# Patient Record
Sex: Male | Born: 1935 | Race: White | Hispanic: No | Marital: Married | Smoking: Never smoker
Health system: Southern US, Community
[De-identification: ages and names within clinical notes are randomized; demographics above are authoritative.]

## PROBLEM LIST (undated history)

## (undated) DIAGNOSIS — I639 Cerebral infarction, unspecified: Secondary | ICD-10-CM

## (undated) DIAGNOSIS — M1990 Unspecified osteoarthritis, unspecified site: Secondary | ICD-10-CM

## (undated) DIAGNOSIS — I10 Essential (primary) hypertension: Secondary | ICD-10-CM

## (undated) DIAGNOSIS — B389 Coccidioidomycosis, unspecified: Secondary | ICD-10-CM

## (undated) DIAGNOSIS — E785 Hyperlipidemia, unspecified: Secondary | ICD-10-CM

## (undated) DIAGNOSIS — H8109 Meniere's disease, unspecified ear: Secondary | ICD-10-CM

## (undated) DIAGNOSIS — N400 Enlarged prostate without lower urinary tract symptoms: Secondary | ICD-10-CM

## (undated) DIAGNOSIS — C4490 Unspecified malignant neoplasm of skin, unspecified: Secondary | ICD-10-CM

## (undated) DIAGNOSIS — K5900 Constipation, unspecified: Secondary | ICD-10-CM

## (undated) DIAGNOSIS — D649 Anemia, unspecified: Secondary | ICD-10-CM

## (undated) DIAGNOSIS — I6529 Occlusion and stenosis of unspecified carotid artery: Secondary | ICD-10-CM

## (undated) HISTORY — DX: Unspecified malignant neoplasm of skin, unspecified: C44.90

## (undated) HISTORY — PX: PROSTATE SURGERY: SHX751

## (undated) HISTORY — DX: Essential (primary) hypertension: I10

## (undated) HISTORY — DX: Unspecified osteoarthritis, unspecified site: M19.90

## (undated) HISTORY — DX: Occlusion and stenosis of unspecified carotid artery: I65.29

## (undated) HISTORY — DX: Meniere's disease, unspecified ear: H81.09

## (undated) HISTORY — DX: Benign prostatic hyperplasia without lower urinary tract symptoms: N40.0

## (undated) HISTORY — DX: Cerebral infarction, unspecified: I63.9

## (undated) HISTORY — PX: CHOLECYSTECTOMY: SHX55

## (undated) HISTORY — DX: Hyperlipidemia, unspecified: E78.5

---

## 2001-02-23 ENCOUNTER — Emergency Department (HOSPITAL_COMMUNITY): Admission: AC | Admit: 2001-02-23 | Discharge: 2001-02-23 | Payer: Self-pay

## 2001-11-09 ENCOUNTER — Ambulatory Visit (HOSPITAL_COMMUNITY): Admission: RE | Admit: 2001-11-09 | Discharge: 2001-11-09 | Payer: Self-pay

## 2002-10-10 HISTORY — PX: BACK SURGERY: SHX140

## 2003-04-17 ENCOUNTER — Ambulatory Visit (HOSPITAL_COMMUNITY): Admission: RE | Admit: 2003-04-17 | Discharge: 2003-04-17 | Payer: Self-pay | Admitting: Gastroenterology

## 2003-04-17 ENCOUNTER — Encounter: Payer: Self-pay | Admitting: Gastroenterology

## 2003-06-30 ENCOUNTER — Encounter: Payer: Self-pay | Admitting: Urology

## 2003-07-02 ENCOUNTER — Encounter (INDEPENDENT_AMBULATORY_CARE_PROVIDER_SITE_OTHER): Payer: Self-pay | Admitting: *Deleted

## 2003-07-02 ENCOUNTER — Encounter: Payer: Self-pay | Admitting: Gastroenterology

## 2003-07-07 ENCOUNTER — Inpatient Hospital Stay (HOSPITAL_COMMUNITY): Admission: RE | Admit: 2003-07-07 | Discharge: 2003-07-11 | Payer: Self-pay | Admitting: Urology

## 2003-07-07 ENCOUNTER — Encounter (INDEPENDENT_AMBULATORY_CARE_PROVIDER_SITE_OTHER): Payer: Self-pay | Admitting: Specialist

## 2003-07-09 ENCOUNTER — Encounter: Payer: Self-pay | Admitting: Internal Medicine

## 2003-08-10 ENCOUNTER — Inpatient Hospital Stay (HOSPITAL_COMMUNITY): Admission: EM | Admit: 2003-08-10 | Discharge: 2003-08-11 | Payer: Self-pay | Admitting: Emergency Medicine

## 2003-11-16 ENCOUNTER — Emergency Department (HOSPITAL_COMMUNITY): Admission: EM | Admit: 2003-11-16 | Discharge: 2003-11-16 | Payer: Self-pay | Admitting: Family Medicine

## 2004-10-03 ENCOUNTER — Inpatient Hospital Stay (HOSPITAL_COMMUNITY): Admission: EM | Admit: 2004-10-03 | Discharge: 2004-10-07 | Payer: Self-pay | Admitting: Emergency Medicine

## 2004-10-05 ENCOUNTER — Encounter (INDEPENDENT_AMBULATORY_CARE_PROVIDER_SITE_OTHER): Payer: Self-pay | Admitting: Cardiology

## 2005-07-02 ENCOUNTER — Emergency Department (HOSPITAL_COMMUNITY): Admission: AC | Admit: 2005-07-02 | Discharge: 2005-07-03 | Payer: Self-pay

## 2005-07-07 ENCOUNTER — Ambulatory Visit: Payer: Self-pay

## 2005-10-21 ENCOUNTER — Ambulatory Visit: Payer: Self-pay

## 2005-10-28 ENCOUNTER — Ambulatory Visit: Payer: Self-pay

## 2008-08-07 ENCOUNTER — Emergency Department (HOSPITAL_COMMUNITY): Admission: EM | Admit: 2008-08-07 | Discharge: 2008-08-07 | Payer: Self-pay | Admitting: Emergency Medicine

## 2009-09-21 ENCOUNTER — Inpatient Hospital Stay (HOSPITAL_COMMUNITY): Admission: EM | Admit: 2009-09-21 | Discharge: 2009-09-28 | Payer: Self-pay | Admitting: Emergency Medicine

## 2009-09-30 ENCOUNTER — Encounter (INDEPENDENT_AMBULATORY_CARE_PROVIDER_SITE_OTHER): Payer: Self-pay | Admitting: *Deleted

## 2009-10-23 ENCOUNTER — Telehealth: Payer: Self-pay | Admitting: Internal Medicine

## 2009-10-28 ENCOUNTER — Ambulatory Visit: Payer: Self-pay | Admitting: Gastroenterology

## 2009-10-28 DIAGNOSIS — Z860100 Personal history of colon polyps, unspecified: Secondary | ICD-10-CM | POA: Insufficient documentation

## 2009-10-29 ENCOUNTER — Telehealth (INDEPENDENT_AMBULATORY_CARE_PROVIDER_SITE_OTHER): Payer: Self-pay | Admitting: *Deleted

## 2009-11-02 ENCOUNTER — Ambulatory Visit: Payer: Self-pay | Admitting: Gastroenterology

## 2010-01-09 ENCOUNTER — Inpatient Hospital Stay (HOSPITAL_COMMUNITY): Admission: EM | Admit: 2010-01-09 | Discharge: 2010-01-13 | Payer: Self-pay | Admitting: Emergency Medicine

## 2010-01-11 ENCOUNTER — Encounter (INDEPENDENT_AMBULATORY_CARE_PROVIDER_SITE_OTHER): Payer: Self-pay | Admitting: Internal Medicine

## 2010-01-11 ENCOUNTER — Ambulatory Visit: Payer: Self-pay | Admitting: Vascular Surgery

## 2010-01-12 ENCOUNTER — Ambulatory Visit: Payer: Self-pay | Admitting: Physical Medicine & Rehabilitation

## 2010-01-13 ENCOUNTER — Ambulatory Visit: Payer: Self-pay | Admitting: Physical Medicine & Rehabilitation

## 2010-01-13 ENCOUNTER — Inpatient Hospital Stay (HOSPITAL_COMMUNITY)
Admission: RE | Admit: 2010-01-13 | Discharge: 2010-01-26 | Payer: Self-pay | Admitting: Physical Medicine & Rehabilitation

## 2010-02-19 ENCOUNTER — Encounter
Admission: RE | Admit: 2010-02-19 | Discharge: 2010-05-13 | Payer: Self-pay | Admitting: Physical Medicine & Rehabilitation

## 2010-02-23 ENCOUNTER — Ambulatory Visit: Payer: Self-pay | Admitting: Physical Medicine & Rehabilitation

## 2010-02-23 ENCOUNTER — Ambulatory Visit (HOSPITAL_COMMUNITY)
Admission: RE | Admit: 2010-02-23 | Discharge: 2010-02-23 | Payer: Self-pay | Admitting: Physical Medicine & Rehabilitation

## 2010-03-12 ENCOUNTER — Ambulatory Visit: Payer: Self-pay | Admitting: Physical Medicine & Rehabilitation

## 2010-05-13 ENCOUNTER — Ambulatory Visit: Payer: Self-pay | Admitting: Physical Medicine & Rehabilitation

## 2010-10-25 ENCOUNTER — Encounter
Admission: RE | Admit: 2010-10-25 | Discharge: 2010-11-09 | Payer: Self-pay | Source: Home / Self Care | Attending: Physical Medicine & Rehabilitation | Admitting: Physical Medicine & Rehabilitation

## 2010-10-26 ENCOUNTER — Ambulatory Visit
Admission: RE | Admit: 2010-10-26 | Discharge: 2010-10-26 | Payer: Self-pay | Source: Home / Self Care | Attending: Physical Medicine & Rehabilitation | Admitting: Physical Medicine & Rehabilitation

## 2010-11-03 ENCOUNTER — Encounter
Admission: RE | Admit: 2010-11-03 | Discharge: 2010-11-09 | Payer: Self-pay | Source: Home / Self Care | Attending: Physical Medicine & Rehabilitation | Admitting: Physical Medicine & Rehabilitation

## 2010-11-11 NOTE — Procedures (Signed)
Summary: Colon   Colonoscopy  Procedure date:  07/02/2003  Findings:      Location:  [facility].   Patient Name: Gregory Padilla, Gregory Padilla MRN:  Procedure Procedures: Colonoscopy CPT: [phone].    with Hot Biopsy(s)CPT: [id number].    with polypectomy. CPT: A3573898.  Personnel: Endoscopist: Ulyess Mort, MD.  Referred By: Loma Sender, MD.  Exam Location: Exam performed in Outpatient Clinic. Outpatient  Patient Consent: Procedure, Alternatives, Risks and Benefits discussed, consent obtained, from patient. Consent was obtained by the RN.  Indications  Surveillance of: Adenomatous Polyp(s).  History  Pre-Exam Physical: Entire physical exam was normal.  Exam Exam: Extent of exam reached: Cecum, extent intended: Cecum.  The cecum was identified by appendiceal orifice and IC valve. Colon retroflexion performed. Images were not taken. ASA Classification: II. Tolerance: good.  Monitoring: Pulse and BP monitoring, Oximetry used. Supplemental O2 given.  Colon Prep Prep results: fair, adequate exam.  Sedation Meds: Patient assessed and found to be appropriate for moderate (conscious) sedation. Fentanyl 50 mcg. given IV. Versed 5 mg. given IV.  Findings - DIVERTICULOSIS: Ascending Colon to Sigmoid Colon. ICD9: Diverticulosis: 562.10.  POLYP: Ascending Colon, Maximum size: 6 mm. sessile polyp. Procedure:  hot biopsy, removed, retrieved, Polyp sent to pathology. ICD9: Colon Polyps: 211.3.  - HEMORRHOIDS: Internal and External. Size: Grade II. ICD9: Hemorrhoids, Internal and  External: 455.6.   Assessment Abnormal examination, see findings above.  Diagnoses: 211.3: Colon Polyps.  562.10: Diverticulosis.  455.6: Hemorrhoids, Internal and  External.   Events  Unplanned Interventions: No intervention was required.  Unplanned Events: There were no complications. Plans Patient Education: Patient given standard instructions for: Polyps. Diverticulosis. Yearly  hemoccult testing recommended. Patient instructed to get routine colonoscopy every 4 years.  Disposition: After procedure patient sent to recovery. After recovery patient sent home.

## 2010-11-11 NOTE — Progress Notes (Signed)
Summary: Triage  Phone Note Call from Patient Call back at Home Phone [phone]   Caller: Patient Call For: Dr. Marina Goodell Reason for Call: Talk to Nurse Summary of Call: Pt is needing to schedule a Hospital f/u with Dr. Marina Goodell ASAP Initial call taken by: Karna Christmas,  October 23, 2009 11:34 AM  Follow-up for Phone Call        Discussed with Dr.Jasneet Schobert and he has never seen pt. so he should keep scheduled appt. with Dr.Jacobs. Christy the front office scheduler will contact the pt. Follow-up by: Teryl Lucy RN,  October 23, 2009 3:04 PM

## 2010-11-11 NOTE — Procedures (Signed)
Summary: Colonoscopy  Patient: [name] Note: All result statuses are Final unless otherwise noted.  Tests: (1) Colonoscopy (COL)   COL Colonoscopy           DONE     [facility]     [address]     [place], [place]  [address]           COLONOSCOPY PROCEDURE REPORT           PATIENT:  Gregory Padilla, Gregory Padilla  MR#:  [id number]     BIRTHDATE:  Jul 07, 1925, 84 yrs. old  GENDER:  male           ENDOSCOPIST:  Rachael Fee, MD     Referred by:  Loma Sender, MD           PROCEDURE DATE:  11/02/2009     PROCEDURE:  Colonoscopy, Diagnostic     ASA CLASS:  Class II     INDICATIONS:  chronic constipation, personal history of     precancerous colon polyps, recently admitted with partial SBO     (Obstipation?)           MEDICATIONS:   Fentanyl 50 mcg IV, Versed 5 mg IV           DESCRIPTION OF PROCEDURE:   After the risks benefits and     alternatives of the procedure were thoroughly explained, informed     consent was obtained.  Digital rectal exam was performed and     revealed no rectal masses.   The LB PCF-H180AL [id number] endoscope     was introduced through the anus and advanced to the cecum, which     was identified by both the appendix and ileocecal valve, without     limitations.  The quality of the prep was adequate, using     MoviPrep.  The instrument was then slowly withdrawn as the colon     was fully examined.     <<PROCEDUREIMAGES>>           FINDINGS:  Moderate diverticulosis was found throughout the colon.     This was most significant in left colon, associated with some     mucusal edema and mild erythema (see image2).  Internal     hemorrhoids were found. These were small, non-thrombosed. There     was a small internal anal skin tag as well (see image5).  This was     otherwise a normal examination of the colon (see image3 and     image4).   Retroflexed views in the rectum revealed no     abnormalities.    The scope was then withdrawn from the  patient     and the procedure completed.           COMPLICATIONS:  None           ENDOSCOPIC IMPRESSION:     1) Moderate diverticulosis throughout the colon     2) Internal hemorrhoids     3) Otherwise normal examination; no polyps or cancers           RECOMMENDATIONS:     1) Return to the care of your primary provider. GI follow up as     needed     2) Continue miralax, metamucil, prune juice to keep you bowels     moving easier.           REPEAT EXAM:  does not need routine colonoscopy for polyp  surveillance or cancer screening.           ______________________________     Rachael Fee, MD           n.     eSIGNED:   Rachael Fee at 11/02/2009 09:39 AM           Deborah Chalk, [id number]  Note: An exclamation mark (!) indicates a result that was not dispersed into the flowsheet. Document Creation Date: 11/02/2009 9:39 AM _______________________________________________________________________  (1) Order result status: Final Collection or observation date-time: 11/02/2009 09:32 Requested date-time:  Receipt date-time:  Reported date-time:  Referring Physician:   Ordering Physician: Rob Bunting [phone]) Specimen Source:  Source: Launa Grill Order Number: [id number] Lab site:

## 2010-11-11 NOTE — Procedures (Signed)
Summary: EGD   EGD  Procedure date:  07/02/2003  Findings:      Location: [facility]    EGD  Procedure date:  07/02/2003  Findings:      Location: [facility]   Patient Name: Masiyah, Engen MRN:  Procedure Procedures: Panendoscopy (EGD) CPT: 43235.    with biopsy(s)/brushing(s). CPT: [id number].  Personnel: Endoscopist: Ulyess Mort, MD.  Referred By: Loma Sender, MD.  Exam Location: Exam performed in Outpatient Clinic. Outpatient  Patient Consent: Procedure, Alternatives, Risks and Benefits discussed, consent obtained, from patient. Consent was obtained by the RN.  Indications Symptoms: Abdominal pain, Reflux symptoms  History  Pre-Exam Physical: Entire physical exam was normal.  Exam Exam Info: Maximum depth of insertion Duodenum, intended Duodenum. Images were not taken. ASA Classification: II.  Sedation Meds: Patient assessed and found to be appropriate for moderate (conscious) sedation.  Monitoring: BP and pulse monitoring done. Oximetry used. Supplemental O2 given  Findings - ANATOMICAL DEFORMITY: Distal Esophagus. mild presbyesophagous.  - MUCOSAL ABNORMALITY: Antrum to Jejunum. Erythematous mucosa. Granular mucosa.  - MUCOSAL ABNORMALITY: Fundus to Antrum. Erythematous mucosa. Granular mucosa. Biopsy/Mucosal Abn taken. RUT done, results pending. ICD9: Gastritis, Chronic: 535.10. Comment: multiple gastric polyps bx,s =5.   Assessment Abnormal examination, see findings above.  Diagnoses: 535.10: Gastritis, Chronic.   Events  Unplanned Intervention: No unplanned interventions were required.  Unplanned Events: There were no complications. Plans Medication(s): Continue current medications.  Patient Education: Patient given standard instructions for: Reflux. Mucosal Abnormality.  Disposition: After procedure patient sent to recovery. After recovery patient sent home.

## 2010-11-11 NOTE — Assessment & Plan Note (Signed)
History of Present Illness Visit Type: Initial Visit Primary GI MD: Rob Bunting MD Primary Provider: Adelfa Koh Requesting Provider: n/a Chief Complaint: Hospital F/U, Pt is doing better now History of Present Illness:     very pleasant 77 year old man who was recently discharged from [facility] after 7 day stay for partial small bowel obstruction.  plain films and CAT scan showed no clear transition point but it was noted that he was fairly constipated. His partial blockage resolved with conservative measures, NG tube suction gradually over one week's time. He did not require surgical intervention.  since discharge he has felt back to his usual which is chronic constipation with chronic intermittent right lower quadrant pains. has right lower quadrant discomfort  for years, still has it.  usually relieved with BM.  overall his weigth is stable for past year.  no post prandial symptoms unless he "overeats".    takes metamucil twice a day.  20 oz prune juice daily.  on this regmine he controls his bowels pretty well.  Will need miralax periodically as well.  he has seen no blood in his stool he is quite hard of hearing and his wife helps with his history.  she had a colonoscopy in 2004 by Dr. Victorino Dike, the indications were for "history of adenomatous polyps" be removed at least one small polyp at that time and this was adenomatous by pathology.           Current Medications (verified): 1)  Alprazolam 0.25 Mg Tabs (Alprazolam) .... As Needed 2)  Flomax 0.4 Mg Caps (Tamsulosin Hcl) .Marland Kitchen.. 1 Two Times A Day 3)  Nexium 40 Mg Cpdr (Esomeprazole Magnesium) .Marland Kitchen.. 1 By Mouth Once Daily 4)  Dicyclomine Hcl 10 Mg Caps (Dicyclomine Hcl) .Marland Kitchen.. 1 -3 Daily As Needed 5)  Guaifenex Pse/1200sa .... For Allergies 1 Two Times A Day As Needed 6)  Crestor 5 Mg Tabs (Rosuvastatin Calcium) .Marland Kitchen.. 1 By Mouth Once Daily 7)  Aggrenox 25-200 Mg Xr12h-Cap (Aspirin-Dipyridamole) .Marland Kitchen.. 1 By Mouth Two  Times A Day 8)  Udamin Sp  Tabs (Specialty Vitamins Products) .Marland Kitchen.. 1 By Mouth Two Times A Day 9)  Gelnique 10 % Gel (Oxybutynin Chloride) .... Every Am  Allergies (verified): No Known Drug Allergies  Past History:  Past Medical History: Adenomatous Colon Polyps Arthritis Diabetes Hyperlipidemia GERD Peptic Ulcer Disease Hypertension Benign Prostatic hypertrophy Irritable Bowel Syndrome Diverticulitis Lactose Intolerance   Past Surgical History: Cholecystectomy in 10s  Family History: mother had pancreatic cancer  Social History: he is married, he has 2 children, he is retired, he does not smoke cigarettes drink alcohol or caffeine  Review of Systems       Pertinent positive and negative review of systems were noted in the above HPI and GI specific review of systems.  All other review of systems was otherwise negative.   Vital Signs:  Patient profile:   77 year old male Height:      71 inches Weight:      177 pounds BMI:     24.78 BSA:     2.00 Pulse rate:   80 / minute Pulse rhythm:   regular BP sitting:   110 / 62  (left arm)  Vitals Entered By: Merri Ray CMA Duncan Dull) (October 28, 2009 9:01 AM)  Physical Exam  Additional Exam:  Constitutional: generally well appearing Psychiatric: alert and oriented times 3 Eyes: extraocular movements intact Mouth: oropharynx moist, no lesions Neck: supple, no lymphadenopathy Cardiovascular: heart regular rate and  rythm Lungs: CTA bilaterally Abdomen: soft, non-tender, non-distended, no obvious ascites, no peritoneal signs, normal bowel sounds Extremities: no lower extremity edema bilaterally Skin: no lesions on visible extremities    Impression & Recommendations:  Problem # 1:  chronic constipation, intermittent right lower quadrant pains, recent resolved partial small bowel obstruction he has had cholecystectomy in the past and so perhaps adhesions from that surgery caused his partial small bowel  obstruction. He is also chronically constipated and that can certainly cause an ileus, obstipation type situation which can lead to small bowel dilation. He has a history of precancerous colon polyps I think repeating colonoscopy at this point is reasonable. We will arrange for this to be done at his soonest convenience. Otherwise he will continue on his daily Metamucil, prune juice, intermittent MiraLax regimen and he is been doing for many years with fairly good results.  Patient Instructions: 1)  You will be scheduled to have a colonoscopy. OK to stay on aggrenox prior to the procedure. 2)  Continue metamucil, prune juice, miralax as needed. 3)  A copy of this information will be sent to Dr. Loma Sender. 4)  The medication list was reviewed and reconciled.  All changed / newly prescribed medications were explained.  A complete medication list was provided to the patient / caregiver.  Appended Document: Orders Update/movi    Clinical Lists Changes  Problems: Added new problem of PERSONAL HISTORY OF COLONIC POLYPS (ICD-V12.72) Medications: Added new medication of MOVIPREP 100 GM  SOLR (PEG-KCL-NACL-NASULF-NA ASC-C) As per prep instructions. - Signed Rx of MOVIPREP 100 GM  SOLR (PEG-KCL-NACL-NASULF-NA ASC-C) As per prep instructions.;  #1 x 0;  Signed;  Entered by: Chales Abrahams CMA (AAMA);  Authorized by: Rachael Fee MD;  Method used: Electronically to [facility]. [facility] [phone]*, [address], [place], [place]  [id number], Ph: [phone], Fax: [phone] Orders: Added new Test order of Colonoscopy (Colon) - Signed    Prescriptions: MOVIPREP 100 GM  SOLR (PEG-KCL-NACL-NASULF-NA ASC-C) As per prep instructions.  #1 x 0   Entered by:   Chales Abrahams CMA (AAMA)   Authorized by:   Rachael Fee MD   Signed by:   Chales Abrahams CMA (AAMA) on 10/28/2009   Method used:   Electronically to        [facility]. [address] [phone]* (retail)       [address] [place],  [place]  [id number]       Ph: [phone]       Fax: [phone]   RxID:   [id number]

## 2010-11-11 NOTE — Progress Notes (Signed)
Summary: Colon time change  Phone Note Outgoing Call Call back at [facility] Phone [phone]   Call placed by: Chales Abrahams CMA Duncan Dull),  October 29, 2009 2:27 PM Summary of Call: called and had pt appt moved up to 930 on Mon and corrected instructions with the pt wife she verbally understood the changes. Initial call taken by: Chales Abrahams CMA Duncan Dull),  October 29, 2009 2:28 PM

## 2010-11-11 NOTE — Letter (Signed)
Summary: [facility] Instructions  [facility]  [address] [place], [place] [address]   Phone: [phone]  Fax: [phone]       Gregory Padilla    1924-10-15    MRN: [id number]        Procedure Day /Date:11/02/09     Arrival Time:1230 pm     Procedure Time:1:30 pm     Location of Procedure:                    X  [facility] (4th Floor)                        PREPARATION FOR COLONOSCOPY WITH MOVIPREP   Starting 5 days prior to your procedure TODAY do not eat nuts, seeds, popcorn, corn, beans, peas,  salads, or any raw vegetables.  Do not take any fiber supplements (e.g. Metamucil, Citrucel, and Benefiber).  THE DAY BEFORE YOUR PROCEDURE         DATE: 11/01/09  DAY: SUN  1.  Drink clear liquids the entire day-NO SOLID FOOD  2.  Do not drink anything colored red or purple.  Avoid juices with pulp.  No orange juice.  3.  Drink at least 64 oz. (8 glasses) of fluid/clear liquids during the day to prevent dehydration and help the prep work efficiently.  CLEAR LIQUIDS INCLUDE: Water Jello Ice Popsicles Tea (sugar ok, no milk/cream) Powdered fruit flavored drinks Coffee (sugar ok, no milk/cream) Gatorade Juice: apple, white grape, white cranberry  Lemonade Clear bullion, consomm, broth Carbonated beverages (any kind) Strained chicken noodle soup Hard Candy                             4.  In the morning, mix first dose of MoviPrep solution:    Empty 1 Pouch A and 1 Pouch B into the disposable container    Add lukewarm drinking water to the top line of the container. Mix to dissolve    Refrigerate (mixed solution should be used within 24 hrs)  5.  Begin drinking the prep at 5:00 p.m. The MoviPrep container is divided by 4 marks.   Every 15 minutes drink the solution down to the next mark (approximately 8 oz) until the full liter is complete.   6.  Follow completed prep with 16 oz of clear liquid of your choice (Nothing red or purple).   Continue to drink clear liquids until bedtime.  7.  Before going to bed, mix second dose of MoviPrep solution:    Empty 1 Pouch A and 1 Pouch B into the disposable container    Add lukewarm drinking water to the top line of the container. Mix to dissolve    Refrigerate  THE DAY OF YOUR PROCEDURE      DATE: 11/02/09 DAY: MON  Beginning at 830 a.m. (5 hours before procedure):         1. Every 15 minutes, drink the solution down to the next mark (approx 8 oz) until the full liter is complete.  2. Follow completed prep with 16 oz. of clear liquid of your choice.    3. You may drink clear liquids until 1130 am(2 HOURS BEFORE PROCEDURE).   MEDICATION INSTRUCTIONS  Unless otherwise instructed, you should take regular prescription medications with a small sip of water   as early as possible the morning of your procedure.    STAY  Aggrenox on per Dr Christella Hartigan           OTHER INSTRUCTIONS  You will need a responsible adult at least 75 years of age to accompany you and drive you home.   This person must remain in the waiting room during your procedure.  Wear loose fitting clothing that is easily removed.  Leave jewelry and other valuables at home.  However, you may wish to bring a book to read or  an iPod/MP3 player to listen to music as you wait for your procedure to start.  Remove all body piercing jewelry and leave at home.  Total time from sign-in until discharge is approximately 2-3 hours.  You should go home directly after your procedure and rest.  You can resume normal activities the  day after your procedure.  The day of your procedure you should not:   Drive   Make legal decisions   Operate machinery   Drink alcohol   Return to work  You will receive specific instructions about eating, activities and medications before you leave.    The above instructions have been reviewed and explained to me by   _______________________    I fully understand and can  verbalize these instructions _____________________________ Date _________

## 2010-11-12 ENCOUNTER — Ambulatory Visit
Payer: MEDICARE | Attending: Physical Medicine & Rehabilitation | Admitting: Rehabilitative and Restorative Service Providers"

## 2010-11-12 DIAGNOSIS — I69998 Other sequelae following unspecified cerebrovascular disease: Secondary | ICD-10-CM | POA: Insufficient documentation

## 2010-11-12 DIAGNOSIS — IMO0001 Reserved for inherently not codable concepts without codable children: Secondary | ICD-10-CM | POA: Insufficient documentation

## 2010-11-12 DIAGNOSIS — R269 Unspecified abnormalities of gait and mobility: Secondary | ICD-10-CM | POA: Insufficient documentation

## 2010-11-15 ENCOUNTER — Ambulatory Visit: Payer: MEDICARE | Admitting: Rehabilitative and Restorative Service Providers"

## 2010-11-17 ENCOUNTER — Ambulatory Visit: Payer: MEDICARE | Admitting: Rehabilitative and Restorative Service Providers"

## 2010-11-23 ENCOUNTER — Encounter: Payer: Self-pay | Admitting: Rehabilitative and Restorative Service Providers"

## 2010-11-26 ENCOUNTER — Ambulatory Visit: Payer: MEDICARE | Admitting: Rehabilitative and Restorative Service Providers"

## 2010-11-30 ENCOUNTER — Ambulatory Visit: Payer: MEDICARE | Admitting: Rehabilitative and Restorative Service Providers"

## 2010-12-03 ENCOUNTER — Ambulatory Visit: Payer: MEDICARE | Admitting: Rehabilitative and Restorative Service Providers"

## 2010-12-07 ENCOUNTER — Ambulatory Visit: Payer: MEDICARE | Admitting: Rehabilitative and Restorative Service Providers"

## 2010-12-13 ENCOUNTER — Ambulatory Visit
Payer: MEDICARE | Attending: Physical Medicine & Rehabilitation | Admitting: Rehabilitative and Restorative Service Providers"

## 2010-12-13 DIAGNOSIS — IMO0001 Reserved for inherently not codable concepts without codable children: Secondary | ICD-10-CM | POA: Insufficient documentation

## 2010-12-13 DIAGNOSIS — R269 Unspecified abnormalities of gait and mobility: Secondary | ICD-10-CM | POA: Insufficient documentation

## 2010-12-13 DIAGNOSIS — I69998 Other sequelae following unspecified cerebrovascular disease: Secondary | ICD-10-CM | POA: Insufficient documentation

## 2010-12-16 ENCOUNTER — Encounter: Payer: MEDICARE | Admitting: Rehabilitative and Restorative Service Providers"

## 2010-12-28 ENCOUNTER — Ambulatory Visit: Payer: Self-pay | Admitting: Physical Medicine & Rehabilitation

## 2010-12-28 LAB — GLUCOSE, CAPILLARY
Glucose-Capillary: 125 mg/dL — ABNORMAL HIGH (ref 70–99)
Glucose-Capillary: 126 mg/dL — ABNORMAL HIGH (ref 70–99)
Glucose-Capillary: 134 mg/dL — ABNORMAL HIGH (ref 70–99)
Glucose-Capillary: 134 mg/dL — ABNORMAL HIGH (ref 70–99)
Glucose-Capillary: 139 mg/dL — ABNORMAL HIGH (ref 70–99)
Glucose-Capillary: 140 mg/dL — ABNORMAL HIGH (ref 70–99)
Glucose-Capillary: 141 mg/dL — ABNORMAL HIGH (ref 70–99)
Glucose-Capillary: 145 mg/dL — ABNORMAL HIGH (ref 70–99)
Glucose-Capillary: 159 mg/dL — ABNORMAL HIGH (ref 70–99)
Glucose-Capillary: 170 mg/dL — ABNORMAL HIGH (ref 70–99)
Glucose-Capillary: 176 mg/dL — ABNORMAL HIGH (ref 70–99)

## 2010-12-29 LAB — DIFFERENTIAL
Basophils Absolute: 0 10*3/uL (ref 0.0–0.1)
Basophils Absolute: 0 10*3/uL (ref 0.0–0.1)
Basophils Relative: 0 % (ref 0–1)
Basophils Relative: 0 % (ref 0–1)
Eosinophils Absolute: 0.2 10*3/uL (ref 0.0–0.7)
Eosinophils Absolute: 0.3 10*3/uL (ref 0.0–0.7)
Eosinophils Relative: 3 % (ref 0–5)
Lymphocytes Relative: 20 % (ref 12–46)
Lymphs Abs: 1.9 10*3/uL (ref 0.7–4.0)
Monocytes Absolute: 1.3 10*3/uL — ABNORMAL HIGH (ref 0.1–1.0)
Neutrophils Relative %: 65 % (ref 43–77)

## 2010-12-29 LAB — GLUCOSE, CAPILLARY
Glucose-Capillary: 114 mg/dL — ABNORMAL HIGH (ref 70–99)
Glucose-Capillary: 117 mg/dL — ABNORMAL HIGH (ref 70–99)
Glucose-Capillary: 128 mg/dL — ABNORMAL HIGH (ref 70–99)
Glucose-Capillary: 128 mg/dL — ABNORMAL HIGH (ref 70–99)
Glucose-Capillary: 129 mg/dL — ABNORMAL HIGH (ref 70–99)
Glucose-Capillary: 135 mg/dL — ABNORMAL HIGH (ref 70–99)
Glucose-Capillary: 139 mg/dL — ABNORMAL HIGH (ref 70–99)
Glucose-Capillary: 142 mg/dL — ABNORMAL HIGH (ref 70–99)
Glucose-Capillary: 143 mg/dL — ABNORMAL HIGH (ref 70–99)
Glucose-Capillary: 148 mg/dL — ABNORMAL HIGH (ref 70–99)
Glucose-Capillary: 148 mg/dL — ABNORMAL HIGH (ref 70–99)
Glucose-Capillary: 149 mg/dL — ABNORMAL HIGH (ref 70–99)
Glucose-Capillary: 149 mg/dL — ABNORMAL HIGH (ref 70–99)
Glucose-Capillary: 151 mg/dL — ABNORMAL HIGH (ref 70–99)
Glucose-Capillary: 152 mg/dL — ABNORMAL HIGH (ref 70–99)
Glucose-Capillary: 163 mg/dL — ABNORMAL HIGH (ref 70–99)
Glucose-Capillary: 168 mg/dL — ABNORMAL HIGH (ref 70–99)
Glucose-Capillary: 168 mg/dL — ABNORMAL HIGH (ref 70–99)
Glucose-Capillary: 169 mg/dL — ABNORMAL HIGH (ref 70–99)
Glucose-Capillary: 180 mg/dL — ABNORMAL HIGH (ref 70–99)
Glucose-Capillary: 192 mg/dL — ABNORMAL HIGH (ref 70–99)
Glucose-Capillary: 193 mg/dL — ABNORMAL HIGH (ref 70–99)
Glucose-Capillary: 214 mg/dL — ABNORMAL HIGH (ref 70–99)

## 2010-12-29 LAB — PROTEIN C ACTIVITY: Protein C Activity: 148 % — ABNORMAL HIGH (ref 75–133)

## 2010-12-29 LAB — BASIC METABOLIC PANEL
BUN: 10 mg/dL (ref 6–23)
BUN: 8 mg/dL (ref 6–23)
Calcium: 9.2 mg/dL (ref 8.4–10.5)
Chloride: 104 mEq/L (ref 96–112)
Chloride: 105 mEq/L (ref 96–112)
Creatinine, Ser: 1.14 mg/dL (ref 0.4–1.5)
Creatinine, Ser: 1.18 mg/dL (ref 0.4–1.5)
Glucose, Bld: 91 mg/dL (ref 70–99)

## 2010-12-29 LAB — COMPREHENSIVE METABOLIC PANEL
ALT: 18 U/L (ref 0–53)
AST: 23 U/L (ref 0–37)
Albumin: 3.7 g/dL (ref 3.5–5.2)
Alkaline Phosphatase: 53 U/L (ref 39–117)
BUN: 10 mg/dL (ref 6–23)
GFR calc non Af Amer: >60 mL/min (ref >60)
Glucose, Bld: 148 mg/dL — ABNORMAL HIGH (ref 70–99)
Total Bilirubin: 1 mg/dL (ref 0.3–1.2)

## 2010-12-29 LAB — URINALYSIS, ROUTINE W REFLEX MICROSCOPIC
Bilirubin Urine: NEGATIVE
Bilirubin Urine: NEGATIVE
Hgb urine dipstick: NEGATIVE
Hgb urine dipstick: NEGATIVE
Ketones, ur: NEGATIVE mg/dL
Ketones, ur: NEGATIVE mg/dL
Nitrite: NEGATIVE
Protein, ur: NEGATIVE mg/dL
Specific Gravity, Urine: 1.008 (ref 1.005–1.030)
Specific Gravity, Urine: 1.017 (ref 1.005–1.030)
Urobilinogen, UA: 0.2 mg/dL (ref 0.0–1.0)
Urobilinogen, UA: 0.2 mg/dL (ref 0.0–1.0)
pH: 6 (ref 5.0–8.0)

## 2010-12-29 LAB — CBC
HCT: 37.8 % — ABNORMAL LOW (ref 39.0–52.0)
Hemoglobin: 12.8 g/dL — ABNORMAL LOW (ref 13.0–17.0)
MCHC: 33.9 g/dL (ref 30.0–36.0)
MCV: 98.3 fL (ref 78.0–100.0)
MCV: 98.5 fL (ref 78.0–100.0)
Platelets: 185 10*3/uL (ref 150–400)
Platelets: 222 10*3/uL (ref 150–400)
RDW: 12.2 % (ref 11.5–15.5)
RDW: 12.2 % (ref 11.5–15.5)
WBC: 9.1 10*3/uL (ref 4.0–10.5)
WBC: 9.8 10*3/uL (ref 4.0–10.5)

## 2010-12-29 LAB — URINE CULTURE: Colony Count: 50000

## 2010-12-29 LAB — HEPATIC FUNCTION PANEL
ALT: 23 U/L (ref 0–53)
AST: 27 U/L (ref 0–37)
Albumin: 3.9 g/dL (ref 3.5–5.2)
Total Protein: 6.5 g/dL (ref 6.0–8.3)

## 2010-12-29 LAB — APTT
aPTT: 32 seconds (ref 24–37)
aPTT: 35 seconds (ref 24–37)

## 2010-12-29 LAB — LUPUS ANTICOAGULANT PANEL
DRVVT: 41.2 secs (ref 36.2–44.3)
PTT Lupus Anticoagulant: 40.2 secs (ref 32.0–43.4)

## 2010-12-29 LAB — CARDIOLIPIN ANTIBODIES, IGG, IGM, IGA
Anticardiolipin IgA: 0 APL U/mL — ABNORMAL LOW (ref <22)
Anticardiolipin IgM: 65 MPL U/mL — ABNORMAL HIGH (ref <11)

## 2010-12-29 LAB — PROTIME-INR
INR: 1.03 (ref 0.00–1.49)
Prothrombin Time: 13.4 seconds (ref 11.6–15.2)

## 2010-12-29 LAB — PROTEIN C, TOTAL: Protein C, Total: 105 % (ref 70–140)

## 2010-12-29 LAB — LIPID PANEL
HDL: 60 mg/dL (ref >39)
LDL Cholesterol: 67 mg/dL (ref 0–99)
Triglycerides: 75 mg/dL (ref <150)
VLDL: 15 mg/dL (ref 0–40)

## 2010-12-29 LAB — PROTHROMBIN GENE MUTATION

## 2010-12-29 LAB — ANA: Anti Nuclear Antibody(ANA): NEGATIVE

## 2010-12-29 LAB — HOMOCYSTEINE: Homocysteine: 8.1 umol/L (ref 4.0–15.4)

## 2010-12-29 LAB — URINE MICROSCOPIC-ADD ON

## 2010-12-29 LAB — SEDIMENTATION RATE: Sed Rate: 3 mm/hr (ref 0–16)

## 2010-12-29 LAB — VITAMIN B12: Vitamin B-12: 1253 pg/mL — ABNORMAL HIGH (ref 211–911)

## 2010-12-29 LAB — ANTITHROMBIN III: AntiThromb III Func: 88 % (ref 76–126)

## 2011-01-07 ENCOUNTER — Ambulatory Visit: Payer: MEDICARE | Admitting: Physical Medicine & Rehabilitation

## 2011-01-07 ENCOUNTER — Encounter: Payer: MEDICARE | Attending: Physical Medicine & Rehabilitation

## 2011-01-07 DIAGNOSIS — H8110 Benign paroxysmal vertigo, unspecified ear: Secondary | ICD-10-CM | POA: Insufficient documentation

## 2011-01-07 DIAGNOSIS — R269 Unspecified abnormalities of gait and mobility: Secondary | ICD-10-CM | POA: Insufficient documentation

## 2011-01-07 DIAGNOSIS — M2240 Chondromalacia patellae, unspecified knee: Secondary | ICD-10-CM | POA: Insufficient documentation

## 2011-01-07 DIAGNOSIS — I6330 Cerebral infarction due to thrombosis of unspecified cerebral artery: Secondary | ICD-10-CM

## 2011-01-07 DIAGNOSIS — M25569 Pain in unspecified knee: Secondary | ICD-10-CM | POA: Insufficient documentation

## 2011-01-07 DIAGNOSIS — Z8673 Personal history of transient ischemic attack (TIA), and cerebral infarction without residual deficits: Secondary | ICD-10-CM | POA: Insufficient documentation

## 2011-01-10 LAB — CBC
MCV: 98.5 fL (ref 78.0–100.0)
RBC: 3.51 MIL/uL — ABNORMAL LOW (ref 4.22–5.81)
WBC: 8.2 10*3/uL (ref 4.0–10.5)

## 2011-01-10 LAB — BASIC METABOLIC PANEL
Calcium: 8.8 mg/dL (ref 8.4–10.5)
Chloride: 103 mEq/L (ref 96–112)
Creatinine, Ser: 1.13 mg/dL (ref 0.4–1.5)
GFR calc Af Amer: >60 mL/min (ref >60)

## 2011-01-10 LAB — MAGNESIUM: Magnesium: 2.7 mg/dL — ABNORMAL HIGH (ref 1.5–2.5)

## 2011-01-11 LAB — CBC
HCT: 39.3 % (ref 39.0–52.0)
Hemoglobin: 12 g/dL — ABNORMAL LOW (ref 13.0–17.0)
Hemoglobin: 13.4 g/dL (ref 13.0–17.0)
MCHC: 33.8 g/dL (ref 30.0–36.0)
MCV: 98.5 fL (ref 78.0–100.0)
MCV: 98.7 fL (ref 78.0–100.0)
Platelets: 176 10*3/uL (ref 150–400)
Platelets: 195 10*3/uL (ref 150–400)
RBC: 4 MIL/uL — ABNORMAL LOW (ref 4.22–5.81)
RDW: 11.9 % (ref 11.5–15.5)
WBC: 10.6 10*3/uL — ABNORMAL HIGH (ref 4.0–10.5)
WBC: 12 10*3/uL — ABNORMAL HIGH (ref 4.0–10.5)

## 2011-01-11 LAB — BASIC METABOLIC PANEL
Calcium: 9 mg/dL (ref 8.4–10.5)
Chloride: 95 mEq/L — ABNORMAL LOW (ref 96–112)
GFR calc Af Amer: >60 mL/min (ref >60)
GFR calc non Af Amer: 52 mL/min — ABNORMAL LOW (ref >60)
Glucose, Bld: 108 mg/dL — ABNORMAL HIGH (ref 70–99)
Potassium: 3.6 mEq/L (ref 3.5–5.1)
Potassium: 4.4 mEq/L (ref 3.5–5.1)
Sodium: 135 mEq/L (ref 135–145)

## 2011-01-11 LAB — COMPREHENSIVE METABOLIC PANEL
AST: 26 U/L (ref 0–37)
Albumin: 3.7 g/dL (ref 3.5–5.2)
CO2: 26 mEq/L (ref 19–32)
Calcium: 9.4 mg/dL (ref 8.4–10.5)
Creatinine, Ser: 1.21 mg/dL (ref 0.4–1.5)
GFR calc Af Amer: >60 mL/min (ref >60)
GFR calc non Af Amer: 57 mL/min — ABNORMAL LOW (ref >60)

## 2011-01-11 LAB — DIFFERENTIAL
Eosinophils Absolute: 0.1 10*3/uL (ref 0.0–0.7)
Eosinophils Relative: 1 % (ref 0–5)
Lymphs Abs: 1.2 10*3/uL (ref 0.7–4.0)
Monocytes Relative: 11 % (ref 3–12)

## 2011-01-11 LAB — PHOSPHORUS: Phosphorus: 2.7 mg/dL (ref 2.3–4.6)

## 2011-01-17 ENCOUNTER — Ambulatory Visit
Payer: MEDICARE | Attending: Physical Medicine & Rehabilitation | Admitting: Rehabilitative and Restorative Service Providers"

## 2011-01-17 DIAGNOSIS — IMO0001 Reserved for inherently not codable concepts without codable children: Secondary | ICD-10-CM | POA: Insufficient documentation

## 2011-01-17 DIAGNOSIS — R269 Unspecified abnormalities of gait and mobility: Secondary | ICD-10-CM | POA: Insufficient documentation

## 2011-01-17 DIAGNOSIS — I69998 Other sequelae following unspecified cerebrovascular disease: Secondary | ICD-10-CM | POA: Insufficient documentation

## 2011-02-25 NOTE — Op Note (Signed)
NAME:  Gregory Padilla, Gregory Padilla                      ACCOUNT NO.:  [id number]   MEDICAL RECORD NO.:  [id number]                   PATIENT TYPE:  AMB   LOCATION:  DAY                                  FACILITY:  [facility]   PHYSICIAN:  Sigmund I. Patsi Sears, M.D.         DATE OF BIRTH:  [birth year]   DATE OF PROCEDURE:  07/07/2003  DATE OF DISCHARGE:                                 OPERATIVE REPORT   PREOPERATIVE DIAGNOSIS:  Benign prostatic hyperplasia/bladder outlet  obstruction.   POSTOPERATIVE DIAGNOSIS:  Benign prostatic hyperplasia/bladder outlet  obstruction.   PROCEDURE:  Cystoscopy, suprapubic tube placement, transurethral resection  of the prostate.   SURGEON:  Sigmund I. Patsi Sears, M.D.   ASSISTANT:  Susanne Borders, MD   ANESTHESIA:  General endotracheal anesthesia.   DRAINS:  26 French three way hematuria catheter with continuous bladder  irrigation.   SPECIMENS:  Prostate chips for pathology.   COMPLICATIONS:  None.   ESTIMATED BLOOD LOSS:  100 mL.   DISPOSITION:  To post-anesthesia care unit in stable condition.   INDICATIONS FOR PROCEDURE:  The patient is a 77 year old gentleman with a  history of significant lower urinary tract symptoms.  The patient has noted  nocturia, decreased force of stream and dribbling.  The patient has tried  Flomax, but this has not been successful.  The patient underwent a  transrectal ultrasound which demonstrated an approximately 80 gram prostate.  Because of the size of his prostate and significant symptoms and his failure  to improve on medical therapy, the patient wished to have a surgical  procedure for treatment of his benign prostatic hyperplasia. After careful  counseling and understanding all of the risks and benefits, the patient  decided on transurethral resection of the prostate.  He also consented for  placement of a suprapubic tube in the event that he is not urinate  postoperatively.   DESCRIPTION OF PROCEDURE:  The  patient was brought to the operating room  where he was correctly identified by his identification bracelet.  He was  given general endotracheal anesthesia and preoperative antibiotics.  He was  placed in the dorsal lithotomy position and his genitalia were prepped and  draped in the typical sterile fashion.  Cystoscopy was performed first with  a 22 French cystoscope using a 12 degree lens.  The anterior urethra was  within normal limits.  The posterior urethra demonstrated a markedly  elongated and large prostate gland.  The patient had significant trilobar  hypertrophy with a median lobe that protruded into the bladder.  The bladder  was entered and demonstrated 2+ trabeculation.  Systematic survey of the  bladder mucosa demonstrated no mucosal abnormalities, tumors or foreign  bodies.  The bilateral ureteral orifices were seen in their normal anatomic  location well away from the site of planned resection.  The patient's  bladder was filled until it could easily be palpated just above the pubic  bone.  The cystocope was removed  and a Lowsley tractor was carefully placed  through the urethra and into the bladder.  The tractor was positioned such  that it was palpated just above the symphysis pubis.  A 15 blade knife was  then used to cut down on the tip of the Lowsley tractor.  The tractor was  then opened and a 16 [place] Foley catheter was grasped and pulled through  the urethra to the urethral meatus.  The tractor was disconnected and the  Foley catheter was backed into the bladder until a flow was obtained through  the catheter.  The balloon was then inflated to 10 mL.  Next, a 28 French  resectoscope sheath was placed using the obturator.  The resectoscope was  placed through the sheath and the resection was begun.  Initial resection  was carried out from approximately 5:00 to 7:00.  The bulk of the median  lobe was resected with care taken not to resect near the ureteral orifices.   The distal extent of the resection was to a level just a couple of mm away  from the verumontanum.  The cutting current was used primarily with  coagulation current used for any small bleeding vessels.  Next, the left  side of the prostate was resected from approximately 12:00 to 6:00.  The  resection was carried out down to the capsule.  There was no venous capsular  bleeding noted.  Resection was then performed in a similar fashion on the  right side of the prostate using cutting current and coagulation current as  necessary.  A very large amount of prostate was resected, as the patient has  a known very large prostate gland.  After both sides of the prostate were  satisfactorily resected, the chips were irrigated out of the bladder.  The  resectoscope was replaced and any bleeding was carefully coagulated.  Having  completed the resection and having stopped all bleeding the bladder was  inspected one final time for any more chips and none were found.  The  resectoscope was then removed and a 24 [place] three way hematuria catheter  was placed and irrigated to clear.  The Foley catheter was put on mild  traction and connected to continuous bladder irrigation with normal saline.  It should be noted that glycine was used throughout the procedure as the  irrigant.  The irrigation flowing from the three way hematuria catheter was  clear and the patient was allowed to awaken from his procedure and did so  without complications.  Please note that Dr. Patsi Sears was present and  participated in all aspects of this case as he is the primary surgeon.     Susanne Borders, MD                           Sigmund I. Patsi Sears, M.D.    DR/MEDQ  D:  07/07/2003  T:  07/07/2003  Job:  [id number]

## 2011-02-25 NOTE — Discharge Summary (Signed)
Gregory Padilla, Gregory Padilla            ACCOUNT NO.:  [id number]   MEDICAL RECORD NO.:  [id number]          PATIENT TYPE:  INP   LOCATION:  3018                         FACILITY:  [facility]   PHYSICIAN:  Pramod P. Pearlean Brownie, MD    DATE OF BIRTH:  1925/01/29   DATE OF ADMISSION:  10/03/2004  DATE OF DISCHARGE:  10/07/2004                                 DISCHARGE SUMMARY   ADMISSION DIAGNOSIS:  Stroke.   DISCHARGE DIAGNOSES:  1.  Small left parietal and occipital middle cerebral artery branch infarcts      due to intracranial arteriosclerosis.  2.  Diabetes.  3.  Hyperlipidemia.   HISTORY OF PRESENT ILLNESS:  Gregory Padilla is a 77 year old Caucasian male  who developed sudden onset of right-sided body weakness and numbness at 10  a.m. with breakfast.  His symptoms had started improving after  __________  bringing him to the hospital.  A code stroke had been called en route.  The  patient was not considered a candidate for ________ intervention due to  quick resolution of symptoms. When evaluated in the emergency room, he  complained of slight heaviness on his right side but no major weakness.  A  CT scan of the head showed no acute  __________.  A small left parietal  infarct was noted.  Admission labs unremarkable.   HOSPITAL COURSE:  He was admitted to the stroke service for risk  stratification work-up and subsequently underwent MRI scan of the brain on  October 04, 2004 which showed tiny acute infarcts in the left parietal and  occipital lobe adjacent to the old moderate-sized infarct with  encephalomalacia in that area.  There was also a possible extension of a  tiny acute infarction on the posterior left frontal lobe.  There were mild  changes of cervical spondylosis seen.  MRA of the intracranial circulation  showed moderate arteriosclerotic changes mainly in the small peripheral  branches.  MRA of the neck revealed a smooth long segment 50 to 60% stenosis  of the left internal  carotid artery. A 2-D echocardiogram showed normal  ejection fraction without obvious cardiac source of embolism.  Carotid  ultrasound showed only a 40 to 60% left internal carotid artery stenosis.  Transcranial Doppler studies unremarkable.  Lipid profile showed a mildly  elevated LDL of 109 and hemoglobin of A1C of 7.2.  Homocysteine was normal.  The patient was started on Aggrenox for secondary stroke prevention and  advised to follow up with his family physician, Gregory Padilla for treatment  for his borderline hyperlipidemia and diabetes.  He was also advised to take  a diabetic low fat diet.   DISCHARGE MEDICATIONS:  1.  Aggrenox 1 capsule daily for two weeks to increase to twice a day if      tolerated.  2.  Dicyclomine 10 mg three times daily.  3.  Xanax 0.25 mg three times daily as needed.  4.  Omeprazole 20 mg once a day.  5.  UdaminSP one capsule three times daily.  6.  Guaifenesin 600/__________ 120 mg twice daily.   FOLLOWUP:  The patient was instructed  to see Gregory Padilla in two weeks and  Dr. Pearlean Brownie in his office in two months.      PPS/MEDQ  D:  12/26/2004  T:  12/27/2004  Job:  [id number]   cc:   Gregory Padilla in [place], MD

## 2011-02-25 NOTE — H&P (Signed)
NAMEJEYSON, DESHOTEL            ACCOUNT NO.:  [id number]   MEDICAL RECORD NO.:  [id number]          PATIENT TYPE:  INP   LOCATION:  3018                         FACILITY:  [facility]   PHYSICIAN:  Pramod P. Pearlean Brownie, MD    DATE OF BIRTH:  1925/03/26   DATE OF ADMISSION:  10/03/2004  DATE OF DISCHARGE:                                HISTORY & PHYSICAL   HISTORY OF PRESENT ILLNESS:  Mr. Flessner is a 77 year old, Caucasian male  who developed sudden onset of right upper and lower extremity weakness while  having breakfast about 10 a.m. today.  He also noticed numbness on the right  side.  He had trouble gripping his cup of coffee and lifting his shoulders.  He also noticed trouble walking.  His wife called 911 and by the time EMS  got there, his symptoms had starting improving and had resolved completely  by the time he reached the emergency room.  He stated his weakness lasted  only 5 minutes.  A code stroke was called by the EMS en route, however,  since the patient improved substantially, he was not considered a candidate  for aggressive intervention or thrombolysis.  The patient denied any  symptoms in the form of headache, blurred vision or vertigo.  He does have a  prior history of similar episodes 2 years ago and he has right-sided  weakness and fell down.  This happened twice within a week, but he did not  seek any medical intervention.  Two to three months ago, he also stated that  he had problems with weakness.  He did fall down, but was able to get up  immediately.   PAST MEDICAL HISTORY:  1.  Borderline diabetes which is diet-controlled.  2.  Benign prostatic hypertrophy.  3.  Gastroesophageal reflux disease.  4.  Chronic Meniere's disease.   PAST SURGICAL HISTORY:  1.  Gallbladder surgery.  2.  Prostate surgery.   MEDICATIONS:  Xanax and Prevacid.   ALLERGIES:  No known drug allergies.   SOCIAL HISTORY:  The patient is retired.  He use to work for [employer].  He lives  with his wife.  His family physician is Dr. Vear Clock in [place].   REVIEW OF SYSTEMS:  Not significant for chest pain, palpitations, cough,  shortness of breath or diarrhea.   PHYSICAL EXAMINATION:  GENERAL:  Pleasant, Caucasian who is not in distress.  VITAL SIGNS:  Afebrile, pulse 78 per minute, regular sinus rhythm,  respirations 18, blood pressure in sitting position 137/84.  HEENT:  Normocephalic, atraumatic.  Decreased hearing bilaterally.  NECK:  Supple.  CARDIAC:  No murmur or gallop.  LUNGS:  Clear to auscultation.  ABDOMEN:  Soft, nontender.  Surgical scar from prior gallbladder surgery  seen on the abdomen.  CHEST:  Upper chest reveals multiple skin lesions consistent with melanomas.  NEUROLOGIC:  Oriented, alert and cooperative.  There is no dysarthria.  Follows three-step commands well.  He has intact recall.  Pupils are  irregular, but reactive.  Face is symmetric.  Tongue is midline.  He has  decreased hearing bilaterally with subjective tinnitus.  Motor exam reveals  no upper extremity drift, symmetric tone, coordination and sensation.  Finger-to-nose coordination accurate.  Sensation is normal.  Plantars are  both downgoing.  Reflexes symmetric.  Gait was not tested.   LABORATORY DATA AND X-RAY FINDINGS:  Noncontrast CAT scan of the head done  today reveals no acute abnormality, old left parietal hypodensities noted  consistent with old infarct.   CBC significantly only for a low hematocrit of 36.  Basic metabolic panel  and cardiac enzymes normal.  EKG shows normal sinus rhythm without acute  abnormalities.   IMPRESSION:  A 77 year old gentleman with left hemispheric transient  ischemic attacks with known previous history of transient ischemic attacks  with left parietal infarct who has not had a neurovascular evaluation to  date.   PLAN:  1.  Admit the patient for stroke risk stratification workup to stroke unit.  2.  Telemetry monitoring.  3.  Check MRI  scan of the brain with MRA of the brain and neck.  4.  Carotid Doppler studies.  5.  Fasting lipid profile with hemoglobin A1C and homocystine.  6.  Start aspirin for stroke prevention for now until workup is completed.  7.  I had a long discussion with the patient, his wife and son answering all      questions.       PPS/MEDQ  D:  10/03/2004  T:  10/04/2004  Job:  [id number]   cc:   Vear Clock, M.D.  [place], [place]

## 2011-03-30 ENCOUNTER — Other Ambulatory Visit: Payer: Self-pay | Admitting: Urology

## 2011-03-30 ENCOUNTER — Other Ambulatory Visit (HOSPITAL_COMMUNITY): Payer: Self-pay | Admitting: Urology

## 2011-03-30 ENCOUNTER — Ambulatory Visit (HOSPITAL_COMMUNITY)
Admission: RE | Admit: 2011-03-30 | Discharge: 2011-03-30 | Disposition: A | Discharge status: Home / Self Care | Payer: Medicare Other | Source: Ambulatory Visit | Attending: Urology | Admitting: Urology

## 2011-03-30 ENCOUNTER — Encounter (HOSPITAL_COMMUNITY): Payer: Medicare Other

## 2011-03-30 DIAGNOSIS — Z01812 Encounter for preprocedural laboratory examination: Secondary | ICD-10-CM | POA: Insufficient documentation

## 2011-03-30 DIAGNOSIS — Z01818 Encounter for other preprocedural examination: Secondary | ICD-10-CM

## 2011-03-30 DIAGNOSIS — N320 Bladder-neck obstruction: Secondary | ICD-10-CM | POA: Insufficient documentation

## 2011-03-30 DIAGNOSIS — E119 Type 2 diabetes mellitus without complications: Secondary | ICD-10-CM | POA: Insufficient documentation

## 2011-03-30 DIAGNOSIS — M47814 Spondylosis without myelopathy or radiculopathy, thoracic region: Secondary | ICD-10-CM | POA: Insufficient documentation

## 2011-03-30 LAB — CBC
HCT: 37.7 % — ABNORMAL LOW (ref 39.0–52.0)
Hemoglobin: 12.8 g/dL — ABNORMAL LOW (ref 13.0–17.0)
MCH: 31.5 pg (ref 26.0–34.0)
RBC: 4.06 MIL/uL — ABNORMAL LOW (ref 4.22–5.81)

## 2011-03-30 LAB — BASIC METABOLIC PANEL
BUN: 9 mg/dL (ref 6–23)
CO2: 26 mEq/L (ref 19–32)
Glucose, Bld: 96 mg/dL (ref 70–99)
Potassium: 4.4 mEq/L (ref 3.5–5.1)
Sodium: 137 mEq/L (ref 135–145)

## 2011-03-30 LAB — SURGICAL PCR SCREEN: Staphylococcus aureus: NEGATIVE

## 2011-04-04 ENCOUNTER — Other Ambulatory Visit: Payer: Self-pay | Admitting: Urology

## 2011-04-04 ENCOUNTER — Ambulatory Visit (HOSPITAL_COMMUNITY)
Admission: RE | Admit: 2011-04-04 | Discharge: 2011-04-05 | Disposition: A | Discharge status: Home / Self Care | Payer: Medicare Other | Source: Ambulatory Visit | Attending: Urology | Admitting: Urology

## 2011-04-04 ENCOUNTER — Ambulatory Visit: Payer: Medicare Other | Admitting: Physical Medicine & Rehabilitation

## 2011-04-04 DIAGNOSIS — K219 Gastro-esophageal reflux disease without esophagitis: Secondary | ICD-10-CM | POA: Insufficient documentation

## 2011-04-04 DIAGNOSIS — R351 Nocturia: Secondary | ICD-10-CM | POA: Insufficient documentation

## 2011-04-04 DIAGNOSIS — N401 Enlarged prostate with lower urinary tract symptoms: Secondary | ICD-10-CM | POA: Insufficient documentation

## 2011-04-04 DIAGNOSIS — N138 Other obstructive and reflux uropathy: Secondary | ICD-10-CM | POA: Insufficient documentation

## 2011-04-04 DIAGNOSIS — Z8673 Personal history of transient ischemic attack (TIA), and cerebral infarction without residual deficits: Secondary | ICD-10-CM | POA: Insufficient documentation

## 2011-04-04 DIAGNOSIS — E119 Type 2 diabetes mellitus without complications: Secondary | ICD-10-CM | POA: Insufficient documentation

## 2011-04-04 DIAGNOSIS — R3915 Urgency of urination: Secondary | ICD-10-CM | POA: Insufficient documentation

## 2011-04-04 DIAGNOSIS — Z79899 Other long term (current) drug therapy: Secondary | ICD-10-CM | POA: Insufficient documentation

## 2011-04-04 LAB — GLUCOSE, CAPILLARY
Glucose-Capillary: 126 mg/dL — ABNORMAL HIGH (ref 70–99)
Glucose-Capillary: 126 mg/dL — ABNORMAL HIGH (ref 70–99)

## 2011-04-05 ENCOUNTER — Inpatient Hospital Stay (HOSPITAL_COMMUNITY)
Admission: EM | Admit: 2011-04-05 | Discharge: 2011-04-08 | DRG: 985 | Disposition: A | Discharge status: Home / Self Care | Payer: Medicare Other | Attending: Family Medicine | Admitting: Family Medicine

## 2011-04-05 DIAGNOSIS — N401 Enlarged prostate with lower urinary tract symptoms: Secondary | ICD-10-CM | POA: Diagnosis present

## 2011-04-05 DIAGNOSIS — N390 Urinary tract infection, site not specified: Secondary | ICD-10-CM | POA: Diagnosis present

## 2011-04-05 DIAGNOSIS — I6529 Occlusion and stenosis of unspecified carotid artery: Secondary | ICD-10-CM | POA: Diagnosis present

## 2011-04-05 DIAGNOSIS — Z8673 Personal history of transient ischemic attack (TIA), and cerebral infarction without residual deficits: Secondary | ICD-10-CM

## 2011-04-05 DIAGNOSIS — W19XXXA Unspecified fall, initial encounter: Secondary | ICD-10-CM | POA: Diagnosis present

## 2011-04-05 DIAGNOSIS — Z7902 Long term (current) use of antithrombotics/antiplatelets: Secondary | ICD-10-CM

## 2011-04-05 DIAGNOSIS — R5383 Other fatigue: Secondary | ICD-10-CM | POA: Diagnosis present

## 2011-04-05 DIAGNOSIS — Y92009 Unspecified place in unspecified non-institutional (private) residence as the place of occurrence of the external cause: Secondary | ICD-10-CM

## 2011-04-05 DIAGNOSIS — R4789 Other speech disturbances: Secondary | ICD-10-CM | POA: Diagnosis present

## 2011-04-05 DIAGNOSIS — D649 Anemia, unspecified: Secondary | ICD-10-CM | POA: Diagnosis present

## 2011-04-05 DIAGNOSIS — N138 Other obstructive and reflux uropathy: Secondary | ICD-10-CM | POA: Diagnosis present

## 2011-04-05 DIAGNOSIS — Y998 Other external cause status: Secondary | ICD-10-CM

## 2011-04-05 DIAGNOSIS — E860 Dehydration: Secondary | ICD-10-CM | POA: Diagnosis present

## 2011-04-05 DIAGNOSIS — Z7982 Long term (current) use of aspirin: Secondary | ICD-10-CM

## 2011-04-05 DIAGNOSIS — E119 Type 2 diabetes mellitus without complications: Secondary | ICD-10-CM | POA: Diagnosis present

## 2011-04-05 DIAGNOSIS — I6350 Cerebral infarction due to unspecified occlusion or stenosis of unspecified cerebral artery: Principal | ICD-10-CM | POA: Diagnosis present

## 2011-04-05 DIAGNOSIS — R3915 Urgency of urination: Secondary | ICD-10-CM | POA: Diagnosis present

## 2011-04-05 DIAGNOSIS — R5381 Other malaise: Secondary | ICD-10-CM | POA: Diagnosis present

## 2011-04-05 LAB — GLUCOSE, CAPILLARY
Glucose-Capillary: 126 mg/dL — ABNORMAL HIGH (ref 70–99)
Glucose-Capillary: 125 mg/dL — ABNORMAL HIGH (ref 70–99)

## 2011-04-06 ENCOUNTER — Inpatient Hospital Stay (HOSPITAL_COMMUNITY): Payer: Medicare Other

## 2011-04-06 ENCOUNTER — Emergency Department (HOSPITAL_COMMUNITY): Payer: Medicare Other

## 2011-04-06 DIAGNOSIS — I059 Rheumatic mitral valve disease, unspecified: Secondary | ICD-10-CM

## 2011-04-06 LAB — CK TOTAL AND CKMB (NOT AT ARMC)
CK, MB: 3.6 ng/mL (ref 0.3–4.0)
Total CK: 157 U/L (ref 7–232)

## 2011-04-06 LAB — HEMOGLOBIN A1C: Mean Plasma Glucose: 148 mg/dL — ABNORMAL HIGH (ref <117)

## 2011-04-06 LAB — APTT: aPTT: 36 seconds (ref 24–37)

## 2011-04-06 LAB — DIFFERENTIAL
Eosinophils Absolute: 0.4 10*3/uL (ref 0.0–0.7)
Eosinophils Relative: 4 % (ref 0–5)
Lymphs Abs: 0.5 10*3/uL — ABNORMAL LOW (ref 0.7–4.0)
Monocytes Absolute: 1.3 10*3/uL — ABNORMAL HIGH (ref 0.1–1.0)
Monocytes Relative: 13 % — ABNORMAL HIGH (ref 3–12)

## 2011-04-06 LAB — CBC
HCT: 33.9 % — ABNORMAL LOW (ref 39.0–52.0)
HCT: 34.4 % — ABNORMAL LOW (ref 39.0–52.0)
Hemoglobin: 11.7 g/dL — ABNORMAL LOW (ref 13.0–17.0)
Hemoglobin: 11.9 g/dL — ABNORMAL LOW (ref 13.0–17.0)
RBC: 3.69 MIL/uL — ABNORMAL LOW (ref 4.22–5.81)
WBC: 8.3 10*3/uL (ref 4.0–10.5)
WBC: 9.7 10*3/uL (ref 4.0–10.5)

## 2011-04-06 LAB — PROTIME-INR
INR: 1.21 (ref 0.00–1.49)
Prothrombin Time: 15.6 seconds — ABNORMAL HIGH (ref 11.6–15.2)

## 2011-04-06 LAB — BASIC METABOLIC PANEL
BUN: 10 mg/dL (ref 6–23)
CO2: 23 mEq/L (ref 19–32)
GFR calc non Af Amer: 56 mL/min — ABNORMAL LOW (ref >60)
Glucose, Bld: 133 mg/dL — ABNORMAL HIGH (ref 70–99)
Potassium: 3.8 mEq/L (ref 3.5–5.1)
Sodium: 131 mEq/L — ABNORMAL LOW (ref 135–145)

## 2011-04-06 LAB — COMPREHENSIVE METABOLIC PANEL
Albumin: 3.5 g/dL (ref 3.5–5.2)
Alkaline Phosphatase: 55 U/L (ref 39–117)
BUN: 10 mg/dL (ref 6–23)
Chloride: 101 mEq/L (ref 96–112)
Creatinine, Ser: 1.24 mg/dL (ref 0.50–1.35)
GFR calc Af Amer: >60 mL/min (ref >60)
Glucose, Bld: 119 mg/dL — ABNORMAL HIGH (ref 70–99)
Potassium: 3.9 mEq/L (ref 3.5–5.1)
Total Bilirubin: 0.3 mg/dL (ref 0.3–1.2)
Total Protein: 5.9 g/dL — ABNORMAL LOW (ref 6.0–8.3)

## 2011-04-06 LAB — GLUCOSE, CAPILLARY
Glucose-Capillary: 93 mg/dL (ref 70–99)
Glucose-Capillary: 131 mg/dL — ABNORMAL HIGH (ref 70–99)
Glucose-Capillary: 142 mg/dL — ABNORMAL HIGH (ref 70–99)

## 2011-04-06 LAB — URINE MICROSCOPIC-ADD ON

## 2011-04-06 LAB — LIPID PANEL
Cholesterol: 128 mg/dL (ref 0–200)
Triglycerides: 124 mg/dL (ref <150)
VLDL: 25 mg/dL (ref 0–40)

## 2011-04-06 LAB — URINALYSIS, ROUTINE W REFLEX MICROSCOPIC
Bilirubin Urine: NEGATIVE
Glucose, UA: NEGATIVE mg/dL
Glucose, UA: NEGATIVE mg/dL
Ketones, ur: 15 mg/dL — AB
Protein, ur: 30 mg/dL — AB
Specific Gravity, Urine: 1.002 — ABNORMAL LOW (ref 1.005–1.030)
Urobilinogen, UA: 0.2 mg/dL (ref 0.0–1.0)
pH: 7.5 (ref 5.0–8.0)

## 2011-04-06 LAB — CARDIAC PANEL(CRET KIN+CKTOT+MB+TROPI)
CK, MB: 3.7 ng/mL (ref 0.3–4.0)
Troponin I: <0.3 ng/mL (ref <0.30)

## 2011-04-07 LAB — URINE CULTURE
Colony Count: NO GROWTH
Culture  Setup Time: 201206271245
Culture: NO GROWTH

## 2011-04-08 NOTE — H&P (Signed)
Gregory Padilla, RECUPERO NO.:  [id number]  MEDICAL RECORD NO.:  [id number]  LOCATION:  2109                         FACILITY:  [facility]  PHYSICIAN:  Eduard Clos, MDDATE OF BIRTH:  02-Jul-1925  DATE OF ADMISSION:  04/05/2011 DATE OF DISCHARGE:                             HISTORY & PHYSICAL   PRIMARY CARE PHYSICIAN:  At [facility].  PRIMARY UROLOGIST:  Sigmund I. Patsi Sears, MD  CHIEF COMPLAINT:  Weakness.  HISTORY OF PRESENT ILLNESS:  An 77 year old male with known history of Meniere disease, diabetes mellitus on diet control, hypertension, hyperlipidemia, previous history of CVA which left him with mild left- sided weakness, and recent TURP for BPH that was done just 4 days ago. The patient was discharged home yesterday.  At the time of discharge, the patient was feeling weak and had nausea and vomiting and eventually the patient felt better and was discharged home and when he went home he felt profoundly weak and he was not able to do his regular activity. His son was trying to wake him up from the bed and he was found so weak that the son was not able to help him also and eventually the patient came to the ER.  The patient said that over the last few days also he noted some slurred speech.  The patient at this time does not have any nausea, vomiting, or abdominal pain.  Denies any dysuria, discharge, or diarrhea.  The patient is able to urinate without any difficulty.  The patient has no headache or visual symptoms.  Is able to swallow without any difficulty.  The patient has passed swallow.  The patient at this time is admitted for gait instability and slurred speech to rule out CVA.  PAST MEDICAL HISTORY: 1. History of CVA, which left the patient with mild left-sided     weakness. 2. History of diabetes mellitus type 2, on diet. 3. History of hypertension. 4. History of hyperlipidemia. 5. History of Meniere disease. 6. History of TURP in 2004  and 2 days ago. 7. History of carotid stenosis. 8. History of skin cancer. 9. Degenerative joint disease.  MEDICATIONS PRIOR TO ADMISSION: 1. The patient is on Xanax 0.25 mg p.o. daily at bedtime as needed. 2. Voltaren topical gel for arthritis. 3. Vitamin C. 4. Vitamin B. 5. Tylenol Extra Strength. 6. Systane ointment to both eyes. 7. Nexium 40 mg p.o. daily. 8. Multivitamin therapeutic 1 tablet p.o. daily. 9. MiraLax. 10.Oxybutynin 10% topical. 11.Flomax 0.4 mg daily. 12.Fiber p.o. p.r.n. 13.Dicyclomine 10 mg daily as needed. 14.Crestor 5 mg p.o. daily. 15.Plavix 75 mg p.o. daily. 16.Calcium carbonate. 17.Benadryl 25 mg p.o. daily. 18.Aspirin 81 mg p.o. daily.  ALLERGIES:  AMITRIPTYLINE.  SOCIAL HISTORY:  The patient does not smoke cigarette, drink alcohol, or use illegal drugs.  Married.  Lives with his wife.  FAMILY HISTORY:  Mother died at age 45 from pancreatic cancer.  Father died at age 105 from cancer in the back.  There is also history of coronary artery disease in the family.  REVIEW OF SYSTEMS:  As per history of present illness, nothing else significant.  PHYSICAL EXAMINATION:  VITAL SIGNS:  The patient examined at bedside not in  acute distress.  The patient is hard of hearing. VITAL SIGNS:  Blood pressure 134/75, pulse 70 per minute, temperature 98.2, respirations 18, O2 sat 100%. HEENT:  Anicteric.  No pallor.  No discharge from ears, eyes, nose, or mouth.  I do not see obvious facial asymmetry, though the patient's family said that they did notice some left facial droop.  PERRLA positive.  The patient is able to roll both eyes.  Tongue is midline. NECK:  No neck rigidity. CHEST:  Bilaterally air entry present.  No rhonchi, no crepitation. HEART:  S1 and S2 heard. ABDOMEN:  Soft, nontender.  Bowel sounds heard. CNS:  Alert, awake, oriented to time, place, and person.  He is able to move upper and lower extremities.  At this time, I see almost 5/4  in both upper extremity and 5/5 in the lower extremities though he states he has mild weakness in the left upper extremity from previous CVA.  The patient does not have any pronator drift.  I did not make the patient walk at this time as he is still week. EXTREMITIES:  Peripheral pulses felt.  No acute ischemic changes.  No cyanosis or clubbing.  LABORATORY DATA:  EKG shows normal sinus rhythm with heart rate around 74 beats per minute with nonspecific ST-T changes.  Chest x-ray shows no evidence of active pulmonary disease.  CT of the head without contrast shows diffuse atrophic and small-vessel ischemic changes, old left posterior parietal infarct, no evidence of acute intracranial hemorrhage, mass lesion, or acute infarct.  CBC; WBC is 9.7, hemoglobin is 11.9, hematocrit is 34.4, platelets 170, neutrophils 77%.  Basic metabolic panel; sodium 131, potassium 3.8, chloride 97, carbon dioxide 22, glucose 133, BUN 10, creatinine 1.2, calcium 9, CK 157, CK-MB 3.6, relative index 2.3, troponin-I less than 0.3.  UA clear, 15 ketones, glucose negative, large blood, nitrites negative, leukocytes small, WBC 3-6, RBC 21-50, bacteria rare.  ASSESSMENT: 1. Gait instability with slurred speech with cerebrovascular accident. 2. Dehydration. 3. Possible urinary tract infection. 4. Recent transurethral resection of the prostate 2 days ago. 5. Anemia. 6. History of hypertension. 7. History of previous cerebrovascular accident with mild left-sided     weakness. 8. Diabetes mellitus type 2 on diet. 9. History of Meniere  disease.  PLAN: 1. At this time, admit the patient to telemetry. 2. For his gait instability and slurred speech at this time we will     admit the patient to telemetry, place the patient on neuro check.     Obtain MRI, MRA of the brain, 2-D echo, and carotid Doppler.  The     patient is on Plavix and aspirin which we will continue.  The     patient already passed swallow  evaluation.  Based on the MRI     findings, we will have further recommendation. 3. Possible UTI with recent TURP.  At this time, obtain urine cultures     and sensitivity.  At this time, I am going to keep the patient on     ceftriaxone and obtain urine culture.  We will further plan. 4. Dehydration.  At this time, we will gently hydrate the patient and     check his labs again in the a.m.  We will also check his LFT with     the next lab check along with cardiac enzymes. 5. Further recommendation based on test order and clinical course.     Eduard Clos, MD     ANK/MEDQ  D:  04/06/2011  T:  04/06/2011  Job:  [id number]  cc:   Vonzell Schlatter. Patsi Sears, M.D.  Electronically Signed by Midge Minium MD on 04/08/2011 12:27:08 AM

## 2011-04-11 ENCOUNTER — Encounter (INDEPENDENT_AMBULATORY_CARE_PROVIDER_SITE_OTHER): Payer: Medicare Other | Admitting: Vascular Surgery

## 2011-04-11 DIAGNOSIS — I6529 Occlusion and stenosis of unspecified carotid artery: Secondary | ICD-10-CM

## 2011-04-12 NOTE — Consult Note (Signed)
NEW PATIENT CONSULTATION  Gregory Padilla, Gregory Padilla DOB:  09-Sep-1925                                       04/11/2011 NGEXB#:[id number]  The patient is an 77 year old male patient referred for carotid occlusive disease and a recent stroke.  This patient 1 week ago had a TURP performed by Dr. Patsi Sears at [facility] and was discharged that day.  When he got home he fell, did not arise.  It is unclear where the weakness was actually located although it seems it was more in the left leg than on the right side.  He does have a history of a right brain stroke 6 years ago which caused some left-sided weakness. He denies any episodes of amaurosis fugax, aphasia and or episodes of right hemiparesis.  He did have a hospitalization and the MRI scan revealed an acute small left brain infarct.  CT scan did not reveal any evidence of carotid stenosis, although this was done without contrast. He did have an ultrasound study performed at [facility] which revealed a 60%-7 9% left internal carotid stenosis.  The velocities by report today were only 160/40 in the left ICA with some mild to moderate heterogeneous plaque but no ulceration noted.  CHRONIC MEDICAL PROBLEMS: 1. Diabetes type 2. 2. Hypertension. 3. Hyperlipidemia. 4. History of right brain stroke. 5. Negative for coronary artery disease, COPD.  SOCIAL HISTORY:  He is married, has 2 children and is retired.  He does not use tobacco or alcohol.  FAMILY HISTORY:  Positive for diabetes in his mother, coronary artery disease in his father.  Negative for stroke.  REVIEW OF SYSTEMS:  Positive for balance problems, changes in hearing, arthritis, history of right brain stroke.  Denies chest pain, dyspnea on exertion, PND, orthopnea.  Does have urinary frequency.  All other systems are negative in a complete review of systems.  PHYSICAL EXAMINATION:  Vital signs:  Blood pressure 142/80, heart rate 67,  respirations 18.  General:  He is an elderly male in no apparent distress.  Alert and oriented x3.  Does not hear well.  HEENT:  Exam normal for age.  EOMs intact.  Lungs:  Clear to auscultation.  No rhonchi or wheezing.  Cardiovascular:  Regular rhythm.  No murmurs. Carotid pulses 3+.  No audible bruits.  Abdomen:  Soft, nontender with no masses.  Neurologic:  Normal.  Skin:  Free of rashes.  Lower extremities:  Exam reveals 3+ femoral, 2+ posterior tibial pulses bilaterally.  Today I reviewed all of the hospital records including the discharge summary, history and physical, MRI and CT scans and also got a verbal report of the carotid duplex performed 3 days ago.  IMPRESSION:  Recent small left brain cerebrovascular accident with no residual deficit.  The patient does have mild to moderate left internal carotid artery stenosis which may or may not have been responsible for his recent mild stroke.  He is on aspirin and Plavix which I would recommend continuing.  I will see him back in 3 months for followup carotid duplex exam.  I think the severity of the stenosis is in the mild range, in the 50%-60% range more than likely and I am not convinced that that caused his symptoms.  We will reassess this in 3 months and the possibility of left carotid endarterectomy still exists depending on severity of disease.  Quita Skye Hart Rochester, M.D. Electronically Signed  JDL/MEDQ  D:  04/11/2011  T:  04/12/2011  Job:  [id number]  cc:   Loma Sender, MD Dr Esaw Dace

## 2011-04-20 NOTE — Discharge Summary (Signed)
NAMEMarland Kitchen  CHRISTIANO, BLANDON NO.:  [id number]  MEDICAL RECORD NO.:  [id number]  LOCATION:  3013                         FACILITY:  [facility]  PHYSICIAN:  Brendia Sacks, MD    DATE OF BIRTH:  [birth year]  DATE OF ADMISSION:  04/05/2011 DATE OF DISCHARGE:  04/08/2011                              DISCHARGE SUMMARY   PRIMARY CARE PHYSICIAN:  Loma Sender, MD, in [place].  PRIMARY UROLOGIST:  Sigmund I. Patsi Sears, MD  CONDITION ON DISCHARGE:  Improved.  DISPOSITION:  Home with outpatient physical therapy.  DISCHARGE DIAGNOSES: 1. Left brain infarct. 2. Symptomatic carotid stenosis. 3. Generalized weakness and gait instability. 4. Diabetes mellitus type 2, controlled with diet. 5. Recent prostatectomy.  HISTORY OF PRESENT ILLNESS:  This is an 77 year old man who presented to the emergency room with generalized weakness.  He recently had a prostate surgery performed on June 25.  Afterwards he developed gaitinstability and possibly slurred speech and came into the emergency room for further evaluation.  HOSPITAL COURSE: 1. Left brain infarct.  The patient was admitted to telemetry and a     stroke evaluation was performed which was notable for a small left     brain infarct.  He was evaluated by Speech Therapy.  He had no     linguistic or cognitive deficits to be addressed.  He did complain     of intermittent difficulty swallowing which is very rare, however     the patient did not want to pursue any further evaluation and there     were no overt signs or symptoms of aspiration and therefore Speech     Therapy did not recommend that it was necessary to proceed with any     further evaluation.  At this point the patient continues to have no     difficulty eating and so no further evaluation will be performed at     this time.  He has been assessed by physical therapy and outpatient     physical therapy has been recommended.  He has no outpatient  occupational therapy needs. 2. Symptomatic carotid stenosis.  Preliminary Doppler report is     notable for a left-sided carotid stenosis 60-79%, lower end of the     range.  At this point the patient will continue on aspirin and     Plavix with no change of continue on Crestor.  His blood pressures     extremely well controlled.  We discussed the case briefly with Dr.     Hoy Morn, Neurology, recommended outpatient nonemergent evaluation     with Vascular Surgery.  The patient will follow up with Dr. Hart Rochester     in the outpatient setting for recommendations in regard to his     carotid stenosis. 3. Diabetes mellitus type 2.  This has remained diet controlled. 4. Recent prostatectomy.  Dr. Patsi Sears was notified of the patient's     admission and one of his assistance has been invited to see the     patient.  IMAGING: 1. Chest x-ray June 27:  No evidence of acute pulmonary disease. 2. CT of the head June 27:  Diffuse atrophic and small vessel ischemic  changes.  Old left posterior parietal infarct.  No evidence of     acute intracranial hemorrhage, mass, lesion or acute infarct. 3. MRI of the brain:  Solitary punctate small vessel infarct in the     left cingulate gyrus.  No mass effect or hemorrhage.  No other     acute intracranial abnormality.  Multilevel degenerative cervical     spinal stenosis at least moderate at C3-4. 4. MRA of the brain on June 27:  Stable intracranial MRA.  No proximal     stenosis or major branch occlusion.  ANCILLARY STUDIES:  A 2-D echocardiogram on June 27:  Left ventricular ejection fraction 55-60%.  Grade 1 diastolic dysfunction.  Preliminary Doppler report of bilateral carotids:  Right side shows no evidence of ICA stenosis, vertebral artery flow was antegrade.  Left side showed 60-79% ICA stenosis of the lower end of the range. Vertebral artery flow antegrade.  PERTINENT LABORATORY STUDIES: 1. Hemoglobin A1c was 6.8. 2. Total cholesterol was  128 and LDL was 50. 3. CBC was essentially unremarkable. 4. Basic metabolic panel was essentially unremarkable. 5. Capillary blood sugars were well controlled. 6. Cardiac markers x2 were negative. 7. Urine culture was negative.  Exam on discharge, the patient feels well.  No complaints.  He has had no difficulty eating.  Temperature is 98.1, pulse 63, respirations 19, blood pressure 135/63, sat 99% on room air.  Cardiovascular, regular rate and rhythm.  No murmur, rub, or gallop.  Respiratory, clear to auscultation bilaterally.  No wheezes, rales or rhonchi.  Normal respiratory effort.  The patient is hard of hearing but neurologic exam does appear to be unchanged.  DISCHARGE INSTRUCTIONS:  The patient will be discharged home today. Diet is a heart-healthy diabetic diet.  Activity, use a rolling walker. He will follow up with outpatient physical therapy.  Follow up with Dr. Loma Sender, primary care physician, in approximately 1 month and will follow up with Dr. Hart Rochester, Vascular Surgery, on July 2 at 9:00 a.m. for symptomatic carotid stenosis.  DISCHARGE MEDICATIONS: 1. Aspirin 81 mg p.o. daily. 2. Benadryl 25 mg every 6 hours as needed for allergies. 3. Calcium carbonate and vitamin D p.o. b.i.d. 4. Crestor 5 mg p.o. nightly. 5. Dicyclomine 10 mg p.o. daily as needed for irritable bowel     syndrome. 6. Fiber 1 teaspoon p.o. b.i.d. 7. Flomax 0.4 mg 2 capsules p.o. daily. 8. Oxybutynin 10% topical application daily. 9. MiraLax 17 g p.o. daily as needed for constipation. 10.Multivitamin p.o. daily. 11.Nexium 40 mg p.o. daily. 12.Plavix 75 mg nightly. 13.Systane eyedrops 1 drop both eyes q.a.m. 14.Tylenol Extra Strength 500 mg 2 tablets every 6 hours as needed for     pain. 15.Vitamin B complex 1 tablet p.o. daily. 16.Vitamin C 1000 mg 2 tablets p.o. daily. 17.Voltaren 1 application daily as needed for pain. 18.Xanax 0.25 mg p.o. nightly as needed for anxiety. 19.Systane  ointment 1 application both eyes at bedtime.  Time coordinating the patient's discharge 25 minutes.     Brendia Sacks, MD     DG/MEDQ  D:  04/08/2011  T:  04/09/2011  Job:  [id number]  cc:   Loma Sender, MD Quita Skye. Hart Rochester, M.D. Sigmund I. Patsi Sears, M.D.  Electronically Signed by Brendia Sacks  on 04/20/2011 06:39:03 PM

## 2011-04-21 NOTE — Op Note (Signed)
  Gregory Padilla, Gregory Padilla            ACCOUNT NO.:  [id number]  MEDICAL RECORD NO.:  [id number]  LOCATION:  1420                         FACILITY:  [facility]  PHYSICIAN:  Johniya Durfee I. Patsi Sears, M.D.DATE OF BIRTH:  [birth year]  DATE OF PROCEDURE:  04/04/2011 DATE OF DISCHARGE:                              OPERATIVE REPORT   PREOPERATIVE DIAGNOSIS:  Benign prostatic hyperplasia.  POSTOPERATIVE DIAGNOSIS:  Benign prostatic hyperplasia.  OPERATION:  Holmium laser prostatectomy.  SURGEON:  Kalim Kissel I. Patsi Sears, M.D.  ANESTHESIA:  Spinal.  PREPARATION:  After appropriate preanesthesia, the patient was brought to the operating room, placed on the operating room in the dorsal supine position.  He was then replaced in the left lateral decubitus position where spinal anesthetic was introduced.  He was then replaced in dorsal lithotomy position where the pubis was prepped with Betadine solution, draped in usual fashion.  REVIEW OF THE HISTORY:  He is an 77 year old male with severe urinary urgency, nocturia q.1h. and occasional urge incontinence.  He is status post CVA in April 2011 with additional CVA in 2005.  He had 2004 TURP. IPSS is 15.  PSA 1.21.  Note that, he is status post TURP in 2004.  The urodynamics shows the capacity of 512 mL with normal bladder sensation. Pressure flow study shows a maximum flow of 12 mL per second with detrusor pressure at maximum flow of 60 cm of water with the PVR of 118 mL.  Cystoscopy shows grossly enlarged right lateral lobe.  He is now for holmium laser prostatectomy.  DESCRIPTION OF PROCEDURE:  Cystourethroscopy is accomplished and shows that the patient has a resected left lobe of the prostate, but a very large right lobe of the prostate, which was completely obstructing his bladder outlet.  Further cystoscopy shows clear reflux in the normal trigone and the ureteral orifices were normal.  There is no evidence of bladder stone, tumor, bladder  diverticular formation.  Holmium laser was used then to create channels at the 7 o'clock position and at the 11 o'clock position and very large chunks of right lateral lobe were taken. Because of the extended length of the procedure and because of the patient's spinal anesthetic began to wear out, I converted the procedure to a TURP, completed the resection of the right lateral lobe with TURP. The patient the tolerated well.  Chips were evacuated free from the bladder and a 22 Foley catheter with 30 mL balloon was placed.  There was bleeding noted at the end of procedure and I elected to place traction.  I will admit the patient for 23-hour observation.     Acquanetta Cabanilla I. Patsi Sears, M.D.     SIT/MEDQ  D:  04/04/2011  T:  04/04/2011  Job:  [id number]  Electronically Signed by Jethro Bolus M.D. on 04/21/2011 08:04:16 AM

## 2011-06-02 ENCOUNTER — Encounter: Payer: Self-pay | Admitting: Vascular Surgery

## 2011-07-11 ENCOUNTER — Encounter: Payer: Self-pay | Admitting: Vascular Surgery

## 2011-07-11 LAB — DIFFERENTIAL
Basophils Absolute: 0
Eosinophils Absolute: 0.3
Lymphocytes Relative: 17
Lymphs Abs: 1.7
Neutrophils Relative %: 61

## 2011-07-11 LAB — BASIC METABOLIC PANEL
BUN: 10
Creatinine, Ser: 1.19
GFR calc non Af Amer: 59 — ABNORMAL LOW

## 2011-07-11 LAB — CBC
MCV: 97.3
Platelets: 203
WBC: 10.2

## 2011-07-12 ENCOUNTER — Other Ambulatory Visit (INDEPENDENT_AMBULATORY_CARE_PROVIDER_SITE_OTHER): Payer: Medicare Other | Admitting: *Deleted

## 2011-07-12 ENCOUNTER — Ambulatory Visit (INDEPENDENT_AMBULATORY_CARE_PROVIDER_SITE_OTHER): Payer: Medicare Other | Admitting: Vascular Surgery

## 2011-07-12 ENCOUNTER — Encounter: Payer: Self-pay | Admitting: Vascular Surgery

## 2011-07-12 VITALS — BP 116/61 | HR 73 | Resp 20 | Ht 71.0 in | Wt 171.0 lb

## 2011-07-12 DIAGNOSIS — I63239 Cerebral infarction due to unspecified occlusion or stenosis of unspecified carotid arteries: Secondary | ICD-10-CM

## 2011-07-12 DIAGNOSIS — I6529 Occlusion and stenosis of unspecified carotid artery: Secondary | ICD-10-CM

## 2011-07-12 NOTE — Progress Notes (Signed)
Subjective:     Patient ID: Gregory Padilla, male   DOB: 11-24-1924, 77 y.o.   MRN: [id number]  HPI this 77 year old male patient returns today for followup regarding his carotid occlusive disease. He suffered a small left brain stroke about 3 months ago following a TURP by Dr. Patsi Sears he also had a remote history of a right brain stroke 6 years ago with some mild left-sided weakness. He had evidence of mild to moderate left internal carotid stenosis. He returns today with no new neurologic symptoms. He continues to have some problems with balance probably related to previous stroke. He did have some nausea and vomiting a few days ago and saw Dr. Vear Clock who feels that it may be a partial small bowel obstruction. Patient seems better today   Past Medical History  Diagnosis Date  . Diabetes mellitus age 75    no insulin  . Stroke   . Arthritis   . Carotid artery occlusion   . CVA (cerebral vascular accident)     left sided weakness  . Hyperlipidemia   . Hypertension   . Cancer     history of skin cancer  . Meniere disease   . Degenerative joint disease   . BPH (benign prostatic hyperplasia)     History  Substance Use Topics  . Smoking status: Never Smoker   . Smokeless tobacco: Never Used  . Alcohol Use: No    Family History  Problem Relation Age of Onset  . Diabetes Mother   . Arthritis Mother   . Coronary artery disease Father   . Heart disease Father   . Stroke Neg Hx     Allergies  Allergen Reactions  . Amitriptyline     Current outpatient prescriptions:Acetaminophen (TYLENOL PO), Take by mouth.  , Disp: , Rfl: ;  ALPRAZolam (XANAX) 0.25 MG tablet, Take 0.25 mg by mouth at bedtime as needed.  , Disp: , Rfl: ;  Ascorbic Acid (VITAMIN C PO), Take by mouth.  , Disp: , Rfl: ;  aspirin 81 MG tablet, Take 81 mg by mouth daily.  , Disp: , Rfl: ;  B Complex Vitamins (VITAMIN B COMPLEX PO), Take by mouth.  , Disp: , Rfl: ;  CALCIUM PO, Take by mouth.  , Disp: , Rfl:    clopidogrel (PLAVIX) 75 MG tablet, Take 75 mg by mouth daily.  , Disp: , Rfl: ;  diclofenac sodium (VOLTAREN) 1 % GEL, Apply topically.  , Disp: , Rfl: ;  dicyclomine (BENTYL) 10 MG capsule, Take 10 mg by mouth daily.  , Disp: , Rfl: ;  esomeprazole (NEXIUM) 40 MG capsule, Take 40 mg by mouth daily before breakfast.  , Disp: , Rfl: ;  oxybutynin (DITROPAN-XL) 10 MG 24 hr tablet, Take 10 mg by mouth daily.  , Disp: , Rfl:  Polyethyl Glycol-Propyl Glycol (SYSTANE OP), Apply to eye.  , Disp: , Rfl: ;  Polyethylene Glycol 3350 (MIRALAX PO), Take by mouth as needed.  , Disp: , Rfl: ;  rosuvastatin (CRESTOR) 5 MG tablet, Take 5 mg by mouth daily.  , Disp: , Rfl: ;  diphenhydrAMINE (SOMINEX) 25 MG tablet, Take 25 mg by mouth at bedtime as needed.  , Disp: , Rfl: ;  Tamsulosin HCl (FLOMAX PO), Take 0.4 mg by mouth daily.  , Disp: , Rfl:   BP 116/61  Pulse 73  Resp 20  Ht 5\' 11"  (1.803 m)  Wt 171 lb (77.565 kg)  BMI 23.85 kg/m2  Body mass  index is 23.85 kg/(m^2).        Review of Systems complains of arthritis, dyspnea at rest on occasion, urinary frequency, recent nausea and vomiting, chronic constipation, all other systems are negative and complete review of systems    Objective:   Physical Exam blood pressure 116/61 heart rate 73 respirations 20 General he is an elderly male who is no apparent stress alert and oriented x3 HEENT normal for age Lungs no rhonchi or wheezing Cardiovascular regular no murmurs carotid pulses 3+ no audible bruits Neurologic exam generalized weakness not specifically lateralizing, unsteady gait walks with cane Abdomen soft nontender no masses or tenderness Lower extremity exam reveals 3+ femoral pulses bilaterally musculoskeletal no obvious deformities  Today I ordered a carotid duplex exam which I reviewed and interpreted. He has no flow reduction in the right internal carotid and very minimal flow reduction in the left internal carotid this does not appear as  severe as the previous study done at hospital    Assessment:     Minimal left carotid occlusive disease with recent left brain stroke and remote right brain stroke    Plan:    will repeat carotid duplex exam in 12 months unless new symptoms occur

## 2011-07-12 NOTE — Progress Notes (Signed)
Addended by: Merri Ray A on: 07/12/2011 03:46 PM   Modules accepted: Orders

## 2011-07-18 NOTE — Procedures (Unsigned)
CAROTID DUPLEX EXAM  INDICATION:  Carotid disease  HISTORY: Diabetes:  No Cardiac:  No Hypertension:  Yes Smoking:  No Previous Surgery:  No CV History:  Complaint of unsteadiness, history of CVA Amaurosis Fugax No, Paresthesias No, Hemiparesis No                                      RIGHT             LEFT Brachial systolic pressure:         124               118 Brachial Doppler waveforms:         Normal            Normal Vertebral direction of flow:        Antegrade/not visualized DUPLEX VELOCITIES (cm/sec) CCA peak systolic                   61                68 ECA peak systolic                   82                96 ICA peak systolic                   62                116 ICA end diastolic                   18                25 PLAQUE MORPHOLOGY:                  Mixed             Mixed PLAQUE AMOUNT:                      Mild              Mild PLAQUE LOCATION:                    ICA/ECA           ICA   IMPRESSION:  Doppler velocities suggest no hemodynamically significant stenosis of the bilateral internal carotid arteries, with plaque formations as described above.       ___________________________________________ Quita Skye. Hart Rochester, M.D.  CH/MEDQ  D:  07/14/2011  T:  07/14/2011  Job:  [id number]

## 2011-09-15 ENCOUNTER — Encounter (HOSPITAL_COMMUNITY): Payer: Self-pay | Admitting: Emergency Medicine

## 2011-09-15 ENCOUNTER — Emergency Department (HOSPITAL_COMMUNITY): Payer: Medicare Other

## 2011-09-15 ENCOUNTER — Observation Stay (HOSPITAL_COMMUNITY)
Admission: EM | Admit: 2011-09-15 | Discharge: 2011-09-17 | Disposition: A | Discharge status: Home / Self Care | Payer: Medicare Other | Attending: Internal Medicine | Admitting: Internal Medicine

## 2011-09-15 ENCOUNTER — Other Ambulatory Visit: Payer: Self-pay

## 2011-09-15 DIAGNOSIS — D72829 Elevated white blood cell count, unspecified: Secondary | ICD-10-CM | POA: Diagnosis present

## 2011-09-15 DIAGNOSIS — N400 Enlarged prostate without lower urinary tract symptoms: Secondary | ICD-10-CM | POA: Insufficient documentation

## 2011-09-15 DIAGNOSIS — G459 Transient cerebral ischemic attack, unspecified: Principal | ICD-10-CM | POA: Diagnosis present

## 2011-09-15 DIAGNOSIS — R0602 Shortness of breath: Secondary | ICD-10-CM | POA: Insufficient documentation

## 2011-09-15 DIAGNOSIS — R29898 Other symptoms and signs involving the musculoskeletal system: Secondary | ICD-10-CM | POA: Insufficient documentation

## 2011-09-15 DIAGNOSIS — I69998 Other sequelae following unspecified cerebrovascular disease: Secondary | ICD-10-CM | POA: Insufficient documentation

## 2011-09-15 DIAGNOSIS — E119 Type 2 diabetes mellitus without complications: Secondary | ICD-10-CM | POA: Diagnosis present

## 2011-09-15 DIAGNOSIS — K5900 Constipation, unspecified: Secondary | ICD-10-CM | POA: Insufficient documentation

## 2011-09-15 DIAGNOSIS — E871 Hypo-osmolality and hyponatremia: Secondary | ICD-10-CM | POA: Diagnosis present

## 2011-09-15 LAB — CBC
Hemoglobin: 11.7 g/dL — ABNORMAL LOW (ref 13.0–17.0)
MCH: 32 pg (ref 26.0–34.0)
MCH: 31.1 pg (ref 26.0–34.0)
Platelets: 172 10*3/uL (ref 150–400)
RBC: 4.19 MIL/uL — ABNORMAL LOW (ref 4.22–5.81)
RBC: 3.76 MIL/uL — ABNORMAL LOW (ref 4.22–5.81)
RDW: 12.3 % (ref 11.5–15.5)

## 2011-09-15 LAB — COMPREHENSIVE METABOLIC PANEL
ALT: 25 U/L (ref 0–53)
AST: 41 U/L — ABNORMAL HIGH (ref 0–37)
Albumin: 4.4 g/dL (ref 3.5–5.2)
Alkaline Phosphatase: 79 U/L (ref 39–117)
Calcium: 9.5 mg/dL (ref 8.4–10.5)
Potassium: 4.4 mEq/L (ref 3.5–5.1)
Sodium: 133 mEq/L — ABNORMAL LOW (ref 135–145)
Total Protein: 7.8 g/dL (ref 6.0–8.3)

## 2011-09-15 LAB — PROTIME-INR
INR: 0.97 (ref 0.00–1.49)
Prothrombin Time: 13.1 seconds (ref 11.6–15.2)

## 2011-09-15 LAB — CREATININE, SERUM
Creatinine, Ser: 0.95 mg/dL (ref 0.50–1.35)
GFR calc Af Amer: 86 mL/min — ABNORMAL LOW (ref >90)

## 2011-09-15 LAB — DIFFERENTIAL
Basophils Absolute: 0 10*3/uL (ref 0.0–0.1)
Basophils Relative: 0 % (ref 0–1)
Eosinophils Absolute: 0.2 10*3/uL (ref 0.0–0.7)
Lymphs Abs: 1 10*3/uL (ref 0.7–4.0)
Neutrophils Relative %: 69 % (ref 43–77)

## 2011-09-15 LAB — CK TOTAL AND CKMB (NOT AT ARMC): Relative Index: 1.5 (ref 0.0–2.5)

## 2011-09-15 LAB — TROPONIN I: Troponin I: <0.3 ng/mL (ref <0.30)

## 2011-09-15 MED ORDER — ENOXAPARIN SODIUM 40 MG/0.4ML ~~LOC~~ SOLN
40.0000 mg | Freq: Every day | SUBCUTANEOUS | Status: DC
  Administered 2011-09-15 – 2011-09-16 (×2): 40 mg via SUBCUTANEOUS
  Filled 2011-09-15 (×3): qty 0.4

## 2011-09-15 MED ORDER — CALCIUM CARBONATE-VITAMIN D 500-200 MG-UNIT PO TABS
1.0000 | ORAL_TABLET | Freq: Two times a day (BID) | ORAL | Status: DC
  Administered 2011-09-15 – 2011-09-17 (×4): 1 via ORAL
  Filled 2011-09-15 (×5): qty 1

## 2011-09-15 MED ORDER — VITAMIN C 500 MG PO TABS
1000.0000 mg | ORAL_TABLET | Freq: Every day | ORAL | Status: DC
  Administered 2011-09-16 – 2011-09-17 (×2): 1000 mg via ORAL
  Filled 2011-09-15 (×2): qty 2

## 2011-09-15 MED ORDER — ASPIRIN 81 MG PO TABS: 81.0000 mg | ORAL_TABLET | Freq: Every day | ORAL | Status: DC

## 2011-09-15 MED ORDER — ROSUVASTATIN CALCIUM 5 MG PO TABS
5.0000 mg | ORAL_TABLET | Freq: Every day | ORAL | Status: DC
  Administered 2011-09-16: 5 mg via ORAL
  Filled 2011-09-15 (×3): qty 1

## 2011-09-15 MED ORDER — DICLOFENAC SODIUM 1 % TD GEL
1.0000 "application " | Freq: Four times a day (QID) | TRANSDERMAL | Status: DC
  Administered 2011-09-15 – 2011-09-17 (×3): 1 via TOPICAL
  Filled 2011-09-15: qty 100

## 2011-09-15 MED ORDER — ACETAMINOPHEN 500 MG PO TABS
500.0000 mg | ORAL_TABLET | Freq: Four times a day (QID) | ORAL | Status: DC | PRN
  Administered 2011-09-15 – 2011-09-17 (×2): 500 mg via ORAL
  Filled 2011-09-15 (×2): qty 1

## 2011-09-15 MED ORDER — ONDANSETRON HCL 4 MG/2ML IJ SOLN
INTRAMUSCULAR | Status: AC
  Administered 2011-09-15: 4 mg via INTRAVENOUS
  Filled 2011-09-15: qty 2

## 2011-09-15 MED ORDER — SODIUM CHLORIDE 0.9 % IJ SOLN: 3.0000 mL | Freq: Two times a day (BID) | INTRAMUSCULAR | Status: DC

## 2011-09-15 MED ORDER — SODIUM CHLORIDE 0.9 % IJ SOLN: 3.0000 mL | INTRAMUSCULAR | Status: DC | PRN

## 2011-09-15 MED ORDER — ALPRAZOLAM 0.25 MG PO TABS: 0.2500 mg | ORAL_TABLET | Freq: Every evening | ORAL | Status: DC | PRN

## 2011-09-15 MED ORDER — DIPHENHYDRAMINE HCL (SLEEP) 25 MG PO TABS: 25.0000 mg | ORAL_TABLET | Freq: Every evening | ORAL | Status: DC | PRN

## 2011-09-15 MED ORDER — SODIUM CHLORIDE 0.9 % IV SOLN: 250.0000 mL | INTRAVENOUS | Status: DC | PRN

## 2011-09-15 MED ORDER — CLOPIDOGREL BISULFATE 75 MG PO TABS
75.0000 mg | ORAL_TABLET | Freq: Every day | ORAL | Status: DC
  Administered 2011-09-16 – 2011-09-17 (×2): 75 mg via ORAL
  Filled 2011-09-15 (×2): qty 1

## 2011-09-15 MED ORDER — CALCIUM CITRATE-VITAMIN D 200-200 MG-UNIT PO TABS: 1.0000 | ORAL_TABLET | Freq: Two times a day (BID) | ORAL | Status: DC

## 2011-09-15 MED ORDER — DIPHENHYDRAMINE HCL 25 MG PO CAPS: 25.0000 mg | ORAL_CAPSULE | Freq: Every evening | ORAL | Status: DC | PRN

## 2011-09-15 MED ORDER — SODIUM CHLORIDE 0.9 % IV SOLN
Freq: Once | INTRAVENOUS | Status: AC
  Administered 2011-09-15: 19:00:00 via INTRAVENOUS

## 2011-09-15 MED ORDER — DICYCLOMINE HCL 10 MG PO CAPS
10.0000 mg | ORAL_CAPSULE | Freq: Three times a day (TID) | ORAL | Status: DC
  Administered 2011-09-16 – 2011-09-17 (×6): 10 mg via ORAL
  Filled 2011-09-15 (×10): qty 1

## 2011-09-15 MED ORDER — PANTOPRAZOLE SODIUM 40 MG PO TBEC
40.0000 mg | DELAYED_RELEASE_TABLET | Freq: Every day | ORAL | Status: DC
  Administered 2011-09-16 – 2011-09-17 (×2): 40 mg via ORAL
  Filled 2011-09-15 (×2): qty 1

## 2011-09-15 MED ORDER — ASPIRIN EC 81 MG PO TBEC
81.0000 mg | DELAYED_RELEASE_TABLET | Freq: Every day | ORAL | Status: DC
  Administered 2011-09-16 – 2011-09-17 (×2): 81 mg via ORAL
  Filled 2011-09-15 (×2): qty 1

## 2011-09-15 MED ORDER — ONDANSETRON HCL 4 MG/2ML IJ SOLN
4.0000 mg | Freq: Once | INTRAMUSCULAR | Status: AC
  Administered 2011-09-15: 4 mg via INTRAVENOUS

## 2011-09-15 NOTE — ED Notes (Signed)
Called to give report to 3700. Nurse unavailable. Awaiting return call.

## 2011-09-15 NOTE — Progress Notes (Signed)
Pt temp 100.1 upon arrival to floor. 500mg  tylenol given at 2300. Pt temp rechecked and noted to be 100.4. Dr Betti Cruz notified. New orders obtained. Will continue to monitor.

## 2011-09-15 NOTE — ED Notes (Signed)
Pharmacy notified that code stroke is cancelled

## 2011-09-15 NOTE — ED Notes (Signed)
Pt complaining of nausea. New med order obtained. Medicated as listed. Awaiting admission,.

## 2011-09-15 NOTE — ED Notes (Addendum)
Pt was seen this morning at his PCP's office for chest congestion.  He left there went to [facility] for RX.  While at [facility], wife states that he began having slurred speech and ataxia.  Son had to assist pt into the house d/t ataxia.  Pt laid down for a nap for approximately one hour. Upon waking symptoms persisted..slurred speech and ataxia.  His wife reports that he has had a stroke in the past.  Family took pt back to PCP (Dr. Vear Clock) office where his doctor came to car to see the pt who was at that time vomiting. Dr. Vear Clock advised that he was having a stroke and needed to go to the ER ASAP.  Upon arrival to stretcher triage the patient exhibited no s/s of stroke.  Neuro status intact. Speech is clear, can shrug shoulders, grips and pedal pushes are strong and equal.  Face is symmetrical with no drooping noted.  No pronator drift witnessed. Pt denies nausea.

## 2011-09-15 NOTE — ED Notes (Signed)
ekg has been given to dr. Estell Harpin.  Both old and new.

## 2011-09-15 NOTE — Consult Note (Signed)
Reason for Consult: "slurred speech and left-sided weakness"  HPI: Gregory Padilla is an 77 y.o. Male who was brought in as stroke code due to sudden onset slurred speech and left sided weakness. NIHSS was 0 and this is the reason why t-PA was not given. Patient has not been feeling well since yesterday evening and vomited on arrival to ED. Family states that he has had similar episodes in the past with no clear reason why. Patient returned back to baseline when I examined him.   Past Medical History  Diagnosis Date  . Diabetes mellitus age 63    no insulin  . Stroke   . Arthritis   . Carotid artery occlusion   . CVA (cerebral vascular accident)     left sided weakness  . Hyperlipidemia   . Hypertension   . Meniere disease   . Degenerative joint disease   . BPH (benign prostatic hyperplasia)   . Cancer     history of skin cancer   Past Surgical History  Procedure Date  . Cholecystectomy   . Prostate surgery     x2  . Back surgery 2004  . Total knee arthroplasty    Family History  Problem Relation Age of Onset  . Diabetes Mother   . Arthritis Mother   . Coronary artery disease Father   . Heart disease Father   . Stroke Neg Hx    Social History:  reports that he has never smoked. He has never used smokeless tobacco. He reports that he does not drink alcohol or use illicit drugs.  Allergies:  Allergies  Allergen Reactions  . Amitriptyline     unknown   Medications: I have reviewed the patient's current medications.  ROS: as above  Neurological exam: AAO*3. No aphasia. Followed complex commands. Cranial nerves: EOMI, PERRL. Visual fields were full. Sensation to V1 through V3 areas of the face was intact and symmetric throughout. There was no facial asymmetry. Hearing to finger rub was equal and symmetrical bilaterally. Shoulder shrug was 5/5 and symmetric bilaterally. Head rotation was 5/5 bilaterally. There was no dysarthria or palatal deviation. Motor: strength was  5/5 and symmetric throughout. Sensory: was intact throughout to light touch, pinprick, vibration and proprioception. Coordination: finger-to-nose and heel-to-shin were intact and symmetric bilaterally. Reflexes: were 1+ in upper extremities and 1+ at the knees and 1+ at the ankles. Plantar response was mute bilaterally. Gait: deferred  Results for orders placed during the hospital encounter of 09/15/11 (from the past 48 hour(s))  PROTIME-INR     Status: Normal   Collection Time   09/15/11  5:50 PM      Component Value Range Comment   Prothrombin Time 13.1  11.6 - 15.2 (seconds)    INR 0.97  0.00 - 1.49    APTT     Status: Normal   Collection Time   09/15/11  5:50 PM      Component Value Range Comment   aPTT 32  24 - 37 (seconds)   CBC     Status: Abnormal   Collection Time   09/15/11  5:50 PM      Component Value Range Comment   WBC 10.6 (*) 4.0 - 10.5 (K/uL)    RBC 4.19 (*) 4.22 - 5.81 (MIL/uL)    Hemoglobin 13.4  13.0 - 17.0 (g/dL)    HCT 95.6  21.3 - 08.6 (%)    MCV 93.3  78.0 - 100.0 (fL)    MCH 32.0  26.0 -  34.0 (pg)    MCHC 34.3  30.0 - 36.0 (g/dL)    RDW 02.7  25.3 - 66.4 (%)    Platelets 172  150 - 400 (K/uL)   DIFFERENTIAL     Status: Abnormal   Collection Time   09/15/11  5:50 PM      Component Value Range Comment   Neutrophils Relative 69  43 - 77 (%)    Neutro Abs 7.3  1.7 - 7.7 (K/uL)    Lymphocytes Relative 9 (*) 12 - 46 (%)    Lymphs Abs 1.0  0.7 - 4.0 (K/uL)    Monocytes Relative 19 (*) 3 - 12 (%)    Monocytes Absolute 2.0 (*) 0.1 - 1.0 (K/uL)    Eosinophils Relative 2  0 - 5 (%)    Eosinophils Absolute 0.2  0.0 - 0.7 (K/uL)    Basophils Relative 0  0 - 1 (%)    Basophils Absolute 0.0  0.0 - 0.1 (K/uL)   COMPREHENSIVE METABOLIC PANEL     Status: Abnormal (Preliminary result)   Collection Time   09/15/11  5:50 PM      Component Value Range Comment   Sodium 133 (*) 135 - 145 (mEq/L)    Potassium 4.4  3.5 - 5.1 (mEq/L)    Chloride 98  96 - 112 (mEq/L)    CO2 22   19 - 32 (mEq/L)    Glucose, Bld 161 (*) 70 - 99 (mg/dL)    BUN 5 (*) 6 - 23 (mg/dL)    Creatinine, Ser 4.03  0.50 - 1.35 (mg/dL)    Calcium 9.5  8.4 - 10.5 (mg/dL)    Total Protein 7.8  6.0 - 8.3 (g/dL)    Albumin 4.4  3.5 - 5.2 (g/dL)    AST PENDING  0 - 37 (U/L)    ALT 25  0 - 53 (U/L)    Alkaline Phosphatase 79  39 - 117 (U/L)    Total Bilirubin 0.3  0.3 - 1.2 (mg/dL)    GFR calc non Af Amer 75 (*) >90 (mL/min)    GFR calc Af Amer 87 (*) >90 (mL/min)   CK TOTAL AND CKMB     Status: Abnormal   Collection Time   09/15/11  5:51 PM      Component Value Range Comment   Total CK 433 (*) 7 - 232 (U/L)    CK, MB 6.4 (*) 0.3 - 4.0 (ng/mL)    Relative Index 1.5  0.0 - 2.5    TROPONIN I     Status: Normal   Collection Time   09/15/11  5:51 PM      Component Value Range Comment   Troponin I <0.30  <0.30 (ng/mL)    Ct Head Wo Contrast  09/15/2011  *RADIOLOGY REPORT*  Clinical Data: Dizziness with nausea and vomiting.  History of stroke.  Code stroke.  CT HEAD WITHOUT CONTRAST  Technique:  Contiguous axial images were obtained from the base of the skull through the vertex without contrast.  Comparison: Head CT 04/06/2011.  MR of the brain 04/06/2011.  Findings: There is stable chronic encephalomalacia in the posterior left parietal lobe.  Scattered small vessel ischemic changes in the periventricular white matter are stable.  There is no evidence of acute intracranial hemorrhage, mass lesion, brain edema or extra- axial fluid collection.  There is no CT evidence of acute stroke.  The visualized paranasal sinuses are clear.  The calvarium is intact.  Intracranial vascular calcifications  are noted.  IMPRESSION:  1.  No CT evidence of acute stroke or hemorrhage. 2.  Stable chronic small vessel ischemic changes and left parietal encephalomalacia.  Critical Value/emergent results were called by telephone at the time of interpretation on 09/15/2011  at 1750 hours  to  Dr. Estell Harpin, who verbally acknowledged  these results.  Original Report Authenticated By: Gerrianne Scale, M.D.   Blood pressure 133/71, pulse 78, temperature 99.7 F (37.6 C), temperature source Oral, resp. rate 20, SpO2 98.00%.  Assessment/Plan: 77 years old man with transient left-sided weakness and dysarthria - possibly TIA vs. Presyncope 1) MRI brain/MRA head/neck 2) Echo, Telemetry 3) Orthostatics 4) Will follow  Arlenne Kimbley 09/15/2011, 6:51 PM

## 2011-09-15 NOTE — ED Notes (Signed)
Pt here complaining of cva symptoms sent over from primary medical doctor, pt was vomiting and MD stated that he thought pt was having a stroke and wanted to be evaluated at the emergency department, pt has passed the stroke screen and was sent to ct. MD at bedside and code stroke cancelled per Dr Lyman Speller.

## 2011-09-15 NOTE — ED Provider Notes (Signed)
History     CSN: [id number] Arrival date & time: 09/15/2011  5:30 PM   First MD Initiated Contact with Patient 09/15/11 1752      Chief Complaint  Patient presents with  . Code Stroke    slurred speech, ataxia  . Nausea    with emesis    (Consider location/radiation/quality/duration/timing/severity/associated sxs/prior treatment) Patient is a 77 y.o. male presenting with Acute Neurological Problem. The history is provided by a relative (The patient was at the drug store and started to have some slurred speech and weakness he was taken to his physician's office. The patient was sent to the emergency department for evaluation of stroke).  Cerebrovascular Accident This is a new problem. The current episode started 1 to 2 hours ago. The problem occurs rarely. The problem has been resolved. Pertinent negatives include no chest pain and no abdominal pain. The symptoms are aggravated by nothing. The symptoms are relieved by nothing. He has tried nothing for the symptoms. The treatment provided moderate relief.    Past Medical History  Diagnosis Date  . Diabetes mellitus age 34    no insulin  . Stroke   . Arthritis   . Carotid artery occlusion   . CVA (cerebral vascular accident)     left sided weakness  . Hyperlipidemia   . Hypertension   . Meniere disease   . Degenerative joint disease   . BPH (benign prostatic hyperplasia)   . Cancer     history of skin cancer    Past Surgical History  Procedure Date  . Cholecystectomy   . Prostate surgery     x2  . Back surgery 2004  . Total knee arthroplasty     Family History  Problem Relation Age of Onset  . Diabetes Mother   . Arthritis Mother   . Coronary artery disease Father   . Heart disease Father   . Stroke Neg Hx     History  Substance Use Topics  . Smoking status: Never Smoker   . Smokeless tobacco: Never Used  . Alcohol Use: No      Review of Systems  Constitutional: Negative for fatigue.  HENT: Negative  for congestion, sinus pressure and ear discharge.   Eyes: Negative for discharge.  Respiratory: Negative for cough.   Cardiovascular: Negative for chest pain.  Gastrointestinal: Negative for abdominal pain and diarrhea.  Genitourinary: Negative for frequency and hematuria.  Musculoskeletal: Negative for back pain.  Skin: Negative for rash.  Neurological: Positive for seizures.       Slurred speech and difficulty walking  Hematological: Negative.   Psychiatric/Behavioral: Negative for hallucinations.    Allergies  Amitriptyline  Home Medications   Current Outpatient Rx  Name Route Sig Dispense Refill  . ACETAMINOPHEN 500 MG PO TABS Oral Take 500 mg by mouth every 6 (six) hours as needed. For fever      . ALPRAZOLAM 0.25 MG PO TABS Oral Take 0.25 mg by mouth at bedtime as needed. For anxiety    . VITAMIN C PO Oral Take 1,000 mg by mouth daily.     . ASPIRIN 81 MG PO TABS Oral Take 81 mg by mouth daily.      Marland Kitchen CALCIUM CITRATE-VITAMIN D 200-200 MG-UNIT PO TABS Oral Take 1 tablet by mouth 2 (two) times daily.      Marland Kitchen CLOPIDOGREL BISULFATE 75 MG PO TABS Oral Take 75 mg by mouth daily.      Marland Kitchen DICLOFENAC SODIUM 1 % TD GEL Topical  Apply 1 application topically 4 (four) times daily.     Marland Kitchen DICYCLOMINE HCL 10 MG PO CAPS Oral Take 10 mg by mouth 4 (four) times daily -  before meals and at bedtime. For colon    . DIPHENHYDRAMINE HCL (SLEEP) 25 MG PO TABS Oral Take 25 mg by mouth at bedtime as needed. For allergy    . ESOMEPRAZOLE MAGNESIUM 40 MG PO CPDR Oral Take 40 mg by mouth daily before breakfast.      . SYSTANE OP Ophthalmic Apply to eye.      Frazier Butt OP Ophthalmic Apply 1 drop to eye daily. 1 drop each eye daily     . ROSUVASTATIN CALCIUM 5 MG PO TABS Oral Take 5 mg by mouth daily.        BP 130/61  Pulse 76  Temp(Src) 99.8 F (37.7 C) (Oral)  Resp 18  SpO2 99%  Physical Exam  Constitutional: He is oriented to person, place, and time. He appears well-developed.  HENT:  Head:  Normocephalic and atraumatic.  Eyes: Conjunctivae and EOM are normal. No scleral icterus.  Neck: Neck supple. No thyromegaly present.  Cardiovascular: Normal rate and regular rhythm.  Exam reveals no gallop and no friction rub.   No murmur heard. Pulmonary/Chest: No stridor. He has no wheezes. He has no rales. He exhibits no tenderness.  Abdominal: He exhibits no distension. There is no tenderness. There is no rebound.  Musculoskeletal: Normal range of motion. He exhibits no edema.  Lymphadenopathy:    He has no cervical adenopathy.  Neurological: He is oriented to person, place, and time. Coordination normal.  Skin: No rash noted. No erythema.  Psychiatric: He has a normal mood and affect. His behavior is normal.    ED Course  Procedures (including critical care time)  Labs Reviewed  CBC - Abnormal; Notable for the following:    WBC 10.6 (*)    RBC 4.19 (*)    All other components within normal limits  DIFFERENTIAL - Abnormal; Notable for the following:    Lymphocytes Relative 9 (*)    Monocytes Relative 19 (*)    Monocytes Absolute 2.0 (*)    All other components within normal limits  COMPREHENSIVE METABOLIC PANEL - Abnormal; Notable for the following:    Sodium 133 (*)    Glucose, Bld 161 (*)    BUN 5 (*)    AST 41 (*) SLIGHT HEMOLYSIS   GFR calc non Af Amer 75 (*)    GFR calc Af Amer 87 (*)    All other components within normal limits  CK TOTAL AND CKMB - Abnormal; Notable for the following:    Total CK 433 (*)    CK, MB 6.4 (*)    All other components within normal limits  PROTIME-INR  APTT  TROPONIN I  CBC  I-STAT TROPONIN I   Dg Chest 2 View  09/15/2011  *RADIOLOGY REPORT*  Clinical Data: Pain, shortness of breath.  CHEST - 2 VIEW  Comparison: 04/06/2011  Findings: Heart is upper limits normal in size.  Lungs are clear. No effusions.  No acute bony abnormality.  IMPRESSION: No active disease.  Original Report Authenticated By: Cyndie Chime, M.D.   Ct Head Wo  Contrast  09/15/2011  *RADIOLOGY REPORT*  Clinical Data: Dizziness with nausea and vomiting.  History of stroke.  Code stroke.  CT HEAD WITHOUT CONTRAST  Technique:  Contiguous axial images were obtained from the base of the skull through the vertex without  contrast.  Comparison: Head CT 04/06/2011.  MR of the brain 04/06/2011.  Findings: There is stable chronic encephalomalacia in the posterior left parietal lobe.  Scattered small vessel ischemic changes in the periventricular white matter are stable.  There is no evidence of acute intracranial hemorrhage, mass lesion, brain edema or extra- axial fluid collection.  There is no CT evidence of acute stroke.  The visualized paranasal sinuses are clear.  The calvarium is intact.  Intracranial vascular calcifications are noted.  IMPRESSION:  1.  No CT evidence of acute stroke or hemorrhage. 2.  Stable chronic small vessel ischemic changes and left parietal encephalomalacia.  Critical Value/emergent results were called by telephone at the time of interpretation on 09/15/2011  at 1750 hours  to  Dr. Estell Harpin, who verbally acknowledged these results.  Original Report Authenticated By: Gerrianne Scale, M.D.     1. TIA (transient ischemic attack)     The patient was seen by neurology and it was felt that he may have had a TIA. He will be admitted by medicine  MDM     . Date: 09/15/2011  Rate:72  Rhythm: normal sinus rhythm  QRS Axis: normal  Intervals: normal  ST/T Wave abnormalities: normal  Conduction Disutrbances:none  Narrative Interpretation:   Old EKG Reviewed: none available      Benny Lennert, MD 09/15/11 2050

## 2011-09-15 NOTE — ED Notes (Signed)
Urine sample has been collected if needed. 

## 2011-09-16 ENCOUNTER — Observation Stay (HOSPITAL_COMMUNITY): Payer: Medicare Other

## 2011-09-16 DIAGNOSIS — G459 Transient cerebral ischemic attack, unspecified: Secondary | ICD-10-CM

## 2011-09-16 DIAGNOSIS — I517 Cardiomegaly: Secondary | ICD-10-CM

## 2011-09-16 LAB — LIPID PANEL
Cholesterol: 137 mg/dL (ref 0–200)
VLDL: 21 mg/dL (ref 0–40)

## 2011-09-16 LAB — URINALYSIS, ROUTINE W REFLEX MICROSCOPIC
Glucose, UA: NEGATIVE mg/dL
Leukocytes, UA: NEGATIVE
Nitrite: NEGATIVE
Protein, ur: NEGATIVE mg/dL
Urobilinogen, UA: 0.2 mg/dL (ref 0.0–1.0)

## 2011-09-16 LAB — GLUCOSE, CAPILLARY
Glucose-Capillary: 128 mg/dL — ABNORMAL HIGH (ref 70–99)
Glucose-Capillary: 144 mg/dL — ABNORMAL HIGH (ref 70–99)
Glucose-Capillary: 145 mg/dL — ABNORMAL HIGH (ref 70–99)

## 2011-09-16 LAB — HEMOGLOBIN A1C: Mean Plasma Glucose: 146 mg/dL — ABNORMAL HIGH (ref <117)

## 2011-09-16 MED ORDER — SODIUM CHLORIDE 0.9 % IV SOLN
INTRAVENOUS | Status: AC
  Administered 2011-09-16: 01:00:00 via INTRAVENOUS

## 2011-09-16 NOTE — Progress Notes (Signed)
Stroke Team Progress Note  SUBJECTIVE  Gregory Padilla is a 77 y.o. male who was brought in as stroke code due to sudden onset slurred speech and left sided weakness. NIHSS was 0 and this is the reason why t-PA was not given. Patient has not been feeling well since the day prior to admission in the evening. He vomited on arrival to ED. Family states that he has had similar episodes in the past with no clear reason why. Patient returned back to baseline in the ED. He did not lose consciousness.  No family at the bedside. Overall he feels his condition is gradually improving, almost back to baseline per patient.patient this morning informs me that he was having gradual difficulty walking for the last few weeks and had fallen a day before. He had also noticed gradual change in his speech. He denies any focal neurological deficits yesterday.  OBJECTIVE Most recent Vital Signs: Temp: 98.3 F (36.8 C) (12/07 0800) Temp src: Oral (12/07 0800) BP: 127/59 mmHg (12/07 0800) Pulse Rate: 68  (12/07 0800) Respiratory Rate: 20 O2 Saturdation: 98%  IV Fluid Intake:     . sodium chloride 75 mL/hr at 09/16/11 0037   Diet:  Carb Control thin liquids  Activity:  Up with assistance  DVT Prophylaxis:  Lovenox 40 mg sq daily   Studies: CBC    Component Value Date/Time   WBC 9.6 09/15/2011 2206   RBC 3.76* 09/15/2011 2206   HGB 11.7* 09/15/2011 2206   HCT 35.0* 09/15/2011 2206   PLT 189 09/15/2011 2206   MCV 93.1 09/15/2011 2206   MCH 31.1 09/15/2011 2206   MCHC 33.4 09/15/2011 2206   RDW 12.1 09/15/2011 2206   LYMPHSABS 1.0 09/15/2011 1750   MONOABS 2.0* 09/15/2011 1750   EOSABS 0.2 09/15/2011 1750   BASOSABS 0.0 09/15/2011 1750   CMP    Component Value Date/Time   NA 133* 09/15/2011 1750   K 4.4 09/15/2011 1750   CL 98 09/15/2011 1750   CO2 22 09/15/2011 1750   GLUCOSE 161* 09/15/2011 1750   BUN 5* 09/15/2011 1750   CREATININE 0.95 09/15/2011 2206   CALCIUM 9.5 09/15/2011 1750   PROT 7.8 09/15/2011 1750    ALBUMIN 4.4 09/15/2011 1750   AST 41* 09/15/2011 1750   ALT 25 09/15/2011 1750   ALKPHOS 79 09/15/2011 1750   BILITOT 0.3 09/15/2011 1750   GFRNONAA 74* 09/15/2011 2206   GFRAA 86* 09/15/2011 2206   COAGS Lab Results  Component Value Date   INR 0.97 09/15/2011   INR 1.21 04/06/2011   INR 1.03 01/09/2010   Lipid Panel    Component Value Date/Time   CHOL 137 09/16/2011 0640   TRIG 103 09/16/2011 0640   HDL 56 09/16/2011 0640   CHOLHDL 2.4 09/16/2011 0640   VLDL 21 09/16/2011 0640   LDLCALC 60 09/16/2011 0640   HgbA1C  Lab Results  Component Value Date   HGBA1C 6.7* 09/15/2011   Urine Drug Screen  No results found for this basename: labopia, cocainscrnur, labbenz, amphetmu, thcu, labbarb    Alcohol Level No results found for this basename: eth   CT of the brain  1.  No CT evidence of acute stroke or hemorrhage. 2.  Stable chronic small vessel ischemic changes and left parietal encephalomalacia.   MRI of the brain  ordered   MRA of the brain  ordered   2D Echocardiogram  Done. Results pending.  Carotid Doppler  Left = 60-79% internal carotid artery stenosis.  Right = No significant ICA stenosis.   CXR   No active disease.    EKG  Ordered in ED, no results available.  Physical Exam   Pleasant elderly Caucasian male extremely hard of hearing and difficult to communicate with. Head is nontraumatic. Neck is supple. Pulses regular. Distal pulses are felt. There is no pedal edema.  Neurological exam   Awake alert oriented to time place and person. Follows commands well. Intact attention registration. Diminished recall. No aphasia apraxia or dysarthria. eye moments are full range without nystagmus.face is symmetric without weakness. Tongue is midline. Motor system exam reveals no upper or lower extremity drift. Symmetric and equal strength in all 4 extremities. Sensation appears to be grossly preserved. Deep tendon flexors are symmetric. Plantars are downgoing. Gait was not  tested. ASSESSMENT Mr. Gregory Padilla is a 77 y.o. male with transient left-sided hemiparesis, dysarthria, syncope and vomiting, doubt stroke due to gradual onset of symptoms. He does have multiple stroke risk factors. On aspirin 81 mg orally every day and clopidogrel 75 mg orally every day for secondary stroke prevention.  Gait difficulties, unclear etiology. ? Neuropathy. ? Spinal cord involvement.  Stroke risk factors:  carotid stenosis, diabetes mellitus, family history, hyperlipidemia, hypertension and previous stroke  Hospital day # 1  TREATMENT/PLAN Continue aspirin 81 mg orally every day for secondary stroke prevention. Complete stroke workup. PT/OT consultsWill follow up.  Joaquin Music, ANP-BC, GNP-BC [facility] Pager: [phone] 09/16/2011 10:06 AM  Dr. Delia Heady, Stroke Center Medical Director, has personally reviewed chart, pertinent data, examined the patient and developed the plan of care.

## 2011-09-16 NOTE — Progress Notes (Signed)
   CARE MANAGEMENT NOTE 09/16/2011  Patient:  Gregory Padilla, Gregory Padilla   Account Number:  [id number]  Date Initiated:  09/16/2011  Documentation initiated by:  Junius Creamer  Subjective/Objective Assessment:   adm w tia symptoms, fever     Action/Plan:   lives w wife, pcp dr Leonette Most phillips, has rw   Anticipated DC Date:  09/18/2011   Anticipated DC Plan:  HOME/SELF CARE      DC Planning Services  CM consult      Choice offered to / List presented to:             Status of service:   Medicare Important Message given?   (If response is "NO", the following Medicare IM given date fields will be blank) Date Medicare IM given:   Date Additional Medicare IM given:    Discharge Disposition:    Per UR Regulation:  Reviewed for med. necessity/level of care/duration of stay  Comments:  12/7  utilization review done, debbie Koltyn Kelsay rn,bsn

## 2011-09-16 NOTE — Progress Notes (Signed)
Patient seen and examined, admitted by Dr. Mikeal Hawthorne today a.m. reviewed H&P and data, neurology consultation - Agree with above assessment and plan, complete stroke workup - PTOT evaluation, the patient's pastor at his bedside confirms his mental status at the baseline - Hopefully DC home tomorrow if stable and workup complete  RAI,RIPUDEEP 09/16/2011, 2:29 PM

## 2011-09-16 NOTE — Progress Notes (Signed)
Physical Therapy Evaluation Patient Details Name: Gregory Padilla MRN: [id number] DOB: 01-Mar-1925 Today's Date: 09/16/2011  Problem List:  Patient Active Problem List  Diagnoses  . PERSONAL HISTORY OF COLONIC POLYPS  . TIA (transient ischemic attack)  . Diabetes mellitus  . Leucocytosis  . Hyponatremia    Past Medical History:  Past Medical History  Diagnosis Date  . Diabetes mellitus age 77    no insulin  . Stroke   . Arthritis   . Carotid artery occlusion   . CVA (cerebral vascular accident)     left sided weakness  . Hyperlipidemia   . Hypertension   . Meniere disease   . Degenerative joint disease   . BPH (benign prostatic hyperplasia)   . Cancer     history of skin cancer   Past Surgical History:  Past Surgical History  Procedure Date  . Cholecystectomy   . Prostate surgery     x2  . Back surgery 2004  . Total knee arthroplasty     PT Assessment/Plan/Recommendation PT Assessment Clinical Impression Statement: Patient is an 77 yo male admitted with weakness on left side and difficulty with speech.  Patient has history of previous CVA's.  Patient with general weakness throughout impacting mobility/balance.  Encouraged patient to use his RW (rather than cane) at home until his strength returns to baseline.  Also recommend OP PT for continued therapy. (Patient requests [facility] OP Neuro program) PT Recommendation/Assessment: Patient will need skilled PT in the acute care venue PT Problem List: Decreased strength;Decreased balance;Decreased mobility;Decreased knowledge of use of DME PT Therapy Diagnosis : Abnormality of gait;Difficulty walking;Generalized weakness;Hemiplegia non-dominant side (Hemiplegia from past CVA's) PT Plan PT Frequency: Min 4X/week PT Treatment/Interventions: DME instruction;Gait training;Functional mobility training;Balance training;Patient/family education PT Recommendation Follow Up Recommendations: Outpatient PT Equipment Recommended:  None recommended by PT PT Goals  Acute Rehab PT Goals PT Goal Formulation: With patient/family Time For Goal Achievement: 7 days Pt will Roll Supine to Right Side: Independently PT Goal: Rolling Supine to Right Side - Progress: Not met Pt will go Supine/Side to Sit: Independently;with HOB 0 degrees PT Goal: Supine/Side to Sit - Progress: Not met Pt will go Sit to Supine/Side: Independently;with HOB 0 degrees PT Goal: Sit to Supine/Side - Progress: Not met Pt will go Sit to Stand: with modified independence PT Goal: Sit to Stand - Progress: Not met Pt will go Stand to Sit: with modified independence PT Goal: Stand to Sit - Progress: Not progressing Pt will Ambulate: >150 feet;with modified independence;with rolling walker PT Goal: Ambulate - Progress: Not met  PT Evaluation Precautions/Restrictions   None Prior Functioning  Home Living Lives With: Spouse Type of Home: House Home Layout: One level Home Access: Ramped entrance Bathroom Shower/Tub: Engineer, manufacturing systems: Handicapped height Bathroom Accessibility: Yes How Accessible: Accessible via walker Home Adaptive Equipment: Shower chair with back;Walker - rolling;Straight cane Prior Function Level of Independence: Requires assistive device for independence;Independent with basic ADLs;Independent with homemaking with ambulation;Independent with gait Driving:  (States he will not drive any more at discharge) Vocation: Retired Financial risk analyst Arousal/Alertness: Awake/alert Overall Cognitive Status: Appears within functional limits for tasks assessed Orientation Level: Oriented X4 Sensation/Coordination Sensation Light Touch: Impaired by gross assessment (Decreased sensation left LE distal to knee) Coordination Gross Motor Movements are Fluid and Coordinated: Yes Extremity Assessment RUE Assessment RUE Assessment: Within Functional Limits LUE Assessment LUE Assessment: Within Functional Limits RLE  Assessment RLE Assessment:  (General weakness throughout noted) LLE Assessment LLE Assessment: Within Functional  Limits Mobility (including Balance) Bed Mobility Bed Mobility: Yes Rolling Right: 6: Modified independent (Device/Increase time);With rail Right Sidelying to Sit: 6: Modified independent (Device/Increase time);With rails;HOB flat Sit to Supine - Right: 6: Modified independent (Device/Increase time);With rail;HOB flat Transfers Transfers: Yes Sit to Stand: 5: Supervision;With upper extremity assist;From bed Sit to Stand Details (indicate cue type and reason): Cues to move slowly through transitions.  Cues for safe hand placement Stand to Sit: 5: Supervision;With upper extremity assist;To bed Stand to Sit Details: Cues for safety Ambulation/Gait Ambulation/Gait: Yes Ambulation/Gait Assistance: 4: Min assist Ambulation/Gait Assistance Details (indicate cue type and reason): Required min assist without use of AD - uses cane at home.  Less assist required with use of RW. Ambulation Distance (Feet): 200 Feet Assistive device: Rolling walker;None (Ambulated 30' with no AD - balance decreased.) Gait Pattern: Step-through pattern;Decreased stride length;Trunk flexed Gait velocity: Slow gait speed Stairs: No  Balance Balance Assessed: Yes Dynamic Standing Balance Dynamic Standing - Balance Support: Right upper extremity supported Dynamic Standing - Level of Assistance: 4: Min assist Dynamic Standing - Balance Activities: Reaching for objects Dynamic Standing - Comments: Patient with loss of balance with minimal reach outside of base of support without UE support.   End of Session PT - End of Session Equipment Utilized During Treatment: Gait belt Activity Tolerance: Patient tolerated treatment well Patient left: in bed;with call bell in reach;with family/visitor present General Behavior During Session: [facility] for tasks performed Cognition: [facility] for tasks performed  Vena Austria [id number] 09/16/2011, 4:05 PM

## 2011-09-16 NOTE — H&P (Signed)
Gregory Padilla is an 77 y.o. male.   Chief Complaint: Weakness/stroke HPI: 77 YO man with multiple medical problems including prior strokes who was brought in with Code stroke due to sudden weakness and suspected stroke. Symptoms resolved on arrival in the ED. Has been feeling generally unwell for 2 days now. Has residual left sided weakness from previous stroke but able to function. Neurology was consulted in ER but due to symptoms resolution, Patient is admitted to the hospitalist service for further workup.  Past Medical History  Diagnosis Date  . Diabetes mellitus age 42    no insulin  . Stroke   . Arthritis   . Carotid artery occlusion   . CVA (cerebral vascular accident)     left sided weakness  . Hyperlipidemia   . Hypertension   . Meniere disease   . Degenerative joint disease   . BPH (benign prostatic hyperplasia)   . Cancer     history of skin cancer    Past Surgical History  Procedure Date  . Cholecystectomy   . Prostate surgery     x2  . Back surgery 2004  . Total knee arthroplasty     Family History  Problem Relation Age of Onset  . Diabetes Mother   . Arthritis Mother   . Coronary artery disease Father   . Heart disease Father   . Stroke Neg Hx    Social History:  reports that he has never smoked. He has never used smokeless tobacco. He reports that he does not drink alcohol or use illicit drugs.  Allergies:  Allergies  Allergen Reactions  . Amitriptyline     unknown    Medications Prior to Admission  Medication Dose Route Frequency Provider Last Rate Last Dose  . 0.9 %  sodium chloride infusion   Intravenous Once Benny Lennert, MD 100 mL/hr at 09/15/11 1923    . 0.9 %  sodium chloride infusion  250 mL Intravenous PRN Lawal Garba      . 0.9 %  sodium chloride infusion   Intravenous Continuous Srikar A Reddy 75 mL/hr at 09/16/11 0037    . acetaminophen (TYLENOL) tablet 500 mg  500 mg Oral Q6H PRN Lawal Garba   500 mg at 09/15/11 2254  .  ALPRAZolam (XANAX) tablet 0.25 mg  0.25 mg Oral QHS PRN Lawal Garba      . aspirin EC tablet 81 mg  81 mg Oral Daily Ripudeep K Rai, MD      . calcium-vitamin D (OSCAL WITH D) 500-200 MG-UNIT per tablet 1 tablet  1 tablet Oral BID Ripudeep Jenna Luo, MD   1 tablet at 09/15/11 2255  . clopidogrel (PLAVIX) tablet 75 mg  75 mg Oral QAC breakfast Lawal Garba      . diclofenac sodium (VOLTAREN) 1 % transdermal gel 1 application  1 application Topical QID Lawal Garba   1 application at 09/15/11 2256  . dicyclomine (BENTYL) capsule 10 mg  10 mg Oral TID AC & HS Lawal Garba      . diphenhydrAMINE (BENADRYL) capsule 25 mg  25 mg Oral QHS PRN Ripudeep K Rai, MD      . enoxaparin (LOVENOX) injection 40 mg  40 mg Subcutaneous QHS Lawal Garba   40 mg at 09/15/11 2255  . ondansetron (ZOFRAN) injection 4 mg  4 mg Intravenous Once Benny Lennert, MD   4 mg at 09/15/11 2103  . pantoprazole (PROTONIX) EC tablet 40 mg  40 mg Oral Daily  Lawal Garba      . rosuvastatin (CRESTOR) tablet 5 mg  5 mg Oral QHS Lawal Garba      . sodium chloride 0.9 % injection 3 mL  3 mL Intravenous Q12H Lawal Garba      . sodium chloride 0.9 % injection 3 mL  3 mL Intravenous PRN Lawal Garba      . vitamin C (ASCORBIC ACID) tablet 1,000 mg  1,000 mg Oral Daily Lawal Garba      . DISCONTD: aspirin tablet 81 mg  81 mg Oral Daily Lawal Garba      . DISCONTD: calcium citrate-vitamin D 200-200 MG-UNIT per tablet 1 tablet  1 tablet Oral BID Lawal Garba      . DISCONTD: diphenhydrAMINE (SOMINEX) tablet 25 mg  25 mg Oral QHS PRN Lawal Garba       Medications Prior to Admission  Medication Sig Dispense Refill  . ALPRAZolam (XANAX) 0.25 MG tablet Take 0.25 mg by mouth at bedtime as needed. For anxiety      . Ascorbic Acid (VITAMIN C PO) Take 1,000 mg by mouth daily.       Marland Kitchen aspirin 81 MG tablet Take 81 mg by mouth daily.        . clopidogrel (PLAVIX) 75 MG tablet Take 75 mg by mouth daily.        . diclofenac sodium (VOLTAREN) 1 % GEL Apply 1  application topically 4 (four) times daily.       Marland Kitchen dicyclomine (BENTYL) 10 MG capsule Take 10 mg by mouth 4 (four) times daily -  before meals and at bedtime. For colon      . diphenhydrAMINE (SOMINEX) 25 MG tablet Take 25 mg by mouth at bedtime as needed. For allergy      . esomeprazole (NEXIUM) 40 MG capsule Take 40 mg by mouth daily before breakfast.        . Polyethyl Glycol-Propyl Glycol (SYSTANE OP) Apply to eye.        . rosuvastatin (CRESTOR) 5 MG tablet Take 5 mg by mouth daily.          Results for orders placed during the hospital encounter of 09/15/11 (from the past 48 hour(s))  PROTIME-INR     Status: Normal   Collection Time   09/15/11  5:50 PM      Component Value Range Comment   Prothrombin Time 13.1  11.6 - 15.2 (seconds)    INR 0.97  0.00 - 1.49    APTT     Status: Normal   Collection Time   09/15/11  5:50 PM      Component Value Range Comment   aPTT 32  24 - 37 (seconds)   CBC     Status: Abnormal   Collection Time   09/15/11  5:50 PM      Component Value Range Comment   WBC 10.6 (*) 4.0 - 10.5 (K/uL)    RBC 4.19 (*) 4.22 - 5.81 (MIL/uL)    Hemoglobin 13.4  13.0 - 17.0 (g/dL)    HCT 16.1  09.6 - 04.5 (%)    MCV 93.3  78.0 - 100.0 (fL)    MCH 32.0  26.0 - 34.0 (pg)    MCHC 34.3  30.0 - 36.0 (g/dL)    RDW 40.9  81.1 - 91.4 (%)    Platelets 172  150 - 400 (K/uL)   DIFFERENTIAL     Status: Abnormal   Collection Time   09/15/11  5:50 PM  Component Value Range Comment   Neutrophils Relative 69  43 - 77 (%)    Neutro Abs 7.3  1.7 - 7.7 (K/uL)    Lymphocytes Relative 9 (*) 12 - 46 (%)    Lymphs Abs 1.0  0.7 - 4.0 (K/uL)    Monocytes Relative 19 (*) 3 - 12 (%)    Monocytes Absolute 2.0 (*) 0.1 - 1.0 (K/uL)    Eosinophils Relative 2  0 - 5 (%)    Eosinophils Absolute 0.2  0.0 - 0.7 (K/uL)    Basophils Relative 0  0 - 1 (%)    Basophils Absolute 0.0  0.0 - 0.1 (K/uL)   COMPREHENSIVE METABOLIC PANEL     Status: Abnormal   Collection Time   09/15/11  5:50 PM       Component Value Range Comment   Sodium 133 (*) 135 - 145 (mEq/L)    Potassium 4.4  3.5 - 5.1 (mEq/L)    Chloride 98  96 - 112 (mEq/L)    CO2 22  19 - 32 (mEq/L)    Glucose, Bld 161 (*) 70 - 99 (mg/dL)    BUN 5 (*) 6 - 23 (mg/dL)    Creatinine, Ser 1.61  0.50 - 1.35 (mg/dL)    Calcium 9.5  8.4 - 10.5 (mg/dL)    Total Protein 7.8  6.0 - 8.3 (g/dL)    Albumin 4.4  3.5 - 5.2 (g/dL)    AST 41 (*) 0 - 37 (U/L) SLIGHT HEMOLYSIS   ALT 25  0 - 53 (U/L)    Alkaline Phosphatase 79  39 - 117 (U/L)    Total Bilirubin 0.3  0.3 - 1.2 (mg/dL)    GFR calc non Af Amer 75 (*) >90 (mL/min)    GFR calc Af Amer 87 (*) >90 (mL/min)   CK TOTAL AND CKMB     Status: Abnormal   Collection Time   09/15/11  5:51 PM      Component Value Range Comment   Total CK 433 (*) 7 - 232 (U/L)    CK, MB 6.4 (*) 0.3 - 4.0 (ng/mL)    Relative Index 1.5  0.0 - 2.5    TROPONIN I     Status: Normal   Collection Time   09/15/11  5:51 PM      Component Value Range Comment   Troponin I <0.30  <0.30 (ng/mL)   CBC     Status: Abnormal   Collection Time   09/15/11 10:06 PM      Component Value Range Comment   WBC 9.6  4.0 - 10.5 (K/uL)    RBC 3.76 (*) 4.22 - 5.81 (MIL/uL)    Hemoglobin 11.7 (*) 13.0 - 17.0 (g/dL)    HCT 09.6 (*) 04.5 - 52.0 (%)    MCV 93.1  78.0 - 100.0 (fL)    MCH 31.1  26.0 - 34.0 (pg)    MCHC 33.4  30.0 - 36.0 (g/dL)    RDW 40.9  81.1 - 91.4 (%)    Platelets 189  150 - 400 (K/uL)   CREATININE, SERUM     Status: Abnormal   Collection Time   09/15/11 10:06 PM      Component Value Range Comment   Creatinine, Ser 0.95  0.50 - 1.35 (mg/dL)    GFR calc non Af Amer 74 (*) >90 (mL/min)    GFR calc Af Amer 86 (*) >90 (mL/min)    Dg Chest 2 View  09/15/2011  *RADIOLOGY  REPORT*  Clinical Data: Pain, shortness of breath.  CHEST - 2 VIEW  Comparison: 04/06/2011  Findings: Heart is upper limits normal in size.  Lungs are clear. No effusions.  No acute bony abnormality.  IMPRESSION: No active disease.  Original  Report Authenticated By: Cyndie Chime, M.D.   Ct Head Wo Contrast  09/15/2011  *RADIOLOGY REPORT*  Clinical Data: Dizziness with nausea and vomiting.  History of stroke.  Code stroke.  CT HEAD WITHOUT CONTRAST  Technique:  Contiguous axial images were obtained from the base of the skull through the vertex without contrast.  Comparison: Head CT 04/06/2011.  MR of the brain 04/06/2011.  Findings: There is stable chronic encephalomalacia in the posterior left parietal lobe.  Scattered small vessel ischemic changes in the periventricular white matter are stable.  There is no evidence of acute intracranial hemorrhage, mass lesion, brain edema or extra- axial fluid collection.  There is no CT evidence of acute stroke.  The visualized paranasal sinuses are clear.  The calvarium is intact.  Intracranial vascular calcifications are noted.  IMPRESSION:  1.  No CT evidence of acute stroke or hemorrhage. 2.  Stable chronic small vessel ischemic changes and left parietal encephalomalacia.  Critical Value/emergent results were called by telephone at the time of interpretation on 09/15/2011  at 1750 hours  to  Dr. Estell Harpin, who verbally acknowledged these results.  Original Report Authenticated By: Gerrianne Scale, M.D.    Review of Systems  Constitutional: Positive for malaise/fatigue.  HENT: Negative.   Eyes: Negative.   Respiratory: Negative.   Cardiovascular: Negative.   Gastrointestinal: Negative.   Genitourinary: Negative.   Musculoskeletal: Negative.   Skin: Negative.   Neurological: Positive for focal weakness and weakness. Negative for dizziness, tingling, tremors, sensory change, speech change, seizures and loss of consciousness.  Psychiatric/Behavioral: Negative.     Blood pressure 119/59, pulse 72, temperature 99.6 F (37.6 C), temperature source Oral, resp. rate 20, height 5\' 10"  (1.778 m), weight 78.019 kg (172 lb), SpO2 96.00%. Physical Exam  Constitutional: He is oriented to person, place,  and time.       Frail  HENT:  Head: Atraumatic.  Right Ear: External ear normal.  Left Ear: External ear normal.  Eyes: Conjunctivae and EOM are normal. Pupils are equal, round, and reactive to light.  Neck: Normal range of motion. Neck supple.  Cardiovascular: Normal rate, regular rhythm, normal heart sounds and intact distal pulses.   Respiratory: Effort normal and breath sounds normal.  GI: Soft. Bowel sounds are normal.  Musculoskeletal: Normal range of motion.  Neurological: He is alert and oriented to person, place, and time. He displays normal reflexes. No cranial nerve deficit. He exhibits normal muscle tone. Coordination abnormal.  Skin: Skin is warm and dry.  Psychiatric: He has a normal mood and affect.     Assessment/Plan 1. TIA: Symptoms resolved but will do a full work up of stroke due to past history and risk factors. 2. HTN: controlled 3.Hyponatremia: Mild, hydrate with saline 4. DM2: Non-insulin dependent. Continue home medications with SSI Leucocytosis: No fever. Cause not clear. May be Stress related.  GARBA,LAWAL 09/16/2011, 3:53 AM

## 2011-09-16 NOTE — Progress Notes (Signed)
*  PRELIMINARY RESULTS* Echocardiogram 2D Echocardiogram has been performed.  Clide Deutscher RDCS 09/16/2011, 10:35 AM

## 2011-09-16 NOTE — Progress Notes (Signed)
*  PRELIMINARY RESULTS*  Carotid Doppler has been performed. Left =  60-79% internal carotid artery stenosis. Right = No significant ICA stenosis.    Farrel Demark 09/16/2011, 9:45 AM

## 2011-09-17 ENCOUNTER — Other Ambulatory Visit (HOSPITAL_COMMUNITY): Payer: Medicare Other

## 2011-09-17 ENCOUNTER — Inpatient Hospital Stay (HOSPITAL_COMMUNITY): Admission: RE | Admit: 2011-09-17 | Payer: Medicare Other | Source: Ambulatory Visit

## 2011-09-17 LAB — GLUCOSE, CAPILLARY: Glucose-Capillary: 205 mg/dL — ABNORMAL HIGH (ref 70–99)

## 2011-09-17 LAB — URINE CULTURE

## 2011-09-17 MED ORDER — POLYETHYLENE GLYCOL 3350 17 G PO PACK
17.0000 g | PACK | Freq: Once | ORAL | Status: DC
  Filled 2011-09-17: qty 1

## 2011-09-17 MED ORDER — POLYETHYLENE GLYCOL 3350 17 G PO PACK: 17.0000 g | PACK | Freq: Once | ORAL | Status: AC

## 2011-09-17 NOTE — Progress Notes (Signed)
Patient ID: Gregory Padilla, male   DOB: 10/31/1924, 77 y.o.   MRN: [id number] Pt. Discharged 09/17/2011  3:12 PM Discharge instructions reviewed with patient/family. Patient/family verbalized understanding. All Rx's given. Questions answered as needed. Pt. Discharged to home with family/self. Taken off unit via W/C. Lurline Idol [facility]

## 2011-09-17 NOTE — Progress Notes (Signed)
Subjective: This is an 77 year old gentleman with a history of cerebrovascular disease, and prior stroke. The patient has been treated with aspirin and Plavix, and reports a gradual onset of problems with weakness in both legs, and increasing difficulty walking going on for several weeks prior to admission. The patient has a sensation that the legs will buckle underneath him. The patient has also noted some issues with slurring of speech, but no trouble swallowing. The patient does have a history of low back pain and bilateral leg pain from the hips down to the knees. The day of admission, the patient had been coughing frequently the night before, and took one dose of antibiotics. The patient began having significant nausea and vomiting, and increased generalized weakness. MRI evaluation during this hospitalization does not show evidence of an acute stroke. The patient currently feels stronger. The patient is eating and drinking fairly well.  Objective: Vital signs in last 24 hours: Temp:  [98 F (36.7 C)-99.7 F (37.6 C)] 99 F (37.2 C) (12/08 0836) Pulse Rate:  [56-76] 71  (12/08 0836) Resp:  [16-20] 18  (12/08 0836) BP: (115-128)/(55-73) 127/70 mmHg (12/08 0836) SpO2:  [95 %-97 %] 95 % (12/08 0836) Weight change:  Last BM Date: 08/15/11  Intake/Output from previous day: 12/07 0701 - 12/08 0700 In: 840 [P.O.:840] Out: 2600 [Urine:2600] Intake/Output this shift: Total I/O In: 360 [P.O.:360] Out: 350 [Urine:350]  @ROS @ The patient denies headache, denies neck pain. The patient does have low back pain. The patient denies chest pain, shortness of breath, palpitations. The patient currently does not report nausea and vomiting. The patient does have some problems with bladder control following a TURP. The patient has chronic constipation issues.   Physical Examination:  Mental Status Exam: The patient is alert and cooperative at the time of the examination. The patient is oriented  x3.  Motor Exam: The patient moves all 4 extremities, and has good strength bilaterally. No drift of the upper extremities is noted.  Cerebellar Exam: The patient has good finger-nose-finger and heel-to-shin bilaterally. Gait was not tested.  Deep tendon reflexes: Deep tendon reflexes are symmetric throughout. Toes are downgoing bilaterally.  Cranial Nerve Exam: Facial symmetry is not present. There is a mild left facial droop Extraocular movements are full. Visual fields are full. Speech is well enunciated, no aphasia or dysarthria is noted.    Lab Results:  [facility] 09/15/11 2206 09/15/11 1750  WBC 9.6 10.6*  HGB 11.7* 13.4  HCT 35.0* 39.1  PLT 189 172   BMET  Basename 09/15/11 2206 09/15/11 1750  NA -- 133*  K -- 4.4  CL -- 98  CO2 -- 22  GLUCOSE -- 161*  BUN -- 5*  CREATININE 0.95 0.92  CALCIUM -- 9.5    Studies/Results: Dg Chest 2 View  09/15/2011  *RADIOLOGY REPORT*  Clinical Data: Pain, shortness of breath.  CHEST - 2 VIEW  Comparison: 04/06/2011  Findings: Heart is upper limits normal in size.  Lungs are clear. No effusions.  No acute bony abnormality.  IMPRESSION: No active disease.  Original Report Authenticated By: Cyndie Chime, M.D.   Ct Head Wo Contrast  09/15/2011  *RADIOLOGY REPORT*  Clinical Data: Dizziness with nausea and vomiting.  History of stroke.  Code stroke.  CT HEAD WITHOUT CONTRAST  Technique:  Contiguous axial images were obtained from the base of the skull through the vertex without contrast.  Comparison: Head CT 04/06/2011.  MR of the brain 04/06/2011.  Findings: There is stable chronic  encephalomalacia in the posterior left parietal lobe.  Scattered small vessel ischemic changes in the periventricular white matter are stable.  There is no evidence of acute intracranial hemorrhage, mass lesion, brain edema or extra- axial fluid collection.  There is no CT evidence of acute stroke.  The visualized paranasal sinuses are clear.  The calvarium is  intact.  Intracranial vascular calcifications are noted.  IMPRESSION:  1.  No CT evidence of acute stroke or hemorrhage. 2.  Stable chronic small vessel ischemic changes and left parietal encephalomalacia.  Critical Value/emergent results were called by telephone at the time of interpretation on 09/15/2011  at 1750 hours  to  Dr. Estell Harpin, who verbally acknowledged these results.  Original Report Authenticated By: Gerrianne Scale, M.D.   Mri Brain Without Contrast  09/17/2011  *RADIOLOGY REPORT*  Clinical Data:  TIA.  Dizziness and nausea.  History of prior CVA. The acute symptoms have resolved.  MRI HEAD WITHOUT CONTRAST MRA HEAD WITHOUT CONTRAST  Technique:  Multiplanar, multiecho pulse sequences of the brain and surrounding structures were obtained without intravenous contrast. Angiographic images of the head were obtained using MRA technique without contrast.  Comparison:  CT head without contrast 09/15/2011 at [facility].  MRI brain and MRA circle of Willis 04/06/2011 at [facility].  MRI HEAD  Findings:  The diffusion weighted images demonstrate no evidence for acute or subacute infarction.  A remote left parietal infarct is again noted. A remote lacunar infarct is again seen within the left cerebellum.  Mild atrophy and moderate white matter disease is stable bilaterally.  Flow is present in the major intracranial arteries.  The globes and orbits are intact.  Mild mucosal thickening is evident within the ethmoid air cells bilaterally. The paranasal sinuses and mastoid air cells are otherwise clear.  IMPRESSION:  1.  No acute intracranial abnormality or significant interval change. 2. The stable remote infarcts of the left parietal lobe and cerebellum. 3.  Stable periventricular subcortical white matter changes bilaterally, likely reflecting sequelae of chronic microvascular ischemia.  MRA HEAD  Findings: There is moderate irregularity within the supraclinoid internal carotid arteries  bilaterally.  Stenosis of the left supraclinoid ICA measures proximally 50% relative to the ICA terminus.  The A1 and M1 segments are normal.  There is mild narrowing of distal MCA branch vessels bilaterally.  The anterior communicating artery is patent.  The right vertebral artery is the dominant vessel.  The PICA origins are visualized and normal bilaterally.  The basilar artery is within normal limits.  Both posterior cerebral arteries originate from the basilar tip.  There is attenuation of distal PCA branch vessels bilaterally.  IMPRESSION:  1.  Mild atherosclerotic irregularity of the supraclinoid internal carotid arteries bilaterally with approximately 50% stenosis on the left. 2.  Mild moderate small vessel disease.  No significant proximal stenosis, aneurysm, or branch vessel occlusion is evident.  Original Report Authenticated By: Jamesetta Orleans. MATTERN, M.D.   Mr Maxine Glenn Head/brain Wo Cm  09/17/2011  *RADIOLOGY REPORT*  Clinical Data:  TIA.  Dizziness and nausea.  History of prior CVA. The acute symptoms have resolved.  MRI HEAD WITHOUT CONTRAST MRA HEAD WITHOUT CONTRAST  Technique:  Multiplanar, multiecho pulse sequences of the brain and surrounding structures were obtained without intravenous contrast. Angiographic images of the head were obtained using MRA technique without contrast.  Comparison:  CT head without contrast 09/15/2011 at [facility].  MRI brain and MRA circle of Willis 04/06/2011 at [facility].  MRI HEAD  Findings:  The diffusion weighted images demonstrate no evidence for acute or subacute infarction.  A remote left parietal infarct is again noted. A remote lacunar infarct is again seen within the left cerebellum.  Mild atrophy and moderate white matter disease is stable bilaterally.  Flow is present in the major intracranial arteries.  The globes and orbits are intact.  Mild mucosal thickening is evident within the ethmoid air cells bilaterally. The paranasal sinuses  and mastoid air cells are otherwise clear.  IMPRESSION:  1.  No acute intracranial abnormality or significant interval change. 2. The stable remote infarcts of the left parietal lobe and cerebellum. 3.  Stable periventricular subcortical white matter changes bilaterally, likely reflecting sequelae of chronic microvascular ischemia.  MRA HEAD  Findings: There is moderate irregularity within the supraclinoid internal carotid arteries bilaterally.  Stenosis of the left supraclinoid ICA measures proximally 50% relative to the ICA terminus.  The A1 and M1 segments are normal.  There is mild narrowing of distal MCA branch vessels bilaterally.  The anterior communicating artery is patent.  The right vertebral artery is the dominant vessel.  The PICA origins are visualized and normal bilaterally.  The basilar artery is within normal limits.  Both posterior cerebral arteries originate from the basilar tip.  There is attenuation of distal PCA branch vessels bilaterally.  IMPRESSION:  1.  Mild atherosclerotic irregularity of the supraclinoid internal carotid arteries bilaterally with approximately 50% stenosis on the left. 2.  Mild moderate small vessel disease.  No significant proximal stenosis, aneurysm, or branch vessel occlusion is evident.  Original Report Authenticated By: Jamesetta Orleans. MATTERN, M.D.    Medications:  Scheduled:   . aspirin EC  81 mg Oral Daily  . calcium-vitamin D  1 tablet Oral BID  . clopidogrel  75 mg Oral QAC breakfast  . diclofenac sodium  1 application Topical QID  . dicyclomine  10 mg Oral TID AC & HS  . enoxaparin  40 mg Subcutaneous QHS  . pantoprazole  40 mg Oral Daily  . polyethylene glycol  17 g Oral Once  . rosuvastatin  5 mg Oral QHS  . sodium chloride  3 mL Intravenous Q12H  . vitamin C  1,000 mg Oral Daily   Continuous:  ZOX:WRUEAV chloride, acetaminophen, ALPRAZolam, diphenhydrAMINE, sodium chloride PERFORMING [facility] ORDERING Garba, Lawal ADMITTING Rai,  Ripudeep ATTENDING Rai, Ripudeep SONOGRAPHER Tobey Grim, RDCS cc:  ------------------------------------------------------------ LV EF: 55% - 60%  ------------------------------------------------------------ Indications: TIA 435.9.  ------------------------------------------------------------ History: Risk factors: Diabetes mellitus.  ------------------------------------------------------------ Study Conclusions  - Left ventricle: The cavity size was normal. Wall thickness was increased in a pattern of mild LVH. Systolic function was normal. The estimated ejection fraction was in the range of 55% to 60%. Wall motion was normal; there were no regional wall motion abnormalities. - Aortic valve: Sclerosis without stenosis. Mean gradient: 12mm Hg (S). Peak gradient: 19mm Hg (S). Valve area: 1.78cm^2(VTI). Valve area: 1.78cm^2 (Vmax). - Mitral valve: Calcified annulus. - Pulmonary arteries: PA peak pressure: 33mm Hg (S). - Impressions: No change since study of June,2012. Impressions:  - No change since study of June,2012. Transthoracic echocardiography. M-mode, complete 2D, spectral Doppler, and color Doppler. Height: Height: 177.8cm. Height: 70in. Weight: Weight: 78kg. Weight: 171.6lb. Body mass index: BMI: 24.7kg/m^2. Body surface area: BSA: 1.13m^2. Blood pressure: 127/59. Patient status: Inpatient. Location: Echo laboratory.  ------------------------------------------------------------  ------------------------------------------------------------ Left ventricle: The cavity size was normal. Wall thickness was increased in a pattern of mild LVH. Systolic function  was normal. The estimated ejection fraction was in the range of 55% to 60%. Wall motion was normal; there were no regional wall motion abnormalities.  ------------------------------------------------------------ Aortic valve: Sclerosis without stenosis. Doppler: No significant regurgitation. VTI ratio of LVOT  to aortic valve: 0.57. Valve area: 1.78cm^2(VTI). Indexed valve area: 0.91cm^2/m^2 (VTI). Peak velocity ratio of LVOT to aortic valve: 0.57. Valve area: 1.78cm^2 (Vmax). Indexed valve area: 0.91cm^2/m^2 (Vmax). Mean gradient: 12mm Hg (S). Peak gradient: 19mm Hg (S).  ------------------------------------------------------------ Aorta: Aortic root: The aortic root was normal in size.  ------------------------------------------------------------ Mitral valve: Calcified annulus. Doppler: Trivial regurgitation. Peak gradient: 4mm Hg (D).  ------------------------------------------------------------ Left atrium: The atrium was normal in size.  ------------------------------------------------------------ Right ventricle: The cavity size was normal. Systolic function was normal.  ------------------------------------------------------------ Pulmonic valve: The valve appears to be grossly normal. Doppler: No significant regurgitation.  ------------------------------------------------------------ Tricuspid valve: Structurally normal valve. Leaflet separation was normal. Doppler: Transvalvular velocity was within the normal range. No regurgitation.  ------------------------------------------------------------ Right atrium: The atrium was at the upper limits of normal in size.  ------------------------------------------------------------ Pericardium: There was no pericardial effusion.     Assessment/Plan:  1. History of cerebrovascular disease  2. Gradual onset of dysarthria, gait disorder  The patient does not appear to of had an acute stroke event. The history suggests a gradual slow onset of some changes in speech, and with walking. The patient reports bilateral lower extremity weakness. Etiology of the changes is not clear. The patient does have low back pain and some pain into both legs. Patient will remain on aspirin and Plavix. The patient will require some physical and  occupational therapy. The patient will be set up for a MRI evaluation of the cervical spine and lumbosacral spine. 2-D echocardiogram results are unremarkable.  At this time, the stroke service will sign off, and followup neurologic care will be to the general neurologic service.    LOS: 2 days   WILLIS,CHARLES KEITH 09/17/2011, 12:48 PM

## 2011-09-17 NOTE — Discharge Summary (Signed)
Physician Discharge Summary  Patient ID: Gregory Padilla MRN: [id number] DOB/AGE: [birth year] 77 y.o.  Admit date: 09/15/2011 Discharge date: 09/17/2011  Primary Care Physician:  Raliegh Ip, MD  Discharge Diagnoses:   Present on Admission:  .TIA (transient ischemic attack) .Diabetes mellitus .Leucocytosis .Hyponatremia Constipation  Consults: Neurology   Discharge Medications: Discharge Medication List as of 09/17/2011 12:48 PM    START taking these medications   Details  polyethylene glycol (MIRALAX / GLYCOLAX) packet Take 17 g by mouth once., Starting 09/17/2011, Until Tue 09/20/11, Print      CONTINUE these medications which have NOT CHANGED   Details  acetaminophen (TYLENOL) 500 MG tablet Take 500 mg by mouth every 6 (six) hours as needed. For fever  , Until Discontinued, Historical Med    ALPRAZolam (XANAX) 0.25 MG tablet Take 0.25 mg by mouth at bedtime as needed. For anxiety, Until Discontinued, Historical Med    Ascorbic Acid (VITAMIN C PO) Take 1,000 mg by mouth daily. , Until Discontinued, Historical Med    aspirin 81 MG tablet Take 81 mg by mouth daily.  , Until Discontinued, Historical Med    calcium citrate-vitamin D 200-200 MG-UNIT TABS Take 1 tablet by mouth 2 (two) times daily.  , Until Discontinued, Historical Med    clopidogrel (PLAVIX) 75 MG tablet Take 75 mg by mouth daily.  , Until Discontinued, Historical Med    diclofenac sodium (VOLTAREN) 1 % GEL Apply 1 application topically 4 (four) times daily. , Until Discontinued, Historical Med    dicyclomine (BENTYL) 10 MG capsule Take 10 mg by mouth 4 (four) times daily -  before meals and at bedtime. For colon, Until Discontinued, Historical Med    diphenhydrAMINE (SOMINEX) 25 MG tablet Take 25 mg by mouth at bedtime as needed. For allergy, Until Discontinued, Historical Med    esomeprazole (NEXIUM) 40 MG capsule Take 40 mg by mouth daily before breakfast.  , Until Discontinued, Historical Med    Polyethyl Glycol-Propyl Glycol (SYSTANE OP) Apply to eye.  , Until Discontinued, Historical Med    Polyethyl Glycol-Propyl Glycol (SYSTANE OP) Apply 1 drop to eye daily. 1 drop each eye daily , Until Discontinued, Historical Med    rosuvastatin (CRESTOR) 5 MG tablet Take 5 mg by mouth daily.  , Until Discontinued, Historical Med         Brief H and P: For complete details please refer to admission H and P, but in brief patient is 77 year old male with multiple medical problems including prior stroke who was brought in with a code stroke due to sudden weakness and suspected stroke. Symptoms had resolved on arrival in the ED. Patient had been feeling generally unwell for 2 days prior to admission patient has residual left-sided weakness from prior stroke but able to function. Neurology was consulted in the emergency room but due to symptoms resolution patient was admitted to the hospitalist service for further workup.   Hospital Course:  Principal Problem:  TIA (transient ischemic attack):  Patient was admitted by hospitalist service to the telemetry floor, stroke workup was initiated. MRI of the head showed no acute intracranial malady or significant interval change, stable remote infarcts of the left parietal lobe and cerebellum, chronic microvascular ischemia. MRA showed mild atherosclerotic irregularity of the supraclinoid internal carotid arteries bilaterally with approximate 50% stenosis on the left. 2-D echo showed EF of 55-60% with normal wall motion. Carotid Dopplers were done which showed 60-79% internal carotid artery stenosis on the left and no significant ICA  stenosis of the right. I discussed with vascular surgery (Dr. Myra Gianotti) who recommended 6 monthly carotid Dopplers checking with PCP and patient's outpatient neurologist. Patient was continued on aspirin Plavix and statins.  Active Problems:  Diabetes mellitus: CBG's remained controlled  Leucocytosis: Possibly stress  demargination, improved  Hyponatremia: Sodium 133 asymptomatic  Constipation: Patient was provided with the MiraLax   Day of Discharge BP 127/70  Pulse 71  Temp(Src) 99 F (37.2 C) (Oral)  Resp 18  Ht 5\' 10"  (1.778 m)  Wt 78.019 kg (172 lb)  BMI 24.68 kg/m2  SpO2 95%  Physical Exam: General: Alert and awake oriented x3 not in any acute distress. HEENT: anicteric sclera, pupils reactive to light and accommodation CVS: S1-S2 clear no murmur rubs or gallops Chest: clear to auscultation bilaterally, no wheezing rales or rhonchi Abdomen: soft nontender, nondistended, normal bowel sounds, no organomegaly Extremities: no cyanosis, clubbing or edema noted bilaterally Neuro: Cranial nerves II-XII intact, no focal neurological deficits   The results of significant diagnostics from this hospitalization (including imaging, microbiology, ancillary and laboratory) are listed below for reference.    LAB RESULTS: Basic Metabolic Panel:  Lab 09/15/11 7829 09/15/11 1750  NA -- 133*  K -- 4.4  CL -- 98  CO2 -- 22  GLUCOSE -- 161*  BUN -- 5*  CREATININE 0.95 0.92  CALCIUM -- 9.5  MG -- --  PHOS -- --   Liver Function Tests:  Lab 09/15/11 1750  AST 41*  ALT 25  ALKPHOS 79  BILITOT 0.3  PROT 7.8  ALBUMIN 4.4   CBC:  Lab 09/15/11 2206 09/15/11 1750  WBC 9.6 10.6*  NEUTROABS -- 7.3  HGB 11.7* 13.4  HCT 35.0* 39.1  MCV 93.1 --  PLT 189 172   Cardiac Enzymes:  Lab 09/15/11 1751  CKTOTAL 433*  CKMB 6.4*  CKMBINDEX --  TROPONINI <0.30   CBG:  Lab 09/17/11 1203 09/17/11 0838  GLUCAP 171* 205*    Significant Diagnostic Studies:  Dg Chest 2 View  09/15/2011  *RADIOLOGY REPORT*  Clinical Data: Pain, shortness of breath.  CHEST - 2 VIEW  Comparison: 04/06/2011  Findings: Heart is upper limits normal in size.  Lungs are clear. No effusions.  No acute bony abnormality.  IMPRESSION: No active disease.  Original Report Authenticated By: Cyndie Chime, M.D.   Ct Head Wo  Contrast  09/15/2011  *RADIOLOGY REPORT*  Clinical Data: Dizziness with nausea and vomiting.  History of stroke.  Code stroke.  CT HEAD WITHOUT CONTRAST  Technique:  Contiguous axial images were obtained from the base of the skull through the vertex without contrast.  Comparison: Head CT 04/06/2011.  MR of the brain 04/06/2011.  Findings: There is stable chronic encephalomalacia in the posterior left parietal lobe.  Scattered small vessel ischemic changes in the periventricular white matter are stable.  There is no evidence of acute intracranial hemorrhage, mass lesion, brain edema or extra- axial fluid collection.  There is no CT evidence of acute stroke.  The visualized paranasal sinuses are clear.  The calvarium is intact.  Intracranial vascular calcifications are noted.  IMPRESSION:  1.  No CT evidence of acute stroke or hemorrhage. 2.  Stable chronic small vessel ischemic changes and left parietal encephalomalacia.  Critical Value/emergent results were called by telephone at the time of interpretation on 09/15/2011  at 1750 hours  to  Dr. Estell Harpin, who verbally acknowledged these results.  Original Report Authenticated By: Gerrianne Scale, M.D.  Disposition and Follow-up: Discharge Orders    Future Appointments: Provider: Department: Dept Phone: Center:   07/12/2012 2:00 PM [facility] [phone] [facility]     Future Orders Please Complete By Expires   Diet - low sodium heart healthy      Increase activity slowly      Discharge instructions      Comments:   PLEASE CHECK CAROTID DOPPLERS 6 MONTHLY WITH YOUR NEUROLOGIST AND Dr. Vear Clock       DISPOSITION: Home  DIET: Heart heavy diet   ACTIVITY: As tolerated  TESTS THAT NEED FOLLOW-UP Carotid Dopplers 6 monthly followups  DISCHARGE FOLLOW-UP Follow-up Information    Follow up with PHILLIPS,CHARLES W. Make an appointment in 10 days.         Time spent on Discharge: 45 minutes  Signed: Kyrus Hyde 09/17/2011,  2:59 PM

## 2011-09-22 LAB — CULTURE, BLOOD (ROUTINE X 2)
Culture  Setup Time: 201212070427
Culture: NO GROWTH

## 2011-11-03 IMAGING — CT CT PELVIS W/ CM
2 of 5 series · 13 of 32 positions shown, 18 images · IV contrast (water/omni  & 90 ml omni 300)
Comparison: Plain films abdomen 09/21/2009.

CT ABDOMEN

CLINICAL DATA: Abdominal pain.  Small bowel obstruction.

CT ABDOMEN AND PELVIS WITH CONTRAST
TECHNIQUE: Multidetector CT imaging of the abdomen and pelvis was
performed using the standard protocol following bolus
administration of intravenous contrast.
Contrast: 90 ml Pmnipaque-TMM.

[Series 2: routine abdomen · axial · 0.82mm/px · z∈[-470,-110]mm · 6 of 102 slices shown, 11 images]
[im 15/102  soft-tissue]
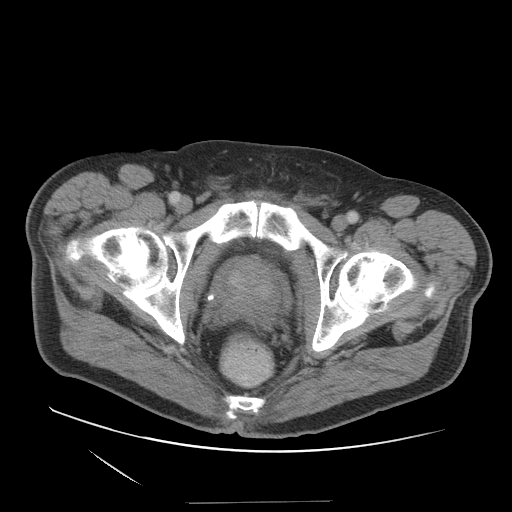
[im 15/102  bone]
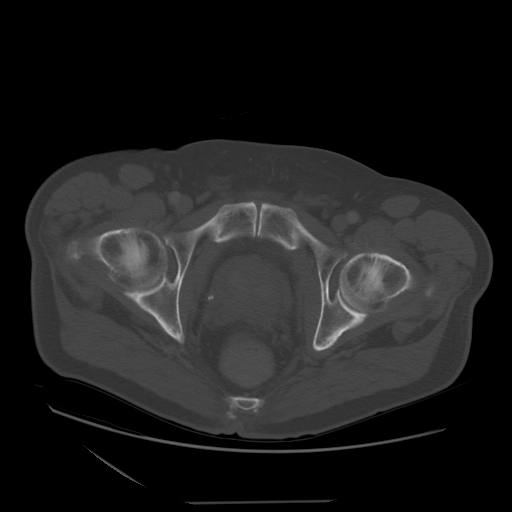
[im 29/102  soft-tissue]
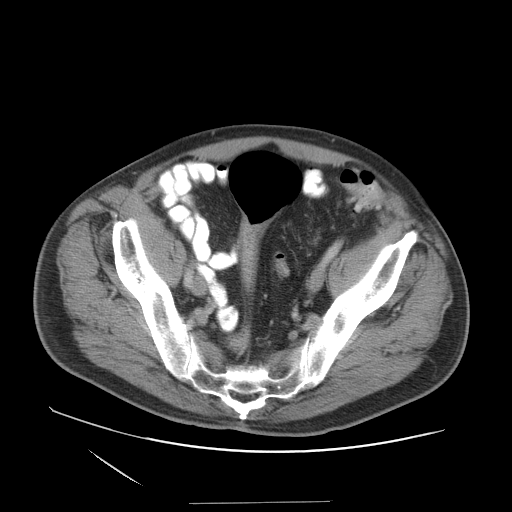
[im 44/102  soft-tissue]
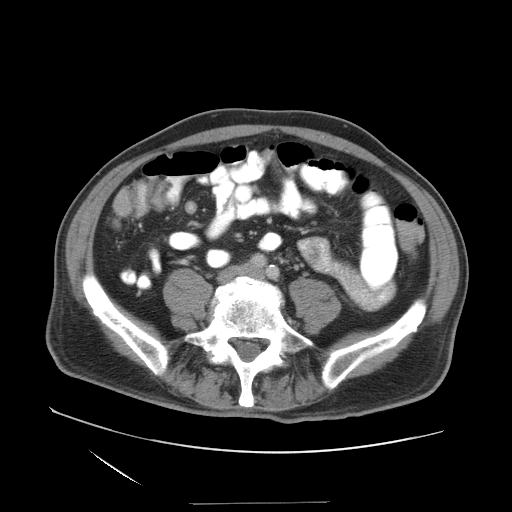
[im 44/102  lung]
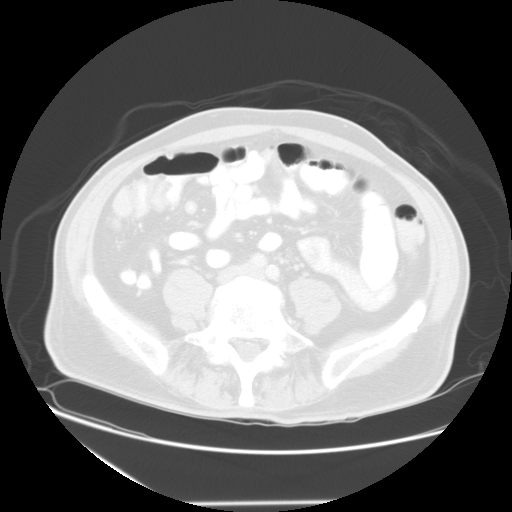
[im 58/102  soft-tissue]
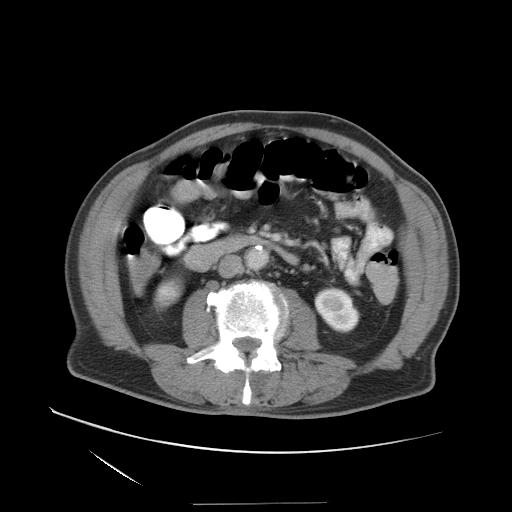
[im 58/102  lung]
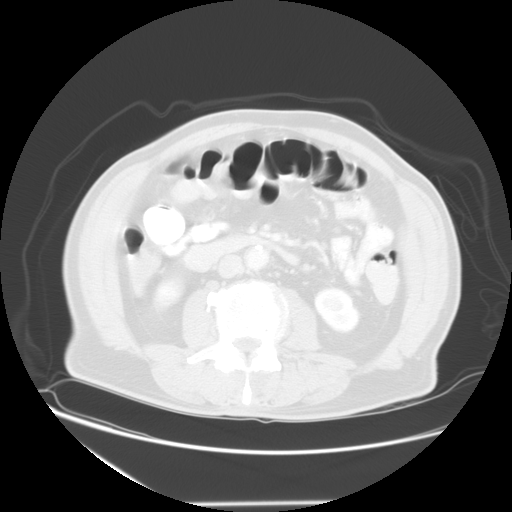
[im 73/102  soft-tissue]
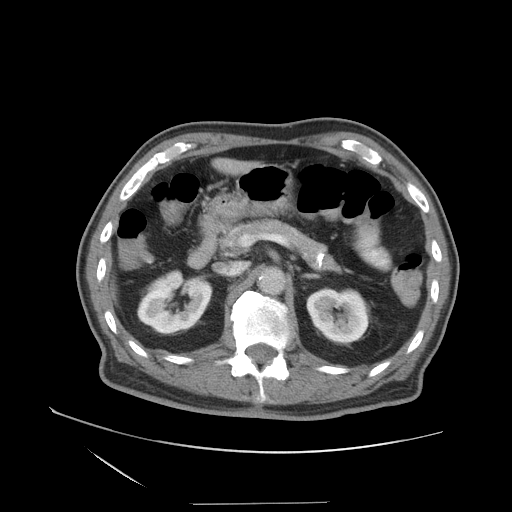
[im 73/102  lung]
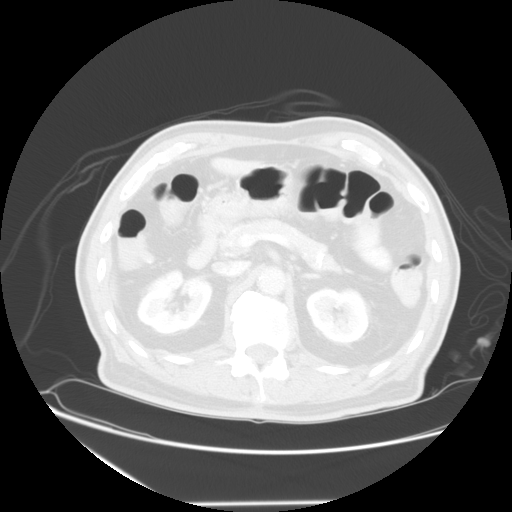
[im 87/102  soft-tissue]
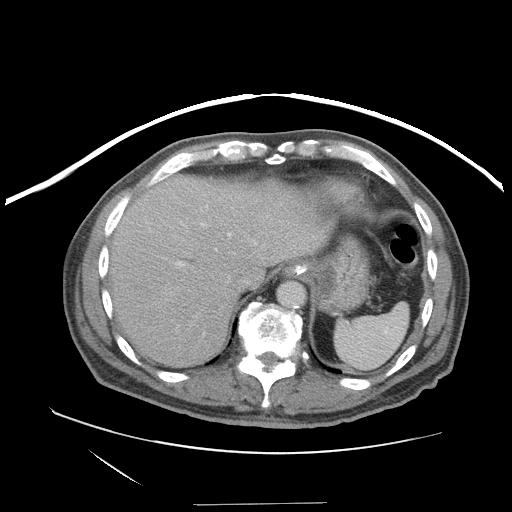
[im 87/102  lung]
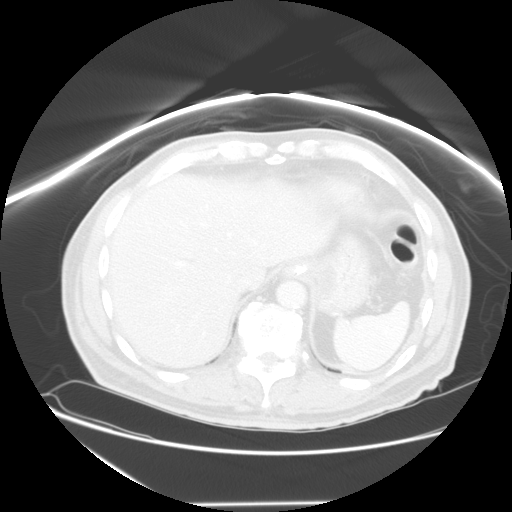

[Series 400: sagittal a/p · sagittal · 1.00mm/px · 7 of 93 slices shown]
[im 12/93  soft-tissue]
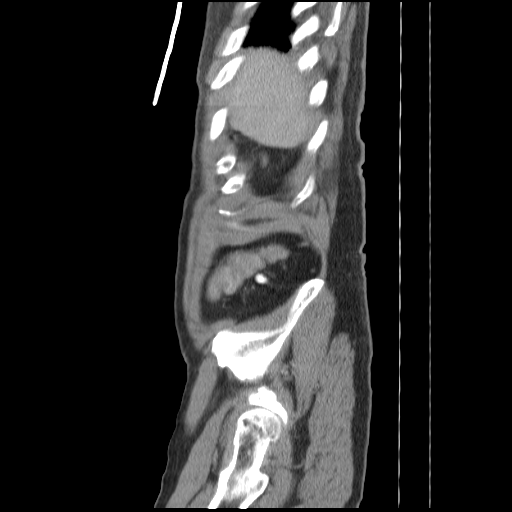
[im 24/93  soft-tissue]
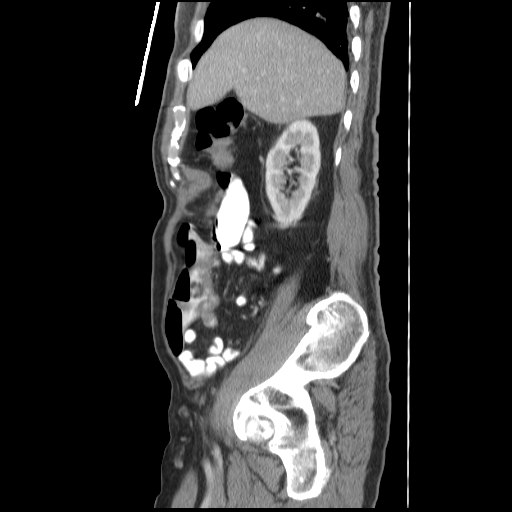
[im 35/93  soft-tissue]
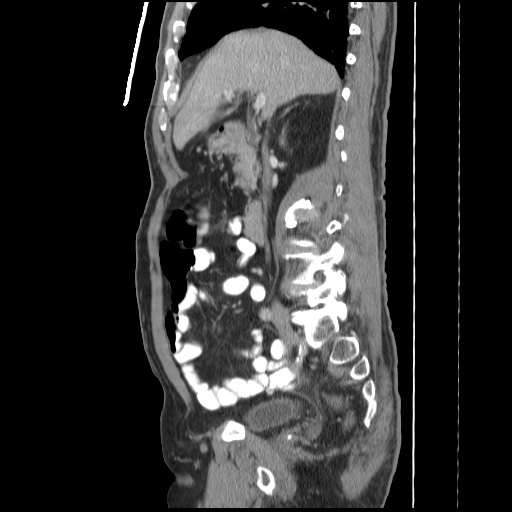
[im 47/93  soft-tissue]
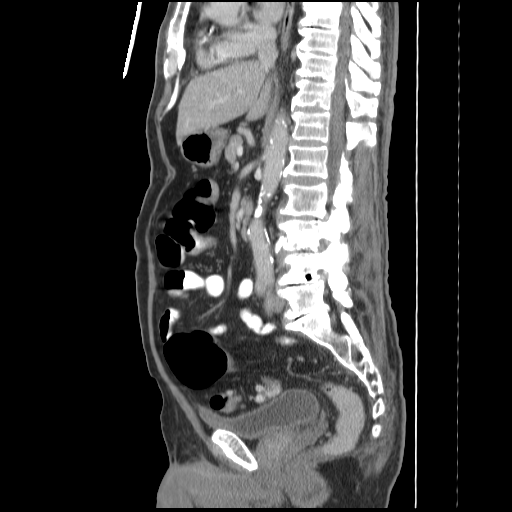
[im 58/93  soft-tissue]
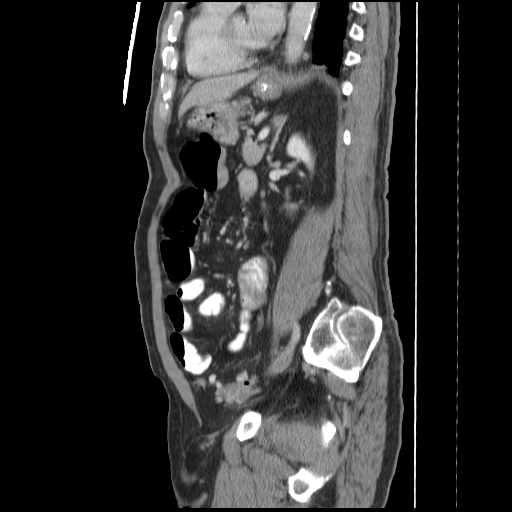
[im 70/93  soft-tissue]
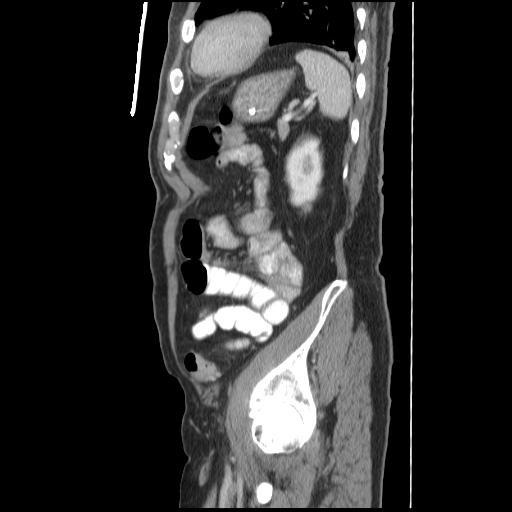
[im 81/93  soft-tissue]
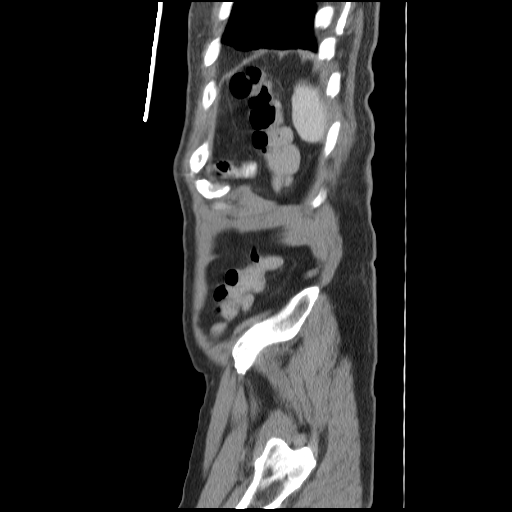

[13 of 32 positions shown; findings below may reference images not displayed]

FINDINGS: Dependent atelectatic change is seen in the lung bases.
No pleural or pericardial effusion.

NG tube is visualized with the tip in the stomach.  There is mild
dilatation of proximal small bowel loops with air-fluid levels
present.  Small bowel is dilated up to 3 cm. Distal loops are
completely decompressed and contain contrast material.  No
pneumatosis, portal venous gas or free intraperitoneal air is
identified.

The liver, spleen, adrenal glands, pancreas and left kidney are
unremarkable.  Small right renal cyst is noted.  There is scattered
atherosclerotic ossification nonaneurysmal aorta.  The patient
status post cholecystectomy.  No abdominal fluid.  Lumbar
spondylosis is noted.  No focal bony lesion.
IMPRESSION: Finding consistent with mild partial small bowel obstruction likely
due to adhesions.  No evidence of bowel ischemia.  No mass.

CT PELVIS
FINDINGS: The patient has extensive diverticular disease of the
sigmoid colon but there is no evidence of diverticulitis.  The
colon is otherwise unremarkable.  There is no pelvic fluid or
lymphadenopathy.  Prostate gland is enlarged.  No focal bony
abnormality.
IMPRESSION: 1.  No acute finding in the pelvis.
2.  Diverticulosis without diverticulitis.
3.  Enlarged prostate gland.

## 2011-11-04 IMAGING — CR DG ABDOMEN 2V
2 series · 2 of 2 positions shown · non-contrast
Comparison: CT scan 09/22/2009.

CLINICAL DATA: Abdominal discomfort.  Follow-up small bowel
obstruction.

ABDOMEN - 2 VIEW

[w abdomen upright]
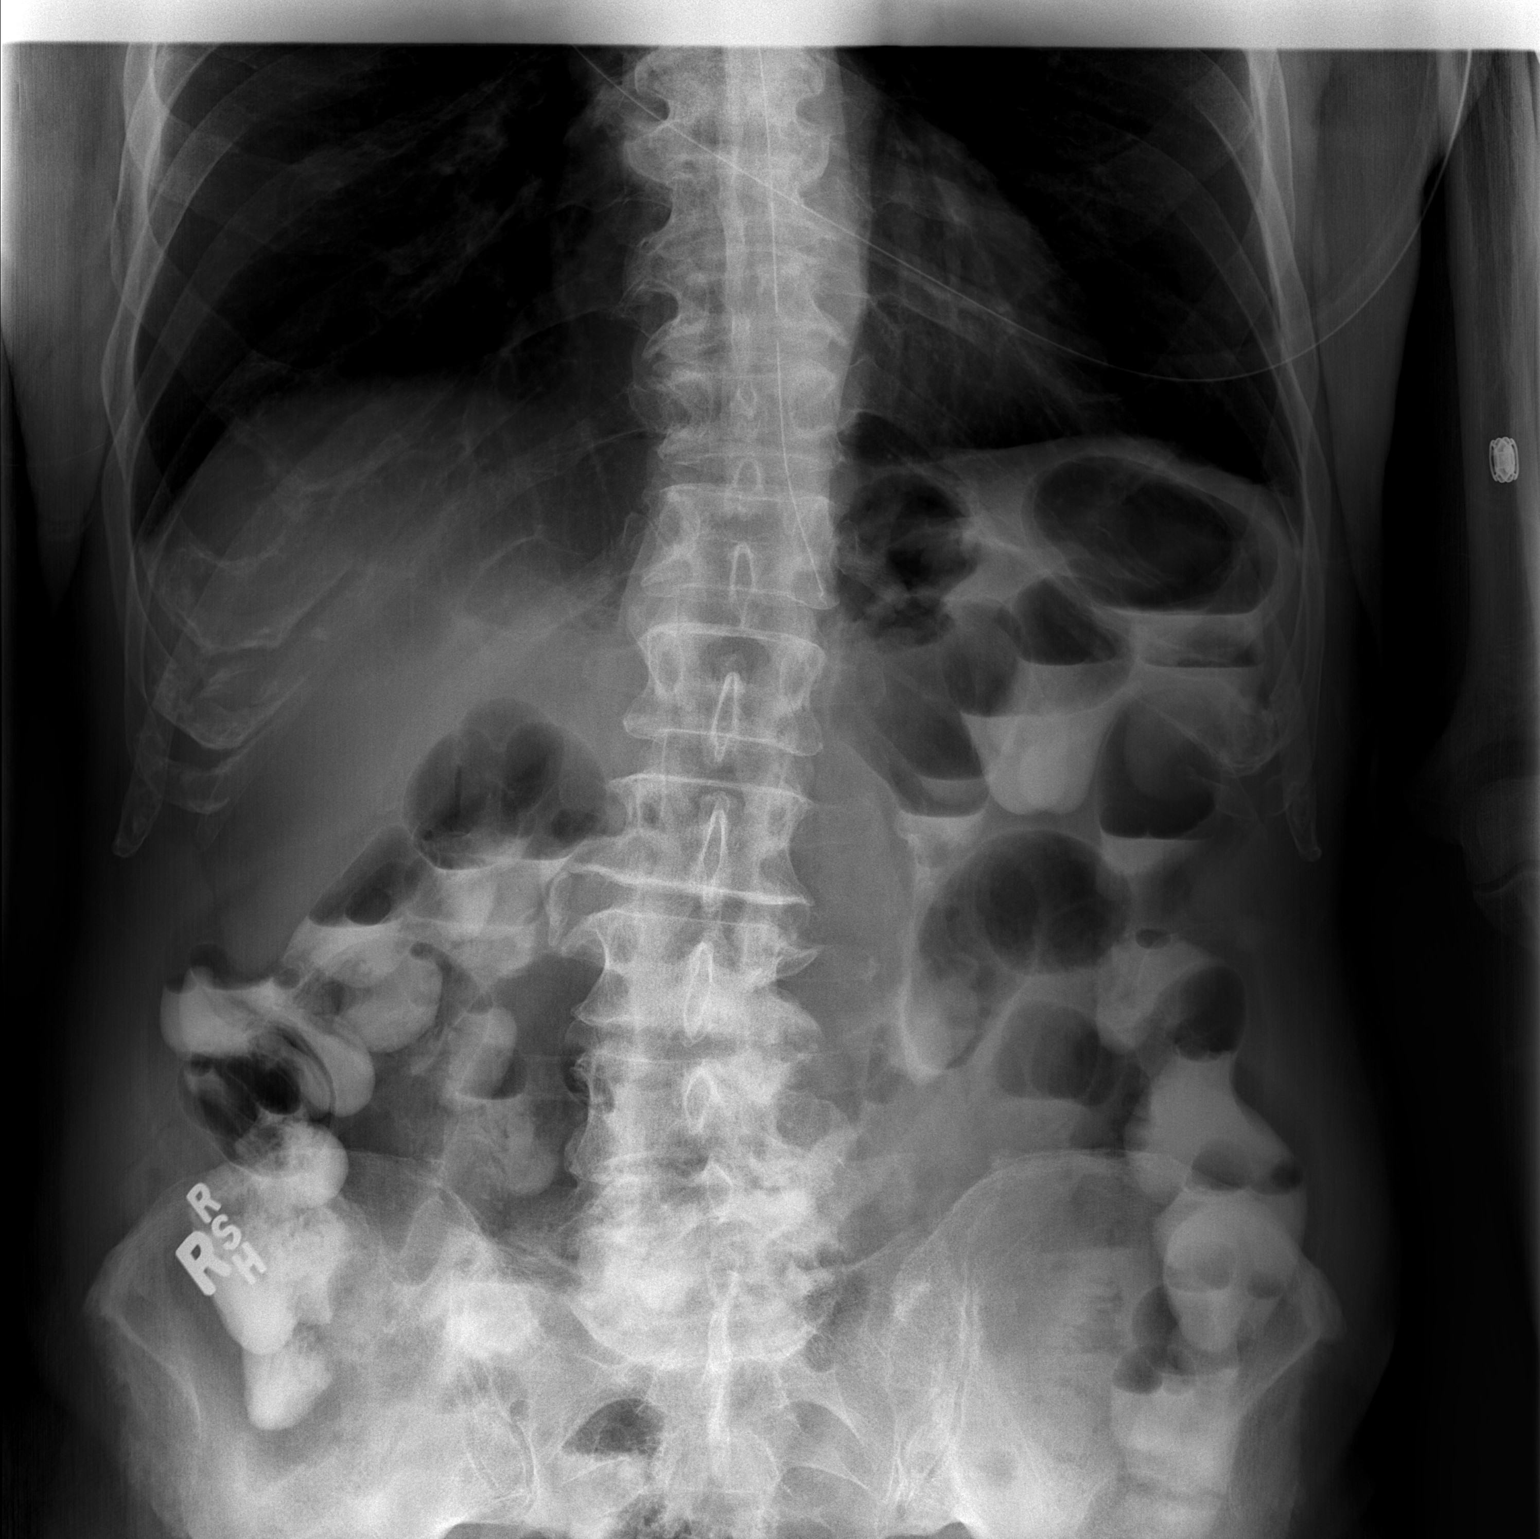

[t abdomen supine]
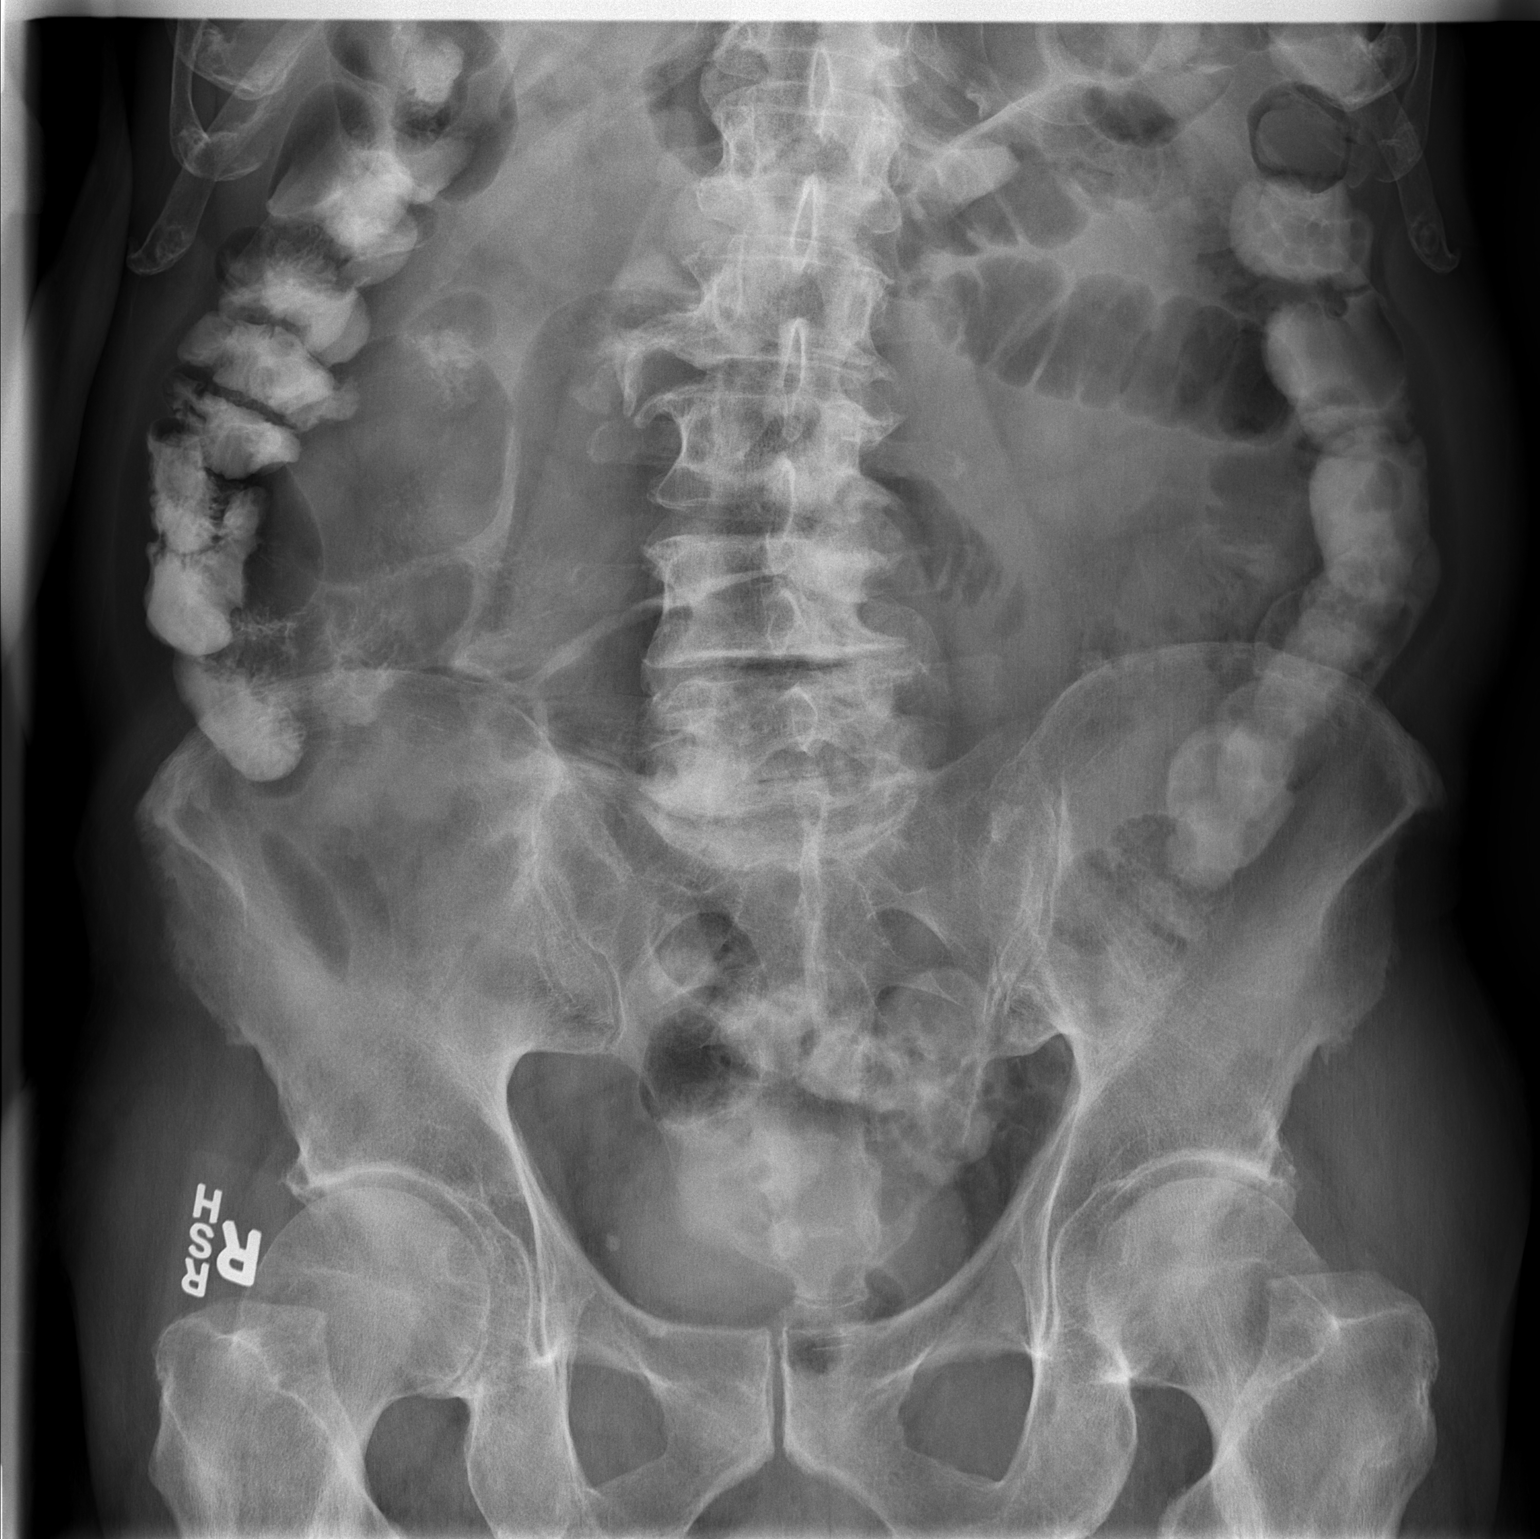

[2 of 2 positions shown; findings below may reference images not displayed]

FINDINGS: The NG tube tip is at the GE junction and needs to be
advanced several centimeters.  There are persistent mildly dilated
air filled loops of small bowel but significant progression of
contrast into the colon.  Findings suggest a partial small bowel
obstruction.  No free air.
IMPRESSION: 1.  The NG tube tip is near the GE junction and can be advanced
several centimeters.
2.  Persistent partial small bowel obstruction bowel gas pattern.
Progression of contrast into the colon is noted.

## 2012-07-12 ENCOUNTER — Other Ambulatory Visit (INDEPENDENT_AMBULATORY_CARE_PROVIDER_SITE_OTHER): Payer: Medicare Other | Admitting: *Deleted

## 2012-07-12 DIAGNOSIS — I6529 Occlusion and stenosis of unspecified carotid artery: Secondary | ICD-10-CM

## 2012-07-18 ENCOUNTER — Other Ambulatory Visit: Payer: Self-pay | Admitting: *Deleted

## 2012-07-18 DIAGNOSIS — I6529 Occlusion and stenosis of unspecified carotid artery: Secondary | ICD-10-CM

## 2012-07-20 ENCOUNTER — Encounter: Payer: Self-pay | Admitting: Vascular Surgery

## 2013-05-27 ENCOUNTER — Ambulatory Visit: Payer: Self-pay

## 2013-06-04 ENCOUNTER — Telehealth: Payer: Self-pay | Admitting: Gastroenterology

## 2013-06-04 NOTE — Telephone Encounter (Signed)
Line rings busy  

## 2013-06-04 NOTE — Telephone Encounter (Signed)
No answer and no voice mail.

## 2013-06-05 NOTE — Telephone Encounter (Signed)
Several calls to the pt with no luck line rings busy or just rings and no voice mail.. I will wait for further communication from the pt

## 2013-06-11 ENCOUNTER — Emergency Department: Payer: Self-pay | Admitting: Emergency Medicine

## 2013-06-11 LAB — URINALYSIS, COMPLETE
Bacteria: NONE SEEN
Bilirubin,UR: NEGATIVE
Ketone: NEGATIVE
Leukocyte Esterase: NEGATIVE
Nitrite: NEGATIVE
Protein: NEGATIVE
RBC,UR: 1 /HPF (ref 0–5)
Specific Gravity: 1.006 (ref 1.003–1.030)
WBC UR: 1 /HPF (ref 0–5)

## 2013-06-11 LAB — COMPREHENSIVE METABOLIC PANEL
Albumin: 4.1 g/dL (ref 3.4–5.0)
Alkaline Phosphatase: 94 U/L (ref 50–136)
Anion Gap: 4 — ABNORMAL LOW (ref 7–16)
BUN: 11 mg/dL (ref 7–18)
Bilirubin,Total: 0.5 mg/dL (ref 0.2–1.0)
Co2: 27 mmol/L (ref 21–32)
Creatinine: 1.24 mg/dL (ref 0.60–1.30)
EGFR (African American): >60
EGFR (Non-African Amer.): 52 — ABNORMAL LOW
Osmolality: 267 (ref 275–301)
SGOT(AST): 26 U/L (ref 15–37)
SGPT (ALT): 30 U/L (ref 12–78)
Total Protein: 7.2 g/dL (ref 6.4–8.2)

## 2013-06-11 LAB — CBC
HCT: 39.5 % — ABNORMAL LOW (ref 40.0–52.0)
HGB: 13.4 g/dL (ref 13.0–18.0)
MCH: 31.8 pg (ref 26.0–34.0)
MCV: 94 fL (ref 80–100)
Platelet: 212 10*3/uL (ref 150–440)
RDW: 12 % (ref 11.5–14.5)
WBC: 7.5 10*3/uL (ref 3.8–10.6)

## 2013-07-04 ENCOUNTER — Encounter: Payer: Self-pay | Admitting: Gastroenterology

## 2013-07-11 ENCOUNTER — Other Ambulatory Visit: Payer: Medicare Other

## 2013-07-11 ENCOUNTER — Ambulatory Visit: Payer: Medicare Other | Admitting: Family

## 2013-07-17 ENCOUNTER — Encounter: Payer: Self-pay | Admitting: Family

## 2013-07-18 ENCOUNTER — Encounter (INDEPENDENT_AMBULATORY_CARE_PROVIDER_SITE_OTHER): Payer: Self-pay

## 2013-07-18 ENCOUNTER — Ambulatory Visit (INDEPENDENT_AMBULATORY_CARE_PROVIDER_SITE_OTHER): Payer: Medicare Other | Admitting: Family

## 2013-07-18 ENCOUNTER — Ambulatory Visit (HOSPITAL_COMMUNITY)
Admission: RE | Admit: 2013-07-18 | Discharge: 2013-07-18 | Disposition: A | Discharge status: Home / Self Care | Payer: Medicare Other | Source: Ambulatory Visit | Attending: Vascular Surgery | Admitting: Vascular Surgery

## 2013-07-18 ENCOUNTER — Encounter: Payer: Self-pay | Admitting: Family

## 2013-07-18 DIAGNOSIS — M79609 Pain in unspecified limb: Secondary | ICD-10-CM | POA: Insufficient documentation

## 2013-07-18 DIAGNOSIS — I6529 Occlusion and stenosis of unspecified carotid artery: Secondary | ICD-10-CM

## 2013-07-18 NOTE — Progress Notes (Signed)
Established Carotid Patient  Previous Carotid surgery: No  History of Present Illness  Gregory Padilla is a 79 y.o. male patient that Dr. Hart Rochester has been following, returns today for followup regarding his carotid occlusive disease. He suffered a small left brain stroke in 2012 following a TURP by Dr. Patsi Sears, he also had a remote history of a right brain stroke 8 years ago with some mild left-sided weakness. He had evidence of mild to moderate left internal carotid stenosis. He reports that he has Meniere's Disease, and has hearing loss from this, wears bilateral hearing aids, but states his balance is mildly off due to many small strokes. He states left side weakness with stroke 7-8 years ago which has resolved, reports left side facial drooping at that time which resolved with physical therapy. The patient denies amaurosis fugax or monocular blindness.    Pt. denies hemiplegia.  The patient denies receptive or expressive aphasia.  Pt. denies extremity weakness.  The patient's previous neurologic status is Unchanged.   Patient reports New Medical or Surgical History: right LQ and RUQ  pain that is under evaluation for the last 3 months, will have colonoscopy later this month. He feels like it is gas, is sometimes alleviated by eating and sometimes aggravated by eating. Is worse in the morning and at night. Has lost 3 pounds in the last 3 months.  Pt Diabetic: Yes Pt smoker: non-smoker  Pt meds include: Statin : Yes ASA: Yes Other anticoagulants/antiplatelets: Plavix   Past Medical History  Diagnosis Date  . Diabetes mellitus age 62    no insulin  . Stroke   . Arthritis   . Carotid artery occlusion   . CVA (cerebral vascular accident)     left sided weakness  . Hyperlipidemia   . Hypertension   . Meniere disease   . Degenerative joint disease   . BPH (benign prostatic hyperplasia)   . Skin cancer     history of skin cancer    Social History History  Substance Use  Topics  . Smoking status: Never Smoker   . Smokeless tobacco: Never Used  . Alcohol Use: No    Family History Family History  Problem Relation Age of Onset  . Diabetes Mother   . Arthritis Mother   . Coronary artery disease Father   . Heart disease Father   . Stroke Neg Hx     Surgical History Past Surgical History  Procedure Laterality Date  . Cholecystectomy    . Prostate surgery      x2  . Back surgery  2004  . Total knee arthroplasty      Allergies  Allergen Reactions  . Amitriptyline     unknown    Current Outpatient Prescriptions  Medication Sig Dispense Refill  . acetaminophen (TYLENOL) 500 MG tablet Take 500 mg by mouth every 6 (six) hours as needed. For fever        . ALPRAZolam (XANAX) 0.25 MG tablet Take 0.25 mg by mouth at bedtime as needed. For anxiety      . Ascorbic Acid (VITAMIN C PO) Take 1,000 mg by mouth daily.       Marland Kitchen aspirin 81 MG tablet Take 81 mg by mouth daily.        . calcium citrate-vitamin D 200-200 MG-UNIT TABS Take 1 tablet by mouth 2 (two) times daily.        . clopidogrel (PLAVIX) 75 MG tablet Take 75 mg by mouth daily.        Marland Kitchen  dicyclomine (BENTYL) 10 MG capsule Take 10 mg by mouth 4 (four) times daily -  before meals and at bedtime. For colon      . diphenhydrAMINE (SOMINEX) 25 MG tablet Take 25 mg by mouth at bedtime as needed. For allergy      . esomeprazole (NEXIUM) 40 MG capsule Take 40 mg by mouth daily before breakfast.        . Polyethyl Glycol-Propyl Glycol (SYSTANE OP) Apply 1 drop to eye daily. 1 drop each eye daily       . rosuvastatin (CRESTOR) 5 MG tablet Take 5 mg by mouth daily.        . diclofenac sodium (VOLTAREN) 1 % GEL Apply 1 application topically 4 (four) times daily.       Bertram Gala Glycol-Propyl Glycol (SYSTANE OP) Apply to eye.         No current facility-administered medications for this visit.    Review of Systems : [x]  Positive   [ ]  Denies  General:[ ]  Weight loss,  [ ]  Weight gain, [ ]  Loss of  appetite, [ ]  Fever, [ ]  chills  Neurologic: [ ]  Dizziness, [ ]  Blackouts, [ ]  Headaches, [ ]  Seizure [ ]  Stroke, [ ]  "Mini stroke", [ ]  Slurred speech, [ ]  Temporary blindness;  [ ] weakness,  Ear/Nose/Throat: [ ]  Change in hearing, [ ]  Nose bleeds, [ ]  Hoarseness  Vascular:[ ]  Pain in legs with walking, [ ]  Pain in feet while lying flat , [ ]   Non-healing ulcer, [ ]  Blood clot in vein,    Pulmonary: [ ]  Home oxygen, [ ]   Productive cough, [ ]  Bronchitis, [ ]  Coughing up blood,  [ ]  Asthma, [ ]  Wheezing  Musculoskeletal:  [ ]  Arthritis, [ ]  Joint pain, [ ]  low back pain  Cardiac: [ ]  Chest pain, [ ]  Shortness of breath when lying flat, [ ]  Shortness of breath with exertion, [ ]  Palpitations, [ ]  Heart murmur, [ ]   Atrial fibrillation  Hematologic:[ ]  Easy Bruising, [ ]  Anemia; [ ]  Hepatitis  Psychiatric: [ ]   Depression, [ ]  Anxiety   Gastrointestinal: [ ]  Black stool, [ ]  Blood in stool, [ ]  Peptic ulcer disease,  [ ]  Gastroesophageal Reflux, [ ]  Trouble swallowing, [ ]  Diarrhea, [ ]  Constipation  Urinary: [ ]  chronic Kidney disease, [ ]  on HD, [ ]  Burning with urination, [ ]  Frequent urination, [ ]  Difficulty urinating;   Skin: [ ]  Rashes, [ ]  Wounds    Physical Examination  Filed Vitals:   07/18/13 1400  BP: 125/71  Pulse: 54  Resp:    Filed Weights   07/18/13 1357  Weight: 174 lb (78.926 kg)   Body mass index is 24.61 kg/(m^2).  General: WDWN male in NAD GAIT: normal Eyes: PERRLA Pulmonary:  CTAB, Negative  Rales, Negative rhonchi, & Negative wheezing.  Cardiac: regular Rhythm, positive murmur.  VASCULAR EXAM Carotid Bruits Left Right   Transmitted cardiac murmer Transmitted cardiac murmer    Aorta is not palpable. Radial pulses are 2+ palpable and equal.  LE Pulses LEFT RIGHT       POPLITEAL  not  palpable   not palpable       POSTERIOR  TIBIAL   palpable    palpable        DORSALIS PEDIS      ANTERIOR TIBIAL  palpable  palpable     Gastrointestinal:mild  tenderness to palpation in RUQ and RLQ ,  negative masses palpated.  Musculoskeletal: Negative muscle atrophy/wasting. M/S 5/5 throughout, Extremities without ischemic changes.  Neurologic: A&O X 3; Appropriate Affect ; SENSATION ;normal;  Speech is normal CN 2-12 intact except hard of hearing, Pain and light touch intact in extremities, Motor exam as listed above.   Non-Invasive Vascular Imaging CAROTID DUPLEX 07/18/2013   Bilateral ICA's with <40% stenosis. These findings are Unchanged from previous exam on 07/12/12.  09/15/11 MRA of head: 1. Mild atherosclerotic irregularity of the supraclinoid internal  carotid arteries bilaterally with approximately 50% stenosis on the  left.  2. Mild moderate small vessel disease. No significant proximal  stenosis, aneurysm, or branch vessel occlusion is evident.  MRI of head 09/15/11:  1. No acute intracranial abnormality or significant interval  change.  2. The stable remote infarcts of the left parietal lobe and  cerebellum.  3. Stable periventricular subcortical white matter changes  bilaterally, likely reflecting sequelae of chronic microvascular  ischemia.   Assessment: Gregory Padilla is a 79 y.o. male who presents with asymptomatic minimal bilateral carotid stenosis which is  Unchanged from previous exam, having no history of carotid intervention. Brachial pressures are equal. MRI of the head from 2012 shows remote infarcts of  the left parietal lobe and cerebellum and also stable periventricular subcortical white matter changes  bilaterally, likely reflecting sequelae of chronic microvascular ischemia.   Plan: Follow-up in 1 year with Carotid Duplex scan.  I discussed in depth with the patient the nature of atherosclerosis, and emphasized the importance of maximal medical management including strict  control of blood pressure, blood glucose, and lipid levels, obtaining regular exercise, and continued cessation of smoking.  The patient is aware that without maximal medical management the underlying atherosclerotic disease process will progress, limiting the benefit of any interventions. The patient was given information about stroke prevention and what symptoms should prompt the patient to seek immediate medical care. Thank you for allowing [place] to participate in this patient's care.  Charisse March, RN, MSN, FNP-C [facility] Office: [phone]  Clinic Physician: Darrick Penna  07/18/2013 1:56 PM

## 2013-07-18 NOTE — Patient Instructions (Signed)
Stroke Prevention Some medical conditions and behaviors are associated with an increased chance of having a stroke. You may prevent a stroke by making healthy choices and managing medical conditions. Reduce your risk of having a stroke by:  Staying physically active. Get at least 30 minutes of activity on most or all days.  Not smoking. It may also be helpful to avoid exposure to secondhand smoke.  Limiting alcohol use. Moderate alcohol use is considered to be:  No more than 2 drinks per day for men.  No more than 1 drink per day for nonpregnant women.  Eating healthy foods.  Include 5 or more servings of fruits and vegetables a day.  Certain diets may be prescribed to address high blood pressure, high cholesterol, diabetes, or obesity.  Managing your cholesterol levels.  A low-saturated fat, low-trans fat, low-cholesterol, and high-fiber diet may control cholesterol levels.  Take any prescribed medicines to control cholesterol as directed by your caregiver.  Managing your diabetes.  A controlled-carbohydrate, controlled-sugar diet is recommended to manage diabetes.  Take any prescribed medicines to control diabetes as directed by your caregiver.  Controlling your high blood pressure (hypertension).  A low-salt (sodium), low-saturated fat, low-trans fat, and low-cholesterol diet is recommended to manage high blood pressure.  Take any prescribed medicines to control hypertension as directed by your caregiver.  Maintaining a healthy weight.  A reduced-calorie, low-sodium, low-saturated fat, low-trans fat, low-cholesterol diet is recommended to manage weight.  Stopping drug abuse.  Avoiding birth control pills.  Talk to your caregiver about the risks of taking birth control pills if you are over 35 years old, smoke, get migraines, or have ever had a blood clot.  Getting evaluated for sleep disorders (sleep apnea).  Talk to your caregiver about getting a sleep evaluation  if you snore a lot or have excessive sleepiness.  Taking medicines as directed by your caregiver.  For some people, aspirin or blood thinners (anticoagulants) are helpful in reducing the risk of forming abnormal blood clots that can lead to stroke. If you have the irregular heart rhythm of atrial fibrillation, you should be on a blood thinner unless there is a good reason you cannot take them.  Understand all your medicine instructions. SEEK IMMEDIATE MEDICAL CARE IF:   You have sudden weakness or numbness of the face, arm, or leg, especially on one side of the body.  You have sudden confusion.  You have trouble speaking (aphasia) or understanding.  You have sudden trouble seeing in one or both eyes.  You have sudden trouble walking.  You have dizziness.  You have a loss of balance or coordination.  You have a sudden, severe headache with no known cause.  You have new chest pain or an irregular heartbeat. Any of these symptoms may represent a serious problem that is an emergency. Do not wait to see if the symptoms will go away. Get medical help right away. Call your local emergency services (911 in [place] Do not drive yourself to the hospital. Document Released: 11/03/2004 Document Revised: 12/19/2011 Document Reviewed: 05/16/2011 [employer] Patient Information 2014 [employer].  

## 2013-07-19 NOTE — Addendum Note (Signed)
Addended by: Sharee Pimple on: 07/19/2013 08:56 AM   Modules accepted: Orders

## 2013-07-23 ENCOUNTER — Other Ambulatory Visit: Payer: Medicare Other

## 2013-07-23 ENCOUNTER — Ambulatory Visit: Payer: Medicare Other | Admitting: Neurosurgery

## 2013-08-07 ENCOUNTER — Encounter: Payer: Self-pay | Admitting: Gastroenterology

## 2013-08-07 ENCOUNTER — Ambulatory Visit (INDEPENDENT_AMBULATORY_CARE_PROVIDER_SITE_OTHER): Payer: Medicare Other | Admitting: Gastroenterology

## 2013-08-07 VITALS — BP 100/62 | HR 72 | Ht 68.7 in | Wt 175.1 lb

## 2013-08-07 DIAGNOSIS — R1031 Right lower quadrant pain: Secondary | ICD-10-CM

## 2013-08-07 DIAGNOSIS — K5900 Constipation, unspecified: Secondary | ICD-10-CM

## 2013-08-07 MED ORDER — HYOSCYAMINE SULFATE 0.125 MG SL SUBL: 0.1250 mg | SUBLINGUAL_TABLET | SUBLINGUAL | Status: DC | PRN

## 2013-08-07 NOTE — Progress Notes (Signed)
Review of pertinent gastrointestinal problems: 1. Colonoscopy Christella Hartigan 10/2009 done for constipation, personal history of adenoma; found diverticulosis, no polyps.  Recommended he continue his usual bowel regimine for constipation.   HPI: This is a    very pleasant 79 year old man who is here with his wife today. I last saw over 3 years ago.  [place] Aug 2014 was essentially normal KUB Aug 2014 showed "moderate to large amount of stool", non-obstructive gas pattern.  HOH.  He has DM, diet controlled.  He has been having pain in RLQ for a long time, for 5 months. Has been to ER.  He believes he had CT scan in [place], I don't have those results.  DO have [place] results.   Passing a lot of gas.    Has had chronic constipation.  Drinks metamucil, prune juice, miralax.  On this he will have BM, liquid, every day.    The right lower quadrant pain comes and goes, it is positional so that if he lays on his right side the pain is improved. He tells me the pain usually does improve after he has a good bowel movement.  He has not been losing weight  Review of systems: Pertinent positive and negative review of systems were noted in the above HPI section. Complete review of systems was performed and was otherwise normal.    Past Medical History  Diagnosis Date  . Diabetes mellitus age 54    no insulin  . Stroke   . Arthritis   . Carotid artery occlusion   . CVA (cerebral vascular accident)     left sided weakness  . Hyperlipidemia   . Hypertension   . Meniere disease   . Degenerative joint disease   . BPH (benign prostatic hyperplasia)   . Skin cancer     history of skin cancer    Past Surgical History  Procedure Laterality Date  . Cholecystectomy    . Prostate surgery      x2  . Back surgery  2004  . Total knee arthroplasty      Current Outpatient Prescriptions  Medication Sig Dispense Refill  . acetaminophen (TYLENOL) 500 MG tablet Take 500 mg by mouth every 6 (six) hours as  needed. For fever        . ALPRAZolam (XANAX) 0.25 MG tablet Take 0.25 mg by mouth at bedtime as needed. For anxiety      . Ascorbic Acid (VITAMIN C PO) Take 1,000 mg by mouth daily.       Marland Kitchen aspirin 81 MG tablet Take 81 mg by mouth daily.        . calcium citrate-vitamin D 200-200 MG-UNIT TABS Take 1 tablet by mouth 2 (two) times daily.        . clopidogrel (PLAVIX) 75 MG tablet Take 75 mg by mouth daily.        Marland Kitchen dicyclomine (BENTYL) 10 MG capsule Take 10 mg by mouth 4 (four) times daily -  before meals and at bedtime. For colon      . diphenhydrAMINE (SOMINEX) 25 MG tablet Take 25 mg by mouth at bedtime as needed. For allergy      . esomeprazole (NEXIUM) 40 MG capsule Take 40 mg by mouth daily before breakfast.        . Polyethyl Glycol-Propyl Glycol (SYSTANE OP) Apply to eye.        . polyethylene glycol powder (MIRALAX) powder Take 17 g by mouth daily.      . psyllium (METAMUCIL)  58.6 % powder Take 1 packet by mouth 3 (three) times daily.      . rosuvastatin (CRESTOR) 5 MG tablet Take 5 mg by mouth daily.         No current facility-administered medications for this visit.    Allergies as of 08/07/2013 - Review Complete 08/07/2013  Allergen Reaction Noted  . Amitriptyline  06/02/2011    Family History  Problem Relation Age of Onset  . Diabetes Mother   . Arthritis Mother   . Coronary artery disease Father   . Heart disease Father   . Stroke Neg Hx     History   Social History  . Marital Status: Married    Spouse Name: N/A    Number of Children: 2  . Years of Education: N/A   Occupational History  . retired    Social History Main Topics  . Smoking status: Never Smoker   . Smokeless tobacco: Never Used  . Alcohol Use: No  . Drug Use: No  . Sexual Activity: Not on file   Other Topics Concern  . Not on file   Social History Narrative  . No narrative on file       Physical Exam: BP 100/62  Pulse 72  Ht 5' 8.7" (1.745 m)  Wt 175 lb 2 oz (79.436 kg)  BMI  26.09 kg/m2 Constitutional: generally well-appearing Psychiatric: alert and oriented x3 Eyes: extraocular movements intact Mouth: oral pharynx moist, no lesions Neck: supple no lymphadenopathy Cardiovascular: heart regular rate and rhythm Lungs: clear to auscultation bilaterally Abdomen: soft, nontender, nondistended, no obvious ascites, no peritoneal signs, normal bowel sounds Extremities: no lower extremity edema bilaterally Skin: no lesions on visible extremities    Assessment and plan: 79 y.o. male with  right lower quadrant pain that is probably related to symptomatic constipation  He has had constipation all his life. The pain is certainly new for him in the past 4-5 months. I did colonoscopy for him 3/2 years ago I do not think that needs to be repeated now he is not sure if he had a CAT scan to evaluate this pain recently. I do have reports from ultrasound as well as KUB. We will check for his referring facility to see if he has had a CAT scan there. If he did I would like to review the report. If he has not I think that that is a reasonable next step to evaluate this pain. In the meantime I'm going to try to get his bowels moving a little bit better with a trial of linzess, Low strength one pill once daily. He will also try sublingual antispasm medicine pain starts. He will at least call our office reported symptoms of 4-5 weeks and sooner if needed.

## 2013-08-07 NOTE — Patient Instructions (Signed)
We will contact [facility] to get your recent CT scan report. If you have not had a CT scan in past 4-5 months, then we will set one up (CT scan of abdomen and pelvis with IV and oral contrast). Trial of linzess samples (one pill one once daily) to help with your constipation. Sublingual antispasm medicine called into your pharmacy, take one pill 2-3 times per day as needed. Call Dr. Christella Hartigan in 4 weeks to report on your symptoms.

## 2013-08-09 ENCOUNTER — Telehealth: Payer: Self-pay

## 2013-08-09 NOTE — Telephone Encounter (Signed)
Release faxed

## 2013-08-09 NOTE — Telephone Encounter (Signed)
Need recent CT from [place] [phone] Fax

## 2013-08-12 ENCOUNTER — Telehealth: Payer: Self-pay | Admitting: Gastroenterology

## 2013-08-12 NOTE — Telephone Encounter (Signed)
Pt says that the pain has gotten some worse and the linzess is working but he is only having small amounts of watery stool.  Please advise

## 2013-08-12 NOTE — Telephone Encounter (Signed)
CT scan 06/2013 with IV contrast but no oral contrast:  Abnormal urinary bladder, B inguinal hernias, colonic diverticulosis; essentially no clear cause of his RLQ pains.    Patty, please call him.  CT scan last month, does not need to be repeated.  Should continue with the suggestions outlined at recent visit.

## 2013-08-13 NOTE — Telephone Encounter (Signed)
No answer and no voice mail.

## 2013-08-13 NOTE — Telephone Encounter (Signed)
Needs to stay on the linzess once daily.  Also I want to change to pill form of antispasm med instead of sublingual.  Please call him in levbid 0.375mg  pill, take one pill twice daily.  Disp 1 month, 4 refills.  HE needs rov in 3-4 weeks.

## 2013-08-14 NOTE — Telephone Encounter (Signed)
Voice mail is full, I will mail letter for pt to call

## 2013-08-19 ENCOUNTER — Telehealth: Payer: Self-pay | Admitting: Gastroenterology

## 2013-08-21 MED ORDER — HYOSCYAMINE SULFATE ER 0.375 MG PO TB12: 0.3750 mg | ORAL_TABLET | Freq: Two times a day (BID) | ORAL | Status: DC

## 2013-08-21 NOTE — Telephone Encounter (Signed)
Pt has been given recommendations and ROV has been scheduled

## 2013-09-02 ENCOUNTER — Telehealth: Payer: Self-pay | Admitting: Gastroenterology

## 2013-09-02 NOTE — Telephone Encounter (Signed)
Pt has been rescheduled to 09/03/13 to discuss worsening pain abd pain

## 2013-09-03 ENCOUNTER — Other Ambulatory Visit (INDEPENDENT_AMBULATORY_CARE_PROVIDER_SITE_OTHER): Payer: Medicare Other

## 2013-09-03 ENCOUNTER — Ambulatory Visit (INDEPENDENT_AMBULATORY_CARE_PROVIDER_SITE_OTHER): Payer: Medicare Other | Admitting: Gastroenterology

## 2013-09-03 ENCOUNTER — Encounter: Payer: Self-pay | Admitting: Gastroenterology

## 2013-09-03 VITALS — BP 128/62 | HR 72 | Ht 68.75 in | Wt 176.2 lb

## 2013-09-03 DIAGNOSIS — R1031 Right lower quadrant pain: Secondary | ICD-10-CM

## 2013-09-03 DIAGNOSIS — K5900 Constipation, unspecified: Secondary | ICD-10-CM

## 2013-09-03 LAB — COMPREHENSIVE METABOLIC PANEL
ALT: 24 U/L (ref 0–53)
AST: 26 U/L (ref 0–37)
Alkaline Phosphatase: 69 U/L (ref 39–117)
CO2: 28 mEq/L (ref 19–32)
GFR: 65.31 mL/min (ref >60.00)
Sodium: 134 mEq/L — ABNORMAL LOW (ref 135–145)
Total Bilirubin: 0.4 mg/dL (ref 0.3–1.2)
Total Protein: 7.5 g/dL (ref 6.0–8.3)

## 2013-09-03 LAB — CBC WITH DIFFERENTIAL/PLATELET
Basophils Absolute: 0 10*3/uL (ref 0.0–0.1)
Basophils Relative: 0.3 % (ref 0.0–3.0)
Eosinophils Absolute: 0.2 10*3/uL (ref 0.0–0.7)
HCT: 39.3 % (ref 39.0–52.0)
Hemoglobin: 13.4 g/dL (ref 13.0–17.0)
Lymphocytes Relative: 17.6 % (ref 12.0–46.0)
Lymphs Abs: 2.1 10*3/uL (ref 0.7–4.0)
Monocytes Absolute: 1.2 10*3/uL — ABNORMAL HIGH (ref 0.1–1.0)
Monocytes Relative: 10.4 % (ref 3.0–12.0)
Platelets: 235 10*3/uL (ref 150.0–400.0)
RDW: 12.6 % (ref 11.5–14.6)

## 2013-09-03 MED ORDER — LINACLOTIDE 145 MCG PO CAPS: 145.0000 ug | ORAL_CAPSULE | Freq: Every day | ORAL | Status: DC

## 2013-09-03 NOTE — Patient Instructions (Signed)
You will have labs checked today in the basement lab.  Please head down after you check out with the front desk  (cbc, cmet). Trial of linzess low strength (one pill once daily). Stay on miralax. Stop metamucil. Stop prune juice. Call Dr. Christella Hartigan office in 1 week to report on your symptoms. Continue twice daily dicyclomine.

## 2013-09-03 NOTE — Progress Notes (Signed)
Review of pertinent gastrointestinal problems: 1. Colonoscopy Christella Hartigan 10/2009 done for constipation, personal history of adenoma; found diverticulosis, no polyps.  Recommended he continue his usual bowel regimine for constipation. 2. RLQ pains. Pain is positional, worse laying on right side, improved with BM. Does tend to be constipated.  Outside CT scan 06/2013 with IV contrast but no oral contrast: Abnormal urinary bladder, B inguinal hernias, colonic diverticulosis; essentially no clear cause of his RLQ pains.   HPI: This is a   very pleasant 79 year old man who is here with his wife today.  I last saw him 5-6 weeks ago.    He thinks he is worse.  Last night took miralax and prunes this AM.  He needs very loose bowel or they don't move.  IF he moves his bowels, sometimes the pain gets better.  The pain and constipation are both worse.   Past Medical History  Diagnosis Date  . Diabetes mellitus age 11    no insulin  . Stroke   . Arthritis   . Carotid artery occlusion   . CVA (cerebral vascular accident)     left sided weakness  . Hyperlipidemia   . Hypertension   . Meniere disease   . Degenerative joint disease   . BPH (benign prostatic hyperplasia)   . Skin cancer     history of skin cancer    Past Surgical History  Procedure Laterality Date  . Cholecystectomy    . Prostate surgery      x2  . Back surgery  2004  . Total knee arthroplasty      Current Outpatient Prescriptions  Medication Sig Dispense Refill  . acetaminophen (TYLENOL) 500 MG tablet Take 500 mg by mouth every 6 (six) hours as needed. For fever        . ALPRAZolam (XANAX) 0.25 MG tablet Take 0.25 mg by mouth at bedtime as needed. For anxiety      . Ascorbic Acid (VITAMIN C PO) Take 1,000 mg by mouth daily.       Marland Kitchen aspirin 81 MG tablet Take 81 mg by mouth daily.        . calcium citrate-vitamin D 200-200 MG-UNIT TABS Take 1 tablet by mouth 2 (two) times daily.        . clopidogrel (PLAVIX) 75 MG tablet  Take 75 mg by mouth daily.        . diphenhydrAMINE (SOMINEX) 25 MG tablet Take 25 mg by mouth at bedtime as needed. For allergy      . esomeprazole (NEXIUM) 40 MG capsule Take 40 mg by mouth daily before breakfast.        . hyoscyamine (LEVBID) 0.375 MG 12 hr tablet Take 1 tablet (0.375 mg total) by mouth 2 (two) times daily.  60 tablet  4  . Polyethyl Glycol-Propyl Glycol (SYSTANE OP) Apply to eye.        . polyethylene glycol powder (MIRALAX) powder Take 17 g by mouth daily.      . psyllium (METAMUCIL) 58.6 % powder Take 1 packet by mouth 3 (three) times daily.      . rosuvastatin (CRESTOR) 5 MG tablet Take 5 mg by mouth daily.         No current facility-administered medications for this visit.    Allergies as of 09/03/2013 - Review Complete 09/03/2013  Allergen Reaction Noted  . Amitriptyline  06/02/2011    Family History  Problem Relation Age of Onset  . Diabetes Mother   . Arthritis Mother   .  Coronary artery disease Father   . Heart disease Father   . Stroke Neg Hx     History   Social History  . Marital Status: Married    Spouse Name: N/A    Number of Children: 2  . Years of Education: N/A   Occupational History  . retired    Social History Main Topics  . Smoking status: Never Smoker   . Smokeless tobacco: Never Used  . Alcohol Use: No  . Drug Use: No  . Sexual Activity: Not on file   Other Topics Concern  . Not on file   Social History Narrative  . No narrative on file      Physical Exam: BP 128/62  Pulse 72  Ht 5' 8.75" (1.746 m)  Wt 176 lb 4 oz (79.946 kg)  BMI 26.22 kg/m2 Constitutional: generally well-appearing Psychiatric: alert and oriented x3 Abdomen: soft, nontender, nondistended, no obvious ascites, no peritoneal signs, normal bowel sounds     Assessment and plan: 79 y.o. male with right lower quadrant pains, constipation  outside CAT scan showed no clear etiology of his pains. I'm suspicious that her constipation, gas related.  He does tell me that sometimes when he moves his bowels the pain is improved. It is also a positional pain. He is going to stop prune juice, stop Metamucil, he will continue MiraLax, he will start a trial of Lynn's S. pills and we will get in 2 weeks at this low strength. He will call to report on his response to these therapies in 7 days. He is also going to get a basic set of labs including CBC and complete metabolic profile.*

## 2013-09-09 ENCOUNTER — Other Ambulatory Visit: Payer: Self-pay

## 2013-09-09 DIAGNOSIS — K5900 Constipation, unspecified: Secondary | ICD-10-CM

## 2013-09-09 DIAGNOSIS — R109 Unspecified abdominal pain: Secondary | ICD-10-CM

## 2013-09-12 ENCOUNTER — Telehealth: Payer: Self-pay

## 2013-09-12 NOTE — Telephone Encounter (Signed)
Message copied by Donata Duff on Thu Sep 12, 2013  8:08 AM ------      Message from: LEWIS, [place] Gustafson L      Created: Mon Sep 09, 2013  3:52 PM       Pt to get labs  ------

## 2013-09-12 NOTE — Telephone Encounter (Signed)
Pt has been notified to have labs as requested by Dr Christella Hartigan

## 2013-09-16 ENCOUNTER — Telehealth: Payer: Self-pay | Admitting: Gastroenterology

## 2013-09-17 NOTE — Telephone Encounter (Signed)
Left message on machine to call back  

## 2013-09-18 NOTE — Telephone Encounter (Signed)
Pt aware that the linzess was sent to the pharmacy.  She was also advised that the pt's stools may be loose but if it worsens to cut back on the linzess to every other day and call our office pt agreed

## 2013-09-20 ENCOUNTER — Ambulatory Visit: Payer: Medicare Other | Admitting: Gastroenterology

## 2013-09-27 ENCOUNTER — Telehealth: Payer: Self-pay | Admitting: Gastroenterology

## 2013-09-27 NOTE — Telephone Encounter (Signed)
Pt says that his PCP wants him to have a colon, I advised the pt to have the PCP send a note to Dr Christella Hartigan for review.  Pt agreed and was given fax number

## 2014-03-01 ENCOUNTER — Emergency Department: Payer: Self-pay | Admitting: Emergency Medicine

## 2014-03-01 LAB — BASIC METABOLIC PANEL
Anion Gap: 11 (ref 7–16)
BUN: 9 mg/dL (ref 7–18)
CREATININE: 1.27 mg/dL (ref 0.60–1.30)
Calcium, Total: 9.2 mg/dL (ref 8.5–10.1)
Chloride: 99 mmol/L (ref 98–107)
Co2: 22 mmol/L (ref 21–32)
EGFR (African American): 58 — ABNORMAL LOW
EGFR (Non-African Amer.): 50 — ABNORMAL LOW
Glucose: 187 mg/dL — ABNORMAL HIGH (ref 65–99)
Osmolality: 268 (ref 275–301)
Potassium: 4.3 mmol/L (ref 3.5–5.1)
Sodium: 132 mmol/L — ABNORMAL LOW (ref 136–145)

## 2014-03-01 LAB — CBC
HCT: 40.3 % (ref 40.0–52.0)
HGB: 13.4 g/dL (ref 13.0–18.0)
MCH: 31.4 pg (ref 26.0–34.0)
MCHC: 33.3 g/dL (ref 32.0–36.0)
MCV: 95 fL (ref 80–100)
Platelet: 208 10*3/uL (ref 150–440)
RBC: 4.27 10*6/uL — ABNORMAL LOW (ref 4.40–5.90)
RDW: 12.5 % (ref 11.5–14.5)
WBC: 5.6 10*3/uL (ref 3.8–10.6)

## 2014-03-01 LAB — TROPONIN I
Troponin-I: <0.02 ng/mL
Troponin-I: <0.02 ng/mL

## 2014-03-01 LAB — D-DIMER(ARMC): D-DIMER: 820 ng/mL

## 2014-05-22 ENCOUNTER — Encounter: Payer: Self-pay | Admitting: Gastroenterology

## 2014-07-24 ENCOUNTER — Encounter: Payer: Self-pay | Admitting: Family

## 2014-07-25 ENCOUNTER — Encounter: Payer: Medicare Other | Admitting: Family

## 2014-07-25 ENCOUNTER — Ambulatory Visit (HOSPITAL_COMMUNITY)
Admission: RE | Admit: 2014-07-25 | Discharge: 2014-07-25 | Disposition: A | Discharge status: Home / Self Care | Payer: Medicare Other | Source: Ambulatory Visit | Attending: Family | Admitting: Family

## 2014-07-25 DIAGNOSIS — I6523 Occlusion and stenosis of bilateral carotid arteries: Secondary | ICD-10-CM | POA: Insufficient documentation

## 2014-07-28 NOTE — Patient Instructions (Signed)
Dear Gregory Padilla. Your recent Vascular Lab study  Oct. 16, 2015, indicates: NO significant change in comparison to the last exam on Oct. 9, 2014. Please follow up with [place] in One Year !          Stroke Prevention Some medical conditions and behaviors are associated with an increased chance of having a stroke. You may prevent a stroke by making healthy choices and managing medical conditions. HOW CAN I REDUCE MY RISK OF HAVING A STROKE?   Stay physically active. Get at least 30 minutes of activity on most or all days.  Do not smoke. It may also be helpful to avoid exposure to secondhand smoke.  Limit alcohol use. Moderate alcohol use is considered to be:  No more than 2 drinks per day for men.  No more than 1 drink per day for nonpregnant women.  Eat healthy foods. This involves:  Eating 5 or more servings of fruits and vegetables a day.  Making dietary changes that address high blood pressure (hypertension), high cholesterol, diabetes, or obesity.  Manage your cholesterol levels.  Making food choices that are high in fiber and low in saturated fat, trans fat, and cholesterol may control cholesterol levels.  Take any prescribed medicines to control cholesterol as directed by your health care provider.  Manage your diabetes.  Controlling your carbohydrate and sugar intake is recommended to manage diabetes.  Take any prescribed medicines to control diabetes as directed by your health care provider.  Control your hypertension.  Making food choices that are low in salt (sodium), saturated fat, trans fat, and cholesterol is recommended to manage hypertension.  Take any prescribed medicines to control hypertension as directed by your health care provider.  Maintain a healthy weight.  Reducing calorie intake and making food choices that are low in sodium, saturated fat, trans fat, and cholesterol are recommended to manage weight.  Stop drug abuse.  Avoid taking  birth control pills.  Talk to your health care provider about the risks of taking birth control pills if you are over 74 years old, smoke, get migraines, or have ever had a blood clot.  Get evaluated for sleep disorders (sleep apnea).  Talk to your health care provider about getting a sleep evaluation if you snore a lot or have excessive sleepiness.  Take medicines only as directed by your health care provider.  For some people, aspirin or blood thinners (anticoagulants) are helpful in reducing the risk of forming abnormal blood clots that can lead to stroke. If you have the irregular heart rhythm of atrial fibrillation, you should be on a blood thinner unless there is a good reason you cannot take them.  Understand all your medicine instructions.  Make sure that other conditions (such as anemia or atherosclerosis) are addressed. SEEK IMMEDIATE MEDICAL CARE IF:   You have sudden weakness or numbness of the face, arm, or leg, especially on one side of the body.  Your face or eyelid droops to one side.  You have sudden confusion.  You have trouble speaking (aphasia) or understanding.  You have sudden trouble seeing in one or both eyes.  You have sudden trouble walking.  You have dizziness.  You have a loss of balance or coordination.  You have a sudden, severe headache with no known cause.  You have new chest pain or an irregular heartbeat. Any of these symptoms may represent a serious problem that is an emergency. Do not wait to see if the symptoms will go  away. Get medical help at once. Call your local emergency services (911 in [place] Do not drive yourself to the hospital. Document Released: 11/03/2004 Document Revised: 02/10/2014 Document Reviewed: 03/29/2013 [facility] Patient Information 2015 [place], [place]. This information is not intended to replace advice given to you by your health care provider. Make sure you discuss any questions you have with your health care  provider.

## 2014-07-29 ENCOUNTER — Encounter: Payer: Self-pay | Admitting: Vascular Surgery

## 2014-07-29 NOTE — Progress Notes (Signed)
Lab only 

## 2015-01-26 ENCOUNTER — Inpatient Hospital Stay (HOSPITAL_COMMUNITY)
Admission: EM | Admit: 2015-01-26 | Discharge: 2015-01-29 | DRG: 871 | Disposition: A | Discharge status: Home / Self Care | Payer: Medicare Other | Attending: Internal Medicine | Admitting: Internal Medicine

## 2015-01-26 DIAGNOSIS — A419 Sepsis, unspecified organism: Principal | ICD-10-CM | POA: Diagnosis present

## 2015-01-26 DIAGNOSIS — R531 Weakness: Secondary | ICD-10-CM | POA: Diagnosis not present

## 2015-01-26 DIAGNOSIS — I639 Cerebral infarction, unspecified: Secondary | ICD-10-CM | POA: Diagnosis present

## 2015-01-26 DIAGNOSIS — S45901A Unspecified injury of unspecified blood vessel at shoulder and upper arm level, right arm, initial encounter: Secondary | ICD-10-CM

## 2015-01-26 DIAGNOSIS — K219 Gastro-esophageal reflux disease without esophagitis: Secondary | ICD-10-CM | POA: Diagnosis present

## 2015-01-26 DIAGNOSIS — I10 Essential (primary) hypertension: Secondary | ICD-10-CM | POA: Diagnosis present

## 2015-01-26 DIAGNOSIS — E785 Hyperlipidemia, unspecified: Secondary | ICD-10-CM | POA: Diagnosis present

## 2015-01-26 DIAGNOSIS — J159 Unspecified bacterial pneumonia: Secondary | ICD-10-CM | POA: Diagnosis present

## 2015-01-26 DIAGNOSIS — N400 Enlarged prostate without lower urinary tract symptoms: Secondary | ICD-10-CM | POA: Diagnosis present

## 2015-01-26 DIAGNOSIS — Z85828 Personal history of other malignant neoplasm of skin: Secondary | ICD-10-CM

## 2015-01-26 DIAGNOSIS — R1310 Dysphagia, unspecified: Secondary | ICD-10-CM

## 2015-01-26 DIAGNOSIS — J189 Pneumonia, unspecified organism: Secondary | ICD-10-CM

## 2015-01-26 DIAGNOSIS — M1990 Unspecified osteoarthritis, unspecified site: Secondary | ICD-10-CM | POA: Diagnosis present

## 2015-01-26 DIAGNOSIS — Z7902 Long term (current) use of antithrombotics/antiplatelets: Secondary | ICD-10-CM

## 2015-01-26 DIAGNOSIS — I6529 Occlusion and stenosis of unspecified carotid artery: Secondary | ICD-10-CM | POA: Diagnosis present

## 2015-01-26 DIAGNOSIS — R29810 Facial weakness: Secondary | ICD-10-CM | POA: Diagnosis present

## 2015-01-26 DIAGNOSIS — Z7982 Long term (current) use of aspirin: Secondary | ICD-10-CM

## 2015-01-26 DIAGNOSIS — I69354 Hemiplegia and hemiparesis following cerebral infarction affecting left non-dominant side: Secondary | ICD-10-CM

## 2015-01-26 DIAGNOSIS — C4490 Unspecified malignant neoplasm of skin, unspecified: Secondary | ICD-10-CM

## 2015-01-26 DIAGNOSIS — F419 Anxiety disorder, unspecified: Secondary | ICD-10-CM | POA: Diagnosis present

## 2015-01-26 DIAGNOSIS — E119 Type 2 diabetes mellitus without complications: Secondary | ICD-10-CM | POA: Diagnosis present

## 2015-01-26 HISTORY — DX: Coccidioidomycosis, unspecified: B38.9

## 2015-01-27 ENCOUNTER — Emergency Department (HOSPITAL_COMMUNITY): Payer: Medicare Other

## 2015-01-27 ENCOUNTER — Encounter (HOSPITAL_COMMUNITY): Payer: Self-pay | Admitting: Emergency Medicine

## 2015-01-27 ENCOUNTER — Inpatient Hospital Stay (HOSPITAL_COMMUNITY): Payer: Medicare Other

## 2015-01-27 DIAGNOSIS — M1990 Unspecified osteoarthritis, unspecified site: Secondary | ICD-10-CM | POA: Diagnosis present

## 2015-01-27 DIAGNOSIS — Z85828 Personal history of other malignant neoplasm of skin: Secondary | ICD-10-CM | POA: Diagnosis not present

## 2015-01-27 DIAGNOSIS — E785 Hyperlipidemia, unspecified: Secondary | ICD-10-CM | POA: Diagnosis present

## 2015-01-27 DIAGNOSIS — R531 Weakness: Secondary | ICD-10-CM | POA: Diagnosis present

## 2015-01-27 DIAGNOSIS — A419 Sepsis, unspecified organism: Secondary | ICD-10-CM | POA: Diagnosis present

## 2015-01-27 DIAGNOSIS — R1310 Dysphagia, unspecified: Secondary | ICD-10-CM | POA: Diagnosis present

## 2015-01-27 DIAGNOSIS — I6529 Occlusion and stenosis of unspecified carotid artery: Secondary | ICD-10-CM | POA: Diagnosis present

## 2015-01-27 DIAGNOSIS — Z7902 Long term (current) use of antithrombotics/antiplatelets: Secondary | ICD-10-CM | POA: Diagnosis not present

## 2015-01-27 DIAGNOSIS — N400 Enlarged prostate without lower urinary tract symptoms: Secondary | ICD-10-CM | POA: Diagnosis present

## 2015-01-27 DIAGNOSIS — J189 Pneumonia, unspecified organism: Secondary | ICD-10-CM

## 2015-01-27 DIAGNOSIS — R29810 Facial weakness: Secondary | ICD-10-CM | POA: Diagnosis present

## 2015-01-27 DIAGNOSIS — F419 Anxiety disorder, unspecified: Secondary | ICD-10-CM | POA: Diagnosis present

## 2015-01-27 DIAGNOSIS — I10 Essential (primary) hypertension: Secondary | ICD-10-CM | POA: Diagnosis present

## 2015-01-27 DIAGNOSIS — I639 Cerebral infarction, unspecified: Secondary | ICD-10-CM | POA: Diagnosis present

## 2015-01-27 DIAGNOSIS — E119 Type 2 diabetes mellitus without complications: Secondary | ICD-10-CM

## 2015-01-27 DIAGNOSIS — Z7982 Long term (current) use of aspirin: Secondary | ICD-10-CM | POA: Diagnosis not present

## 2015-01-27 DIAGNOSIS — I69354 Hemiplegia and hemiparesis following cerebral infarction affecting left non-dominant side: Secondary | ICD-10-CM | POA: Diagnosis not present

## 2015-01-27 DIAGNOSIS — J159 Unspecified bacterial pneumonia: Secondary | ICD-10-CM | POA: Diagnosis present

## 2015-01-27 DIAGNOSIS — I6789 Other cerebrovascular disease: Secondary | ICD-10-CM | POA: Diagnosis not present

## 2015-01-27 DIAGNOSIS — K219 Gastro-esophageal reflux disease without esophagitis: Secondary | ICD-10-CM | POA: Diagnosis present

## 2015-01-27 DIAGNOSIS — C4490 Unspecified malignant neoplasm of skin, unspecified: Secondary | ICD-10-CM | POA: Insufficient documentation

## 2015-01-27 LAB — LACTIC ACID, PLASMA
LACTIC ACID, VENOUS: 1 mmol/L (ref 0.5–2.0)
Lactic Acid, Venous: 1.4 mmol/L (ref 0.5–2.0)

## 2015-01-27 LAB — COMPREHENSIVE METABOLIC PANEL
ALT: 22 U/L (ref 0–53)
AST: 24 U/L (ref 0–37)
Albumin: 4.3 g/dL (ref 3.5–5.2)
Alkaline Phosphatase: 71 U/L (ref 39–117)
Anion gap: 10 (ref 5–15)
BILIRUBIN TOTAL: 0.8 mg/dL (ref 0.3–1.2)
BUN: 7 mg/dL (ref 6–23)
CHLORIDE: 98 mmol/L (ref 96–112)
CO2: 25 mmol/L (ref 19–32)
Calcium: 9 mg/dL (ref 8.4–10.5)
Creatinine, Ser: 1.2 mg/dL (ref 0.50–1.35)
GFR calc Af Amer: 61 mL/min — ABNORMAL LOW (ref >90)
GFR, EST NON AFRICAN AMERICAN: 53 mL/min — AB (ref >90)
Glucose, Bld: 166 mg/dL — ABNORMAL HIGH (ref 70–99)
POTASSIUM: 3.6 mmol/L (ref 3.5–5.1)
SODIUM: 133 mmol/L — AB (ref 135–145)
Total Protein: 6.4 g/dL (ref 6.0–8.3)

## 2015-01-27 LAB — URINALYSIS, ROUTINE W REFLEX MICROSCOPIC
BILIRUBIN URINE: NEGATIVE
Glucose, UA: NEGATIVE mg/dL
HGB URINE DIPSTICK: NEGATIVE
KETONES UR: NEGATIVE mg/dL
Leukocytes, UA: NEGATIVE
NITRITE: NEGATIVE
PROTEIN: NEGATIVE mg/dL
SPECIFIC GRAVITY, URINE: 1.021 (ref 1.005–1.030)
UROBILINOGEN UA: 1 mg/dL (ref 0.0–1.0)
pH: 6 (ref 5.0–8.0)

## 2015-01-27 LAB — EXPECTORATED SPUTUM ASSESSMENT W REFEX TO RESP CULTURE

## 2015-01-27 LAB — TROPONIN I

## 2015-01-27 LAB — CBC WITH DIFFERENTIAL/PLATELET
Basophils Absolute: 0 10*3/uL (ref 0.0–0.1)
Basophils Relative: 0 % (ref 0–1)
Eosinophils Absolute: 0 10*3/uL (ref 0.0–0.7)
Eosinophils Relative: 0 % (ref 0–5)
HCT: 36.9 % — ABNORMAL LOW (ref 39.0–52.0)
HEMOGLOBIN: 12.5 g/dL — AB (ref 13.0–17.0)
LYMPHS ABS: 0.8 10*3/uL (ref 0.7–4.0)
LYMPHS PCT: 4 % — AB (ref 12–46)
MCH: 31.6 pg (ref 26.0–34.0)
MCHC: 33.9 g/dL (ref 30.0–36.0)
MCV: 93.2 fL (ref 78.0–100.0)
MONOS PCT: 12 % (ref 3–12)
Monocytes Absolute: 2.1 10*3/uL — ABNORMAL HIGH (ref 0.1–1.0)
Neutro Abs: 15.7 10*3/uL — ABNORMAL HIGH (ref 1.7–7.7)
Neutrophils Relative %: 84 % — ABNORMAL HIGH (ref 43–77)
PLATELETS: 219 10*3/uL (ref 150–400)
RBC: 3.96 MIL/uL — AB (ref 4.22–5.81)
RDW: 11.9 % (ref 11.5–15.5)
WBC: 18.6 10*3/uL — ABNORMAL HIGH (ref 4.0–10.5)

## 2015-01-27 LAB — PROTIME-INR
INR: 1.17 (ref 0.00–1.49)
PROTHROMBIN TIME: 15 s (ref 11.6–15.2)

## 2015-01-27 LAB — LIPASE, BLOOD: LIPASE: 41 U/L (ref 11–59)

## 2015-01-27 LAB — EXPECTORATED SPUTUM ASSESSMENT W GRAM STAIN, RFLX TO RESP C

## 2015-01-27 LAB — GLUCOSE, CAPILLARY
Glucose-Capillary: 130 mg/dL — ABNORMAL HIGH (ref 70–99)
Glucose-Capillary: 139 mg/dL — ABNORMAL HIGH (ref 70–99)

## 2015-01-27 LAB — INFLUENZA PANEL BY PCR (TYPE A & B)
H1N1FLUPCR: NOT DETECTED
Influenza A By PCR: NEGATIVE
Influenza B By PCR: NEGATIVE

## 2015-01-27 LAB — STREP PNEUMONIAE URINARY ANTIGEN: Strep Pneumo Urinary Antigen: NEGATIVE

## 2015-01-27 LAB — PROCALCITONIN: PROCALCITONIN: 0.25 ng/mL

## 2015-01-27 LAB — APTT: aPTT: 34 seconds (ref 24–37)

## 2015-01-27 MED ORDER — STROKE: EARLY STAGES OF RECOVERY BOOK
Freq: Once | Status: AC
  Administered 2015-01-27: 22:00:00
  Filled 2015-01-27: qty 1

## 2015-01-27 MED ORDER — ALPRAZOLAM 0.25 MG PO TABS: 0.2500 mg | ORAL_TABLET | Freq: Every evening | ORAL | Status: DC | PRN

## 2015-01-27 MED ORDER — CHOLECALCIFEROL 10 MCG (400 UNIT) PO TABS
400.0000 [IU] | ORAL_TABLET | Freq: Two times a day (BID) | ORAL | Status: DC
Start: 2015-01-27 — End: 2015-01-29
  Administered 2015-01-27 – 2015-01-29 (×5): 400 [IU] via ORAL
  Filled 2015-01-27 (×7): qty 1

## 2015-01-27 MED ORDER — DICYCLOMINE HCL 10 MG PO CAPS
10.0000 mg | ORAL_CAPSULE | Freq: Three times a day (TID) | ORAL | Status: DC | PRN
  Filled 2015-01-27: qty 1

## 2015-01-27 MED ORDER — LEVOFLOXACIN IN D5W 750 MG/150ML IV SOLN
750.0000 mg | INTRAVENOUS | Status: DC
  Administered 2015-01-29: 750 mg via INTRAVENOUS
  Filled 2015-01-27: qty 150

## 2015-01-27 MED ORDER — CLOPIDOGREL BISULFATE 75 MG PO TABS
75.0000 mg | ORAL_TABLET | Freq: Every day | ORAL | Status: DC
  Administered 2015-01-27 – 2015-01-29 (×3): 75 mg via ORAL
  Filled 2015-01-27 (×3): qty 1

## 2015-01-27 MED ORDER — LEVOFLOXACIN IN D5W 750 MG/150ML IV SOLN
750.0000 mg | INTRAVENOUS | Status: DC
Start: 2015-01-27 — End: 2015-01-27

## 2015-01-27 MED ORDER — LEVOFLOXACIN IN D5W 750 MG/150ML IV SOLN
750.0000 mg | Freq: Once | INTRAVENOUS | Status: AC
  Administered 2015-01-27: 750 mg via INTRAVENOUS
  Filled 2015-01-27: qty 150

## 2015-01-27 MED ORDER — POLYETHYLENE GLYCOL 3350 17 G PO PACK
17.0000 g | PACK | Freq: Every day | ORAL | Status: DC | PRN
  Administered 2015-01-27 – 2015-01-29 (×4): 17 g via ORAL
  Filled 2015-01-27 (×4): qty 1

## 2015-01-27 MED ORDER — PANTOPRAZOLE SODIUM 40 MG PO TBEC
40.0000 mg | DELAYED_RELEASE_TABLET | Freq: Every day | ORAL | Status: DC
  Administered 2015-01-27 – 2015-01-29 (×3): 40 mg via ORAL
  Filled 2015-01-27 (×3): qty 1

## 2015-01-27 MED ORDER — VITAMIN C 500 MG PO TABS
1000.0000 mg | ORAL_TABLET | Freq: Every day | ORAL | Status: DC
  Administered 2015-01-27 – 2015-01-29 (×3): 1000 mg via ORAL
  Filled 2015-01-27 (×3): qty 2

## 2015-01-27 MED ORDER — ASPIRIN EC 81 MG PO TBEC
81.0000 mg | DELAYED_RELEASE_TABLET | Freq: Every day | ORAL | Status: DC
  Administered 2015-01-27: 81 mg via ORAL
  Filled 2015-01-27 (×2): qty 1

## 2015-01-27 MED ORDER — HYDROXYZINE HCL 50 MG/ML IM SOLN
25.0000 mg | Freq: Four times a day (QID) | INTRAMUSCULAR | Status: DC | PRN
  Filled 2015-01-27: qty 0.5

## 2015-01-27 MED ORDER — INSULIN ASPART 100 UNIT/ML ~~LOC~~ SOLN
0.0000 [IU] | Freq: Three times a day (TID) | SUBCUTANEOUS | Status: DC
  Administered 2015-01-27: 1 [IU] via SUBCUTANEOUS
  Administered 2015-01-28 (×2): 2 [IU] via SUBCUTANEOUS
  Administered 2015-01-28 – 2015-01-29 (×3): 1 [IU] via SUBCUTANEOUS

## 2015-01-27 MED ORDER — ROSUVASTATIN CALCIUM 5 MG PO TABS
5.0000 mg | ORAL_TABLET | Freq: Every day | ORAL | Status: DC
  Administered 2015-01-27 – 2015-01-29 (×3): 5 mg via ORAL
  Filled 2015-01-27 (×4): qty 1

## 2015-01-27 MED ORDER — ARTIFICIAL TEARS OP OINT
TOPICAL_OINTMENT | Freq: Every day | OPHTHALMIC | Status: DC
  Administered 2015-01-27 – 2015-01-28 (×2): via OPHTHALMIC
  Filled 2015-01-27: qty 3.5

## 2015-01-27 MED ORDER — POLYETHYLENE GLYCOL 3350 17 GM/SCOOP PO POWD
17.0000 g | Freq: Every day | ORAL | Status: DC | PRN
  Filled 2015-01-27: qty 255

## 2015-01-27 MED ORDER — CALCIUM CITRATE 950 (200 CA) MG PO TABS
200.0000 mg | ORAL_TABLET | Freq: Two times a day (BID) | ORAL | Status: DC
  Administered 2015-01-27 – 2015-01-29 (×5): 200 mg via ORAL
  Filled 2015-01-27 (×8): qty 1

## 2015-01-27 MED ORDER — DIPHENHYDRAMINE HCL (SLEEP) 25 MG PO TABS: 25.0000 mg | ORAL_TABLET | Freq: Every evening | ORAL | Status: DC | PRN

## 2015-01-27 MED ORDER — POLYETHYL GLYCOL-PROPYL GLYCOL 0.4-0.3 % OP GEL: 1.0000 "application " | Freq: Every day | OPHTHALMIC | Status: DC

## 2015-01-27 MED ORDER — DIPHENHYDRAMINE HCL 25 MG PO CAPS
25.0000 mg | ORAL_CAPSULE | Freq: Every evening | ORAL | Status: DC | PRN
  Administered 2015-01-27 – 2015-01-28 (×2): 25 mg via ORAL
  Filled 2015-01-27 (×2): qty 1

## 2015-01-27 MED ORDER — CALCIUM CITRATE-VITAMIN D 500-400 MG-UNIT PO CHEW
1.0000 | CHEWABLE_TABLET | Freq: Two times a day (BID) | ORAL | Status: DC
  Filled 2015-01-27 (×2): qty 1

## 2015-01-27 MED ORDER — ADULT MULTIVITAMIN W/MINERALS CH
1.0000 | ORAL_TABLET | Freq: Every day | ORAL | Status: DC
  Administered 2015-01-27 – 2015-01-29 (×3): 1 via ORAL
  Filled 2015-01-27 (×3): qty 1

## 2015-01-27 MED ORDER — MIRABEGRON ER 25 MG PO TB24
50.0000 mg | ORAL_TABLET | Freq: Every day | ORAL | Status: DC
  Administered 2015-01-27 – 2015-01-28 (×2): 50 mg via ORAL
  Filled 2015-01-27 (×2): qty 2

## 2015-01-27 MED ORDER — SODIUM CHLORIDE 0.9 % IV SOLN: INTRAVENOUS | Status: DC

## 2015-01-27 MED ORDER — ALBUTEROL SULFATE (2.5 MG/3ML) 0.083% IN NEBU: 2.5000 mg | INHALATION_SOLUTION | RESPIRATORY_TRACT | Status: DC | PRN

## 2015-01-27 MED ORDER — SODIUM CHLORIDE 0.9 % IV BOLUS (SEPSIS)
2000.0000 mL | Freq: Once | INTRAVENOUS | Status: AC
  Administered 2015-01-27: 2000 mL via INTRAVENOUS

## 2015-01-27 MED ORDER — ACETAMINOPHEN 325 MG PO TABS: 650.0000 mg | ORAL_TABLET | Freq: Four times a day (QID) | ORAL | Status: DC | PRN

## 2015-01-27 MED ORDER — CALCIUM CITRATE-VITAMIN D 200-200 MG-UNIT PO TABS: 1.0000 | ORAL_TABLET | Freq: Two times a day (BID) | ORAL | Status: DC

## 2015-01-27 MED ORDER — SODIUM CHLORIDE 0.9 % IV SOLN
INTRAVENOUS | Status: DC
  Administered 2015-01-27: 22:00:00 via INTRAVENOUS

## 2015-01-27 MED ORDER — TAMSULOSIN HCL 0.4 MG PO CAPS
0.8000 mg | ORAL_CAPSULE | Freq: Every day | ORAL | Status: DC
  Administered 2015-01-27 – 2015-01-29 (×3): 0.8 mg via ORAL
  Filled 2015-01-27 (×3): qty 2

## 2015-01-27 MED ORDER — PSYLLIUM 95 % PO PACK
1.0000 | PACK | Freq: Every day | ORAL | Status: DC
  Administered 2015-01-28 – 2015-01-29 (×2): 1 via ORAL
  Filled 2015-01-27 (×3): qty 1

## 2015-01-27 MED ORDER — HEPARIN SODIUM (PORCINE) 5000 UNIT/ML IJ SOLN
5000.0000 [IU] | Freq: Three times a day (TID) | INTRAMUSCULAR | Status: DC
  Administered 2015-01-27 – 2015-01-29 (×7): 5000 [IU] via SUBCUTANEOUS
  Filled 2015-01-27 (×7): qty 1

## 2015-01-27 NOTE — Progress Notes (Signed)
Recd report and assumed care of patient. Telemetry box on and called to central monitoring. Patient instructed to remain NPO until swallowing can be assessed. Bed in low position, call bell in reach, family at bedside.

## 2015-01-27 NOTE — Progress Notes (Signed)
UR complete.  Aritzel Krusemark RN, MSN 

## 2015-01-27 NOTE — Evaluation (Addendum)
Clinical/Bedside Swallow Evaluation Patient Details  Name: Gregory Padilla MRN: [id number] Date of Birth: [birth year]  Today's Date: 01/27/2015 Time: SLP Start Time (ACUTE ONLY): 84 SLP Stop Time (ACUTE ONLY): 1235 SLP Time Calculation (min) (ACUTE ONLY): 15 min  Past Medical History:  Past Medical History  Diagnosis Date  . Diabetes mellitus age 81    no insulin  . Stroke   . Arthritis   . Carotid artery occlusion   . CVA (cerebral vascular accident)     left sided weakness  . Hyperlipidemia   . Hypertension   . Meniere disease   . Degenerative joint disease   . BPH (benign prostatic hyperplasia)   . Skin cancer     history of skin cancer  . Coccidioidomycosis    Past Surgical History:  Past Surgical History  Procedure Laterality Date  . Cholecystectomy    . Prostate surgery      x2  . Back surgery  2004  . Total knee arthroplasty     HPI:  Gregory Padilla is a 81 y.o. male with past medical history of hypertension, hyperlipidemia, diet-controlled diabetes, GERD, stroke with left-sided weakness, BPH, history of skin cancer, history of arthritis, history of carotid artery stenosis, who presents with fever, chills, left-sided weakness and facial droop. Pt found to ahve a small occipatal infarct. Pt also reports mild difficutly swallowing with occasional choking with thin liquids since prior CVA.    Assessment / Plan / Recommendation Clinical Impression  Pt demonstrates signs of a very mild baseline dysphagia with appearance of minimally delayed swallow and intermittent soft throat clear. Pt reports that when he takes larger sips sometimes he has to cough. Suspect pt does experience mild intermittent sensed penetration. Reinforced precautions, strategies and need for f/u with any worsening signs. Pt may initiate a regular diet with thin liquids. Cognitive linguistic function also WNL. No SLP f/u needed.     Aspiration Risk       Diet Recommendation Regular;Thin  liquid   Liquid Administration via: Cup;Straw Medication Administration: Whole meds with liquid Supervision: Patient able to self feed Compensations: Slow rate;Small sips/bites Postural Changes and/or Swallow Maneuvers: Seated upright 90 degrees    Other  Recommendations     Follow Up Recommendations  None    Frequency and Duration        Pertinent Vitals/Pain NA    SLP Swallow Goals     Swallow Study Prior Functional Status       General HPI: Gregory Padilla is a 81 y.o. male with past medical history of hypertension, hyperlipidemia, diet-controlled diabetes, GERD, stroke with left-sided weakness, BPH, history of skin cancer, history of arthritis, history of carotid artery stenosis, who presents with fever, chills, left-sided weakness and facial droop. Pt found to ahve a small occipatal infarct. Pt also reports mild difficutly swallowing with occasional choking with thin liquids since prior CVA.  Type of Study: Bedside swallow evaluation Diet Prior to this Study: NPO Temperature Spikes Noted: No Respiratory Status: Room air History of Recent Intubation: No Behavior/Cognition: Alert;Cooperative;Pleasant mood Oral Cavity - Dentition: Adequate natural dentition Self-Feeding Abilities: Able to feed self Patient Positioning: Upright in bed Baseline Vocal Quality: Clear Volitional Cough: Strong Volitional Swallow: Able to elicit    Oral/Motor/Sensory Function Overall Oral Motor/Sensory Function: Appears within functional limits for tasks assessed   Ice Chips     Thin Liquid Thin Liquid: Impaired Presentation: Cup;Straw;Self Fed Pharyngeal  Phase Impairments: Suspected delayed Swallow;Throat Clearing - Immediate  Nectar Thick Nectar Thick Liquid: Not tested   Honey Thick Honey Thick Liquid: Not tested   Puree Puree: Within functional limits   Solid   GO    Solid: Not tested      Supervised and reviewed by [facility] MA CCC-SLP  Vineet Kinney, Katherene Ponto 01/27/2015,1:33 PM

## 2015-01-27 NOTE — ED Notes (Signed)
Per EMS:  Pt from home.  Sts he went to [employer], felt a chill and got to his car where he felt weak and went home.  When he got home he was walking to his bedroom and felt weak and sat down on the ground where he had several bouts of n/v.  Daughter sts she felt like she saw facial droop, none noted by EMS or RN at this time.  Pt AxO in room.  4mg  of Zofran given en route.

## 2015-01-27 NOTE — Progress Notes (Signed)
Pt arrived to unit alert and oriented x4. Oriented to Room. Questions answered. Will continue to monitor. Bobbye Charleston, RN

## 2015-01-27 NOTE — ED Notes (Signed)
MD at bedside. 

## 2015-01-27 NOTE — ED Notes (Signed)
Family at bedside. 

## 2015-01-27 NOTE — Progress Notes (Addendum)
81 year old male with h/o hypertension, DM, GERD, h/o stroke with left sided weakness, comes inf or  Worsening left sided weakness and facial droop earlier this am. Please see Dr Blaine Hamper admission note for more information. On arrival to ED he was found to have sepsis from CAP in addition to this left sided weakness. He was admitted for evaluation of recurrent stroke and management of sepsis and CAP.   Exam. He is alert and comfortable.  CVS s1s2 Lungs clear Abdomen: soft non tender non distended bowel sounds.  Extremities: no pedal edema.   Continue to monitor. Neurology consulted.    Hosie Poisson MD [phone]

## 2015-01-27 NOTE — ED Notes (Signed)
Radiology called to inquire about CT scan.

## 2015-01-27 NOTE — ED Provider Notes (Signed)
CSN: [id number]     Arrival date & time 01/26/15  2352 History   First MD Initiated Contact with Patient 01/26/15 2353     This chart was scribed for Ivor Costa, MD by Forrestine Him, ED Scribe. This patient was seen in room A10C/A10C and the patient's care was started 12:02 AM.   Chief Complaint  Patient presents with  . Weakness   The history is provided by the patient and a relative. No language interpreter was used.    HPI Comments: ARAS ALBARRAN brought in by ambulance is a 81 y.o. male with a PMHx of DM, stroke, CVA, carotid artery occlusion, hyperlipidemia, HTN, skin cancer, and degenerative joint disease who presents to the Emergency Department complaining of sudden onset, generalized weakness onset just prior to arrival. Per family, pt came back from [place] this evening with his wife where he developed a "chill". After arriving home, pt experienced some weakness which caused his to collapse to the ground. No head trauma or LOC. Pt then experienced 3 episodes of vomiting along with trouble speaking, low grade fever, and mild facial droop. At this time, pt denies any persistence of majority of symptoms or pain. He has mild persistent Left sided weakness. No CP, chills, diarrhea, or abdominal pain. Mr. Vandiver admits to 4 previous strokes which effected his left side.   Past Medical History  Diagnosis Date  . Diabetes mellitus age 71    no insulin  . Stroke   . Arthritis   . Carotid artery occlusion   . CVA (cerebral vascular accident)     left sided weakness  . Hyperlipidemia   . Hypertension   . Meniere disease   . Degenerative joint disease   . BPH (benign prostatic hyperplasia)   . Skin cancer     history of skin cancer  . Coccidioidomycosis    Past Surgical History  Procedure Laterality Date  . Cholecystectomy    . Prostate surgery      x2  . Back surgery  2004  . Total knee arthroplasty     Family History  Problem Relation Age of Onset  . Diabetes Mother    . Arthritis Mother   . Coronary artery disease Father   . Heart disease Father   . Stroke Neg Hx    History  Substance Use Topics  . Smoking status: Never Smoker   . Smokeless tobacco: Never Used  . Alcohol Use: No    Review of Systems  Constitutional: Positive for fever and chills. Negative for fatigue.  Respiratory: Negative for cough and shortness of breath.   Cardiovascular: Negative for chest pain, palpitations and leg swelling.  Gastrointestinal: Positive for nausea and vomiting. Negative for abdominal pain, diarrhea and constipation.  Genitourinary: Negative for dysuria, frequency and hematuria.  Musculoskeletal: Negative for myalgias, back pain, neck pain and neck stiffness.  Skin: Negative for rash and wound.  Neurological: Positive for weakness. Negative for dizziness, syncope, light-headedness, numbness and headaches.  Psychiatric/Behavioral: Negative for confusion.  All other systems reviewed and are negative.     Allergies  Amitriptyline  Home Medications   Prior to Admission medications   Medication Sig Start Date End Date Taking? Authorizing Provider  acetaminophen (TYLENOL) 500 MG tablet Take 1,000 mg by mouth at bedtime. For fever    Yes Historical Provider, MD  ALPRAZolam (XANAX) 0.25 MG tablet Take 0.25 mg by mouth at bedtime as needed. For anxiety   Yes Historical Provider, MD  Ascorbic Acid (VITAMIN C  PO) Take 1,000 mg by mouth daily.    Yes Historical Provider, MD  aspirin 81 MG tablet Take 81 mg by mouth daily.     Yes Historical Provider, MD  calcium citrate-vitamin D 200-200 MG-UNIT TABS Take 1 tablet by mouth 2 (two) times daily.     Yes Historical Provider, MD  clopidogrel (PLAVIX) 75 MG tablet Take 75 mg by mouth daily.     Yes Historical Provider, MD  dicyclomine (BENTYL) 10 MG capsule Take 10 mg by mouth 3 (three) times daily as needed for spasms.   Yes Historical Provider, MD  diphenhydrAMINE (SOMINEX) 25 MG tablet Take 25 mg by mouth at  bedtime as needed. For allergy   Yes Historical Provider, MD  esomeprazole (NEXIUM) 40 MG capsule Take 40 mg by mouth daily before breakfast.     Yes Historical Provider, MD  mirabegron ER (MYRBETRIQ) 50 MG TB24 tablet Take 50 mg by mouth at bedtime.   Yes Historical Provider, MD  Multiple Vitamin (MULTIVITAMIN WITH MINERALS) TABS tablet Take 1 tablet by mouth daily.   Yes Historical Provider, MD  Polyethyl Glycol-Propyl Glycol (SYSTANE OP) Place 1-2 drops into both eyes 2 (two) times daily.    Yes Historical Provider, MD  Polyethyl Glycol-Propyl Glycol 0.4-0.3 % GEL Apply 1 application to eye at bedtime.   Yes Historical Provider, MD  polyethylene glycol powder (MIRALAX) powder Take 17 g by mouth daily as needed for mild constipation.    Yes Historical Provider, MD  rosuvastatin (CRESTOR) 5 MG tablet Take 5 mg by mouth daily.     Yes Historical Provider, MD  tamsulosin (FLOMAX) 0.4 MG CAPS capsule Take 0.8 mg by mouth daily.   Yes Historical Provider, MD  hyoscyamine (LEVBID) 0.375 MG 12 hr tablet Take 1 tablet (0.375 mg total) by mouth 2 (two) times daily. Patient not taking: Reported on 01/27/2015 08/21/13   Milus Banister, MD  Linaclotide [facility]) 145 MCG CAPS capsule Take 1 capsule (145 mcg total) by mouth daily. Patient not taking: Reported on 01/27/2015 09/03/13   Milus Banister, MD  psyllium (METAMUCIL) 58.6 % powder Take 1 packet by mouth 3 (three) times daily.    Historical Provider, MD   Triage Vitals: BP 103/60 mmHg  Pulse 65  Temp(Src) 99.2 F (37.3 C) (Oral)  Resp 20  SpO2 95%   Physical Exam  Constitutional: He is oriented to person, place, and time. He appears well-developed and well-nourished. No distress.  HENT:  Head: Normocephalic and atraumatic.  Mouth/Throat: Oropharynx is clear and moist. No oropharyngeal exudate.  Midface is stable. No evidence of trauma.  Eyes: EOM are normal. Pupils are equal, round, and reactive to light.  Neck: Normal range of motion. Neck  supple.  No posterior midline cervical tenderness to palpation. No evidence of meningismus.  Cardiovascular: Normal rate and regular rhythm.  Exam reveals no gallop and no friction rub.   No murmur heard. Pulmonary/Chest: Effort normal and breath sounds normal. No respiratory distress. He has no wheezes. He has no rales. He exhibits no tenderness.  Abdominal: Soft. Bowel sounds are normal. He exhibits no distension and no mass. There is no tenderness. There is no rebound and no guarding.  Musculoskeletal: Normal range of motion. He exhibits no edema or tenderness.  No midline thoracic or lumbar tenderness. Pelvis stable. No calf swelling or tenderness.  Neurological: He is alert and oriented to person, place, and time.  Cranial nerves II through XII grossly intact. 5/5 motor in the right  upper and right lower extremities. 4/5 motor in left upper and left lower extremities consistent with previous stroke. Sensation is fully intact. Bilateral finger-to-nose is intact.  Skin: Skin is warm and dry. No rash noted. No erythema.  Psychiatric: He has a normal mood and affect. His behavior is normal.  Nursing note and vitals reviewed.   ED Course  Procedures (including critical care time)  DIAGNOSTIC STUDIES: Oxygen Saturation is 95% on RA, adequate by my interpretation.    COORDINATION OF CARE: 5:50 AM-Discussed treatment plan with pt at bedside and pt agreed to plan.     Labs Review Labs Reviewed  CBC WITH DIFFERENTIAL/PLATELET - Abnormal; Notable for the following:    WBC 18.6 (*)    RBC 3.96 (*)    Hemoglobin 12.5 (*)    HCT 36.9 (*)    Neutrophils Relative % 84 (*)    Neutro Abs 15.7 (*)    Lymphocytes Relative 4 (*)    Monocytes Absolute 2.1 (*)    All other components within normal limits  COMPREHENSIVE METABOLIC PANEL - Abnormal; Notable for the following:    Sodium 133 (*)    Glucose, Bld 166 (*)    GFR calc non Af Amer 53 (*)    GFR calc Af Amer 61 (*)    All other  components within normal limits  TROPONIN I  URINALYSIS, ROUTINE W REFLEX MICROSCOPIC    Imaging Review Ct Head Wo Contrast  01/27/2015   CLINICAL DATA:  Weakness.  EXAM: CT HEAD WITHOUT CONTRAST  TECHNIQUE: Contiguous axial images were obtained from the base of the skull through the vertex without intravenous contrast.  COMPARISON:  Brain MRI 09/17/2011 and CT 09/15/2011  FINDINGS: Chronic left parietal infarct is again seen. There is a chronic lacunar infarct in the right caudate which is new. Patchy hypodensities in the cerebral white matter have slightly progressed from the prior study and are most compatible with chronic small vessel ischemia. There is no definite evidence of acute large territory infarct, intracranial hemorrhage, mass, midline shift, or extra-axial fluid collection. There is mild cerebral atrophy.  Orbits are unremarkable. Visualized mastoid air cells and paranasal sinuses are clear. Mild carotid siphon and vertebral artery calcification is noted.  IMPRESSION: 1. No acute intracranial abnormality identified. 2. Chronic infarcts and chronic small vessel ischemic disease.   Electronically Signed   By: Logan Bores   On: 01/27/2015 05:02   Dg Chest Port 1 View  01/27/2015   CLINICAL DATA:  Initial evaluation for acute weakness.  EXAM: PORTABLE CHEST - 1 VIEW  COMPARISON:  Prior study from 09/15/2011  FINDINGS: The cardiac and mediastinal silhouettes are stable in size and contour, and remain within normal limits. Atheromatous plaque present within the aortic arch.  The lungs are hypoinflated. Patchy and streaky bibasilar opacities favored to reflect atelectasis/bronchovascular crowding. Superimposed patchy infiltrates not entirely excluded, particularly within the retrocardiac left lower lobe. No pulmonary edema or pleural effusion. No pneumothorax.  No acute osseous abnormality.  IMPRESSION: Shallow lung inflation with mild patchy and linear bibasilar opacities, left greater than  right. Findings favored to reflect bronchovascular crowding and/or atelectasis. Possible infiltrates not entirely excluded, and could be considered in the correct clinical setting.   Electronically Signed   By: Jeannine Boga M.D.   On: 01/27/2015 03:49     EKG Interpretation None     ED ECG REPORT   Date: 01/27/2015  Rate: 84  Rhythm: normal sinus rhythm  QRS Axis: normal  Intervals:  QRS prolonged  ST/T Wave abnormalities: normal  Conduction Disutrbances:right bundle branch block  Narrative Interpretation:   Old EKG Reviewed: changes noted Last EKG from 2012 without evidence of right bundle branch block I have personally reviewed the EKG tracing and agree with the computerized printout as noted.  MDM   Final diagnoses:  Weakness  Community acquired pneumonia      I personally performed the services described in this documentation, which was scribed in my presence. The recorded information has been reviewed and is accurate.  Patient with patchy infiltrate left greater than right on x-ray. Has low-grade fever and elevated white blood cell count the emergency department. We'll treat for community acquired pneumonia. CT head without any acute findings.  Discussed with hospitalist and will admit to telemetry bed.    Julianne Rice, MD 01/27/15 [phone]

## 2015-01-27 NOTE — Progress Notes (Addendum)
ANTIBIOTIC CONSULT NOTE - INITIAL  Pharmacy Consult for Levaquin Indication: CAP  Allergies  Allergen Reactions  . Amitriptyline     unknown    Patient Measurements:    Vital Signs: Temp: 99.2 F (37.3 C) (04/19 0344) Temp Source: Oral (04/19 0344) BP: 132/54 mmHg (04/19 0600) Pulse Rate: 67 (04/19 0600) Intake/Output from previous day: 04/18 0701 - 04/19 0700 In: -  Out: 200 [Urine:200] Intake/Output from this shift:    Labs:  Recent Labs  01/27/15 0021  WBC 18.6*  HGB 12.5*  PLT 219  CREATININE 1.20   CrCl cannot be calculated (Unknown ideal weight.). No results for input(s): VANCOTROUGH, VANCOPEAK, VANCORANDOM, GENTTROUGH, GENTPEAK, GENTRANDOM, TOBRATROUGH, TOBRAPEAK, TOBRARND, AMIKACINPEAK, AMIKACINTROU, AMIKACIN in the last 72 hours.   Microbiology: No results found for this or any previous visit (from the past 720 hour(s)).  Medical History: Past Medical History  Diagnosis Date  . Diabetes mellitus age 89    no insulin  . Stroke   . Arthritis   . Carotid artery occlusion   . CVA (cerebral vascular accident)     left sided weakness  . Hyperlipidemia   . Hypertension   . Meniere disease   . Degenerative joint disease   . BPH (benign prostatic hyperplasia)   . Skin cancer     history of skin cancer  . Coccidioidomycosis     Medications:  Prescriptions prior to admission  Medication Sig Dispense Refill Last Dose  . acetaminophen (TYLENOL) 500 MG tablet Take 1,000 mg by mouth at bedtime. For fever    01/26/2015 at Unknown time  . ALPRAZolam (XANAX) 0.25 MG tablet Take 0.25 mg by mouth at bedtime as needed. For anxiety   Past Month at Unknown time  . Ascorbic Acid (VITAMIN C PO) Take 1,000 mg by mouth daily.    01/26/2015 at Unknown time  . aspirin 81 MG tablet Take 81 mg by mouth daily.     01/26/2015 at Unknown time  . calcium citrate-vitamin D 200-200 MG-UNIT TABS Take 1 tablet by mouth 2 (two) times daily.     01/26/2015 at Unknown time  .  clopidogrel (PLAVIX) 75 MG tablet Take 75 mg by mouth daily.     01/26/2015 at Unknown time  . dicyclomine (BENTYL) 10 MG capsule Take 10 mg by mouth 3 (three) times daily as needed for spasms.   unk  . diphenhydrAMINE (SOMINEX) 25 MG tablet Take 25 mg by mouth at bedtime as needed. For allergy   01/26/2015 at Unknown time  . esomeprazole (NEXIUM) 40 MG capsule Take 40 mg by mouth daily before breakfast.     01/26/2015 at Unknown time  . mirabegron ER (MYRBETRIQ) 50 MG TB24 tablet Take 50 mg by mouth at bedtime.   01/26/2015 at Unknown time  . Multiple Vitamin (MULTIVITAMIN WITH MINERALS) TABS tablet Take 1 tablet by mouth daily.   01/26/2015 at Unknown time  . Polyethyl Glycol-Propyl Glycol (SYSTANE OP) Place 1-2 drops into both eyes 2 (two) times daily.    01/26/2015 at Unknown time  . Polyethyl Glycol-Propyl Glycol 0.4-0.3 % GEL Apply 1 application to eye at bedtime.   01/26/2015 at Unknown time  . polyethylene glycol powder (MIRALAX) powder Take 17 g by mouth daily as needed for mild constipation.    unk  . rosuvastatin (CRESTOR) 5 MG tablet Take 5 mg by mouth daily.     01/26/2015 at Unknown time  . tamsulosin (FLOMAX) 0.4 MG CAPS capsule Take 0.8 mg by mouth daily.  01/26/2015 at Unknown time  . hyoscyamine (LEVBID) 0.375 MG 12 hr tablet Take 1 tablet (0.375 mg total) by mouth 2 (two) times daily. (Patient not taking: Reported on 01/27/2015) 60 tablet 4 Taking  . Linaclotide (LINZESS) 145 MCG CAPS capsule Take 1 capsule (145 mcg total) by mouth daily. (Patient not taking: Reported on 01/27/2015) 120 capsule 0   . psyllium (METAMUCIL) 58.6 % powder Take 1 packet by mouth 3 (three) times daily.   Taking   Assessment: 81 y.o. male presents with fever, chills, L-sided weakness and facial droop. Pt on levaquin (Day #1) for CAP. SCr 1.2, est CrCl 44 ml/min. WBC elevated to 18.6. Tm 100.7, Tc 99.2. Chest x-ray showed bilateral patchy opacity (L>R). Culture pending.  Goal of Therapy:  Resolution of  infection  Plan:  Levaquin 750mg  IV q48h Will f/u renal function, pt's clinical condition, and micro data Pharmacy will sign off and follow peripherally via decision support software - please reconsult if needed  Sherlon Handing, PharmD, BCPS Clinical pharmacist, pager [phone] 01/27/2015,8:47 AM

## 2015-01-27 NOTE — ED Notes (Signed)
Admitting MD at BS.  

## 2015-01-27 NOTE — Consult Note (Signed)
Referring Physician: Karleen Hampshire    Chief Complaint: Left sided weakness and facial droop in setting of possible infection.   HPI:                                                                                                                                         Gregory Padilla is an 81 y.o. male presenting to hospital with left sided weakness in setting of low grade fever, diarrhea, N/V, and elevated WBC. patient reports he has had a stroke in the past with residual left sided weakness. Per family facial droop lasted for about 2 hours. Chest x-ray showed bilateral patchy opacity (L>R). WBC 18.6, temperature 100.7, heart rate 61, negative urinalysis, negative troponin. EKG showed bifascicular AV block which is slightly worse than previous EKG on 09/25/11.  Neurology asked to assess for possible CVA or TIA  Date last known well: Date: 01/26/2015 Time last known well: Unable to determine tPA Given: No: out of window Modified Rankin: Rankin Score=0    Past Medical History  Diagnosis Date  . Diabetes mellitus age 24    no insulin  . Stroke   . Arthritis   . Carotid artery occlusion   . CVA (cerebral vascular accident)     left sided weakness  . Hyperlipidemia   . Hypertension   . Meniere disease   . Degenerative joint disease   . BPH (benign prostatic hyperplasia)   . Skin cancer     history of skin cancer  . Coccidioidomycosis     Past Surgical History  Procedure Laterality Date  . Cholecystectomy    . Prostate surgery      x2  . Back surgery  2004  . Total knee arthroplasty      Family History  Problem Relation Age of Onset  . Diabetes Mother   . Arthritis Mother   . Coronary artery disease Father   . Heart disease Father   . Stroke Neg Hx    Social History:  reports that he has never smoked. He has never used smokeless tobacco. He reports that he does not drink alcohol or use illicit drugs.  Allergies:  Allergies  Allergen Reactions  . Amitriptyline     unknown     Medications:  Prior to Admission:  Prescriptions prior to admission  Medication Sig Dispense Refill Last Dose  . acetaminophen (TYLENOL) 500 MG tablet Take 1,000 mg by mouth at bedtime. For fever    01/26/2015 at Unknown time  . ALPRAZolam (XANAX) 0.25 MG tablet Take 0.25 mg by mouth at bedtime as needed. For anxiety   Past Month at Unknown time  . Ascorbic Acid (VITAMIN C PO) Take 1,000 mg by mouth daily.    01/26/2015 at Unknown time  . aspirin 81 MG tablet Take 81 mg by mouth daily.     01/26/2015 at Unknown time  . calcium citrate-vitamin D 200-200 MG-UNIT TABS Take 1 tablet by mouth 2 (two) times daily.     01/26/2015 at Unknown time  . clopidogrel (PLAVIX) 75 MG tablet Take 75 mg by mouth daily.     01/26/2015 at Unknown time  . dicyclomine (BENTYL) 10 MG capsule Take 10 mg by mouth 3 (three) times daily as needed for spasms.   unk  . diphenhydrAMINE (SOMINEX) 25 MG tablet Take 25 mg by mouth at bedtime as needed. For allergy   01/26/2015 at Unknown time  . esomeprazole (NEXIUM) 40 MG capsule Take 40 mg by mouth daily before breakfast.     01/26/2015 at Unknown time  . mirabegron ER (MYRBETRIQ) 50 MG TB24 tablet Take 50 mg by mouth at bedtime.   01/26/2015 at Unknown time  . Multiple Vitamin (MULTIVITAMIN WITH MINERALS) TABS tablet Take 1 tablet by mouth daily.   01/26/2015 at Unknown time  . Polyethyl Glycol-Propyl Glycol (SYSTANE OP) Place 1-2 drops into both eyes 2 (two) times daily.    01/26/2015 at Unknown time  . Polyethyl Glycol-Propyl Glycol 0.4-0.3 % GEL Apply 1 application to eye at bedtime.   01/26/2015 at Unknown time  . polyethylene glycol powder (MIRALAX) powder Take 17 g by mouth daily as needed for mild constipation.    unk  . rosuvastatin (CRESTOR) 5 MG tablet Take 5 mg by mouth daily.     01/26/2015 at Unknown time  . tamsulosin (FLOMAX) 0.4 MG CAPS capsule  Take 0.8 mg by mouth daily.   01/26/2015 at Unknown time  . hyoscyamine (LEVBID) 0.375 MG 12 hr tablet Take 1 tablet (0.375 mg total) by mouth 2 (two) times daily. (Patient not taking: Reported on 01/27/2015) 60 tablet 4 Taking  . Linaclotide (LINZESS) 145 MCG CAPS capsule Take 1 capsule (145 mcg total) by mouth daily. (Patient not taking: Reported on 01/27/2015) 120 capsule 0   . psyllium (METAMUCIL) 58.6 % powder Take 1 packet by mouth 3 (three) times daily.   Taking   Scheduled: .  stroke: mapping our early stages of recovery book   Does not apply Once  . artificial tears   Both Eyes QHS  . aspirin EC  81 mg Oral Daily  . calcium citrate  200 mg of elemental calcium Oral BID   And  . cholecalciferol  400 Units Oral BID  . clopidogrel  75 mg Oral Daily  . heparin  5,000 Units Subcutaneous 3 times per day  . insulin aspart  0-9 Units Subcutaneous TID WC  . [START ON 01/29/2015] levofloxacin (LEVAQUIN) IV  750 mg Intravenous Q48H  . mirabegron ER  50 mg Oral QHS  . multivitamin with minerals  1 tablet Oral Daily  . pantoprazole  40 mg Oral Daily  . psyllium  1 packet Oral Daily  . rosuvastatin  5 mg Oral Daily  . tamsulosin  0.8 mg Oral  Daily  . vitamin C  1,000 mg Oral Daily   Continuous: . sodium chloride      ROS:                                                                                                                                       History obtained from the patient  General ROS: negative for - chills, fatigue, fever, night sweats, weight gain or weight loss Psychological ROS: negative for - behavioral disorder, hallucinations, memory difficulties, mood swings or suicidal ideation Ophthalmic ROS: negative for - blurry vision, double vision, eye pain or loss of vision ENT ROS: negative for - epistaxis, nasal discharge, oral lesions, sore throat, tinnitus or vertigo Allergy and Immunology ROS: negative for - hives or itchy/watery eyes Hematological and Lymphatic ROS:  negative for - bleeding problems, bruising or swollen lymph nodes Endocrine ROS: negative for - galactorrhea, hair pattern changes, polydipsia/polyuria or temperature intolerance Respiratory ROS: negative for - cough, hemoptysis, shortness of breath or wheezing Cardiovascular ROS: negative for - chest pain, dyspnea on exertion, edema or irregular heartbeat Gastrointestinal ROS: negative for - abdominal pain, diarrhea, hematemesis, nausea/vomiting or stool incontinence Genito-Urinary ROS: negative for - dysuria, hematuria, incontinence or urinary frequency/urgency Musculoskeletal ROS: negative for - joint swelling or muscular weakness Neurological ROS: as noted in HPI Dermatological ROS: negative for rash and skin lesion changes  Neurologic Examination:                                                                                                      Blood pressure 144/68, pulse 57, temperature 98.8 F (37.1 C), temperature source Oral, resp. rate 16, SpO2 98 %.  HEENT-  Normocephalic, no lesions, without obvious abnormality.  Normal external eye and conjunctiva.  Normal TM's bilaterally.  Normal auditory canals and external ears. Normal external nose, mucus membranes and septum.  Normal pharynx. Cardiovascular- S1, S2 normal, pulses palpable throughout   Lungs- chest clear, no wheezing, rales, normal symmetric air entry Abdomen- soft, non-tender; bowel sounds normal; no masses,  no organomegaly Extremities- no edema Lymph-no adenopathy palpable Musculoskeletal-no joint tenderness, deformity or swelling Skin-warm and dry, no hyperpigmentation, vitiligo, or suspicious lesions  Neurological Examination Mental Status: Alert, oriented, thought content appropriate.  Speech fluent without evidence of aphasia.  Able to follow 3 step commands without difficulty. Cranial Nerves: II: Discs flat bilaterally; Visual fields grossly normal, pupils equal, round, reactive to light and  accommodation III,IV, VI: ptosis not present, extra-ocular motions intact bilaterally  V,VII: smile symmetric, facial light touch sensation normal bilaterally VIII: hearing decreased bilaterally IX,X: uvula rises symmetrically XI: bilateral shoulder shrug XII: midline tongue extension Motor: Right : Upper extremity   5/5    Left:     Upper extremity   5/5  Lower extremity   5/5     Lower extremity   5/5 Tone and bulk:normal tone throughout; no atrophy noted Sensory: Pinprick and light touch intact throughout, bilaterally Deep Tendon Reflexes: 1+ and symmetric throughout Plantars: Right: downgoing   Left: downgoing Cerebellar: normal finger-to-nose, normal rapid alternating movements and normal heel-to-shin test Gait: not tested due to safety       Lab Results: Basic Metabolic Panel:  Recent Labs Lab 01/27/15 0021  NA 133*  K 3.6  CL 98  CO2 25  GLUCOSE 166*  BUN 7  CREATININE 1.20  CALCIUM 9.0    Liver Function Tests:  Recent Labs Lab 01/27/15 0021  AST 24  ALT 22  ALKPHOS 71  BILITOT 0.8  PROT 6.4  ALBUMIN 4.3    Recent Labs Lab 01/26/15 0021  LIPASE 41   No results for input(s): AMMONIA in the last 168 hours.  CBC:  Recent Labs Lab 01/27/15 0021  WBC 18.6*  NEUTROABS 15.7*  HGB 12.5*  HCT 36.9*  MCV 93.2  PLT 219    Cardiac Enzymes:  Recent Labs Lab 01/27/15 0021  TROPONINI <0.03    Lipid Panel: No results for input(s): CHOL, TRIG, HDL, CHOLHDL, VLDL, LDLCALC in the last 168 hours.  CBG: No results for input(s): GLUCAP in the last 168 hours.  Microbiology: Results for orders placed or performed during the hospital encounter of 09/15/11  Culture, blood (routine x 2)     Status: None   Collection Time: 09/16/11 12:05 AM  Result Value Ref Range Status   Specimen Description BLOOD LEFT ARM  Final   Special Requests BOTTLES DRAWN AEROBIC AND ANAEROBIC [facility]  Final   Culture  Setup Time [id number]  Final   Culture NO  GROWTH 5 DAYS  Final   Report Status 09/22/2011 FINAL  Final  Culture, blood (routine x 2)     Status: None   Collection Time: 09/16/11 12:16 AM  Result Value Ref Range Status   Specimen Description BLOOD RIGHT HAND  Final   Special Requests BOTTLES DRAWN AEROBIC AND ANAEROBIC [facility]  Final   Culture  Setup Time [id number]  Final   Culture NO GROWTH 5 DAYS  Final   Report Status 09/22/2011 FINAL  Final  Urine culture     Status: None   Collection Time: 09/16/11  4:24 AM  Result Value Ref Range Status   Specimen Description URINE, RANDOM  Final   Special Requests NONE  Final   Culture  Setup Time [id number]  Final   Colony Count >=100,000 COLONIES/ML  Final   Culture ESCHERICHIA COLI  Final   Report Status 09/17/2011 FINAL  Final   Organism ID, Bacteria ESCHERICHIA COLI  Final      Susceptibility   Escherichia coli - MIC*    AMPICILLIN 4 Sensitive     CEFAZOLIN <=4 Sensitive     CEFTRIAXONE <=1 Sensitive     CIPROFLOXACIN <=0.25 Sensitive     GENTAMICIN <=1 Sensitive     LEVOFLOXACIN <=0.12 Sensitive     NITROFURANTOIN <=16 Sensitive     TOBRAMYCIN <=1 Sensitive     TRIMETH/SULFA <=20 Sensitive     * ESCHERICHIA COLI    Coagulation Studies:  No results for input(s): LABPROT, INR in the last 72 hours.  Imaging: Ct Head Wo Contrast  01/27/2015   CLINICAL DATA:  Weakness.  EXAM: CT HEAD WITHOUT CONTRAST  TECHNIQUE: Contiguous axial images were obtained from the base of the skull through the vertex without intravenous contrast.  COMPARISON:  Brain MRI 09/17/2011 and CT 09/15/2011  FINDINGS: Chronic left parietal infarct is again seen. There is a chronic lacunar infarct in the right caudate which is new. Patchy hypodensities in the cerebral white matter have slightly progressed from the prior study and are most compatible with chronic small vessel ischemia. There is no definite evidence of acute large territory infarct, intracranial hemorrhage, mass, midline shift, or  extra-axial fluid collection. There is mild cerebral atrophy.  Orbits are unremarkable. Visualized mastoid air cells and paranasal sinuses are clear. Mild carotid siphon and vertebral artery calcification is noted.  IMPRESSION: 1. No acute intracranial abnormality identified. 2. Chronic infarcts and chronic small vessel ischemic disease.   Electronically Signed   By: Logan Bores   On: 01/27/2015 05:02   Dg Chest Port 1 View  01/27/2015   CLINICAL DATA:  Initial evaluation for acute weakness.  EXAM: PORTABLE CHEST - 1 VIEW  COMPARISON:  Prior study from 09/15/2011  FINDINGS: The cardiac and mediastinal silhouettes are stable in size and contour, and remain within normal limits. Atheromatous plaque present within the aortic arch.  The lungs are hypoinflated. Patchy and streaky bibasilar opacities favored to reflect atelectasis/bronchovascular crowding. Superimposed patchy infiltrates not entirely excluded, particularly within the retrocardiac left lower lobe. No pulmonary edema or pleural effusion. No pneumothorax.  No acute osseous abnormality.  IMPRESSION: Shallow lung inflation with mild patchy and linear bibasilar opacities, left greater than right. Findings favored to reflect bronchovascular crowding and/or atelectasis. Possible infiltrates not entirely excluded, and could be considered in the correct clinical setting.   Electronically Signed   By: Jeannine Boga M.D.   On: 01/27/2015 03:49       Assessment and plan discussed with with attending physician and they are in agreement.    Etta Quill PA-C [facility] [phone]  01/27/2015, 10:22 AM   Assessment: 81 y.o. male presenting with left sided weakness which has fully resolved at this time. Given his multiple stroke risk factors cannot exclude possible CVA/TIA  Stroke Risk Factors - diabetes mellitus, hyperlipidemia and hypertension  1. HgbA1c, fasting lipid panel 2. MRI, MRA  of the brain without contrast 3. PT  consult, OT consult, Speech consult 4. Echocardiogram 5. Carotid dopplers 6. Prophylactic therapy-Antiplatelet med: Aspirin  and  Plavix - current home dose 7. Risk factor modification 8. Telemetry monitoring 9. Frequent neuro checks 10 NPO until passes stroke swallow screen  Jim Like, DO [facility] [phone]  If 7pm- 7am, please page neurology on call as listed in [address].

## 2015-01-27 NOTE — H&P (Signed)
[facility] History and Physical  Gregory Padilla GQQ:[id number] DOB: 28-May-1925 DOA: 01/26/2015  Referring physician: ED physician  PCP: Morton Peters, MD  Specialists:   Chief Complaint: Fever, chills, left-sided weakness and facial droop  HPI: Gregory Padilla is a 81 y.o. male with past medical history of hypertension, hyperlipidemia, diet-controlled diabetes, GERD, stroke with left-sided weakness, BPH, history of skin cancer, history of arthritis, history of carotid artery stenosis, who presents with fever, chills, left-sided weakness and facial droop.  Patient reports that he developed fever and "chill" last night. It is associated with nausea and vomiting. He vomited 3 times without blood in the vomitus. Patient does not have abdominal pain or diarrhea. He states that had hx of stroke with mild left side weakness, but his left side weakness is much worse last night, which caused his to collapse to the ground. No head trauma or LOC. He also has left facial droop per his wife. His left side weakness lasted from 11:00 PM to 1:30 AM per his son. Patient reports that he has mild shortness of breath, but no cough or chest pain.  ROS: currently patient denies running nose, ear pain, headaches, cough, chest pain, abdominal pain, diarrhea, dysuria, urgency, frequency, hematuria, skin rashes or leg swelling. No vision change or hearing loss.  In ED, patient was found to have negative CT head for acute abnormalities. Chest x-ray showed bilateral patchy opacity (L>R). WBC 18.6, temperature 100.7, heart rate 61, negative urinalysis, negative troponin. EKG showed bifascicular AV block which is slightly worse than previous EKG on 09/25/11. QTc interval 474. Patient is admitted to inpatient for further evaluation and treatment.  Review of Systems: As presented in the history of presenting illness, rest negative.  Where does patient live?  At home Can patient participate in ADLs?  Barely  Allergy:  Allergies  Allergen Reactions  . Amitriptyline     unknown    Past Medical History  Diagnosis Date  . Diabetes mellitus age 39    no insulin  . Stroke   . Arthritis   . Carotid artery occlusion   . CVA (cerebral vascular accident)     left sided weakness  . Hyperlipidemia   . Hypertension   . Meniere disease   . Degenerative joint disease   . BPH (benign prostatic hyperplasia)   . Skin cancer     history of skin cancer  . Coccidioidomycosis     Past Surgical History  Procedure Laterality Date  . Cholecystectomy    . Prostate surgery      x2  . Back surgery  2004  . Total knee arthroplasty      Social History:  reports that he has never smoked. He has never used smokeless tobacco. He reports that he does not drink alcohol or use illicit drugs.  Family History:  Family History  Problem Relation Age of Onset  . Diabetes Mother   . Arthritis Mother   . Coronary artery disease Father   . Heart disease Father   . Stroke Neg Hx      Prior to Admission medications   Medication Sig Start Date End Date Taking? Authorizing Provider  acetaminophen (TYLENOL) 500 MG tablet Take 1,000 mg by mouth at bedtime. For fever    Yes Historical Provider, MD  ALPRAZolam (XANAX) 0.25 MG tablet Take 0.25 mg by mouth at bedtime as needed. For anxiety   Yes Historical Provider, MD  Ascorbic Acid (VITAMIN C PO) Take 1,000 mg by  mouth daily.    Yes Historical Provider, MD  aspirin 81 MG tablet Take 81 mg by mouth daily.     Yes Historical Provider, MD  calcium citrate-vitamin D 200-200 MG-UNIT TABS Take 1 tablet by mouth 2 (two) times daily.     Yes Historical Provider, MD  clopidogrel (PLAVIX) 75 MG tablet Take 75 mg by mouth daily.     Yes Historical Provider, MD  dicyclomine (BENTYL) 10 MG capsule Take 10 mg by mouth 3 (three) times daily as needed for spasms.   Yes Historical Provider, MD  diphenhydrAMINE (SOMINEX) 25 MG tablet Take 25 mg by mouth at bedtime as  needed. For allergy   Yes Historical Provider, MD  esomeprazole (NEXIUM) 40 MG capsule Take 40 mg by mouth daily before breakfast.     Yes Historical Provider, MD  mirabegron ER (MYRBETRIQ) 50 MG TB24 tablet Take 50 mg by mouth at bedtime.   Yes Historical Provider, MD  Multiple Vitamin (MULTIVITAMIN WITH MINERALS) TABS tablet Take 1 tablet by mouth daily.   Yes Historical Provider, MD  Polyethyl Glycol-Propyl Glycol (SYSTANE OP) Place 1-2 drops into both eyes 2 (two) times daily.    Yes Historical Provider, MD  Polyethyl Glycol-Propyl Glycol 0.4-0.3 % GEL Apply 1 application to eye at bedtime.   Yes Historical Provider, MD  polyethylene glycol powder (MIRALAX) powder Take 17 g by mouth daily as needed for mild constipation.    Yes Historical Provider, MD  rosuvastatin (CRESTOR) 5 MG tablet Take 5 mg by mouth daily.     Yes Historical Provider, MD  tamsulosin (FLOMAX) 0.4 MG CAPS capsule Take 0.8 mg by mouth daily.   Yes Historical Provider, MD  hyoscyamine (LEVBID) 0.375 MG 12 hr tablet Take 1 tablet (0.375 mg total) by mouth 2 (two) times daily. Patient not taking: Reported on 01/27/2015 08/21/13   Gregory Banister, MD  Linaclotide [facility]) 145 MCG CAPS capsule Take 1 capsule (145 mcg total) by mouth daily. Patient not taking: Reported on 01/27/2015 09/03/13   Gregory Banister, MD  psyllium (METAMUCIL) 58.6 % powder Take 1 packet by mouth 3 (three) times daily.    Historical Provider, MD    Physical Exam: Filed Vitals:   01/27/15 0515 01/27/15 0530 01/27/15 0545 01/27/15 0600  BP: 111/58 103/60 112/56 132/54  Pulse: 70 65 66 67  Temp:      TempSrc:      Resp: 20 20 17 19   SpO2: 96% 95% 94% 98%   General: Not in acute distress HEENT:       Eyes: PERRL, EOMI, no scleral icterus.        ENT: No discharge from the ears and nose, no pharynx injection, no tonsillar enlargement. Poor hearing       Neck: No JVD, no bruit, no mass felt. Cardiac: S1/S2, RRR, No murmurs, No gallops or rubs Pulm:  Clear to auscultation bilaterally. No rales, wheezing, rhonchi or rubs. Abd: Soft, nondistended, nontender, no rebound pain, no organomegaly, BS present Ext: No edema bilaterally. 2+DP/PT pulse bilaterally Musculoskeletal: No joint deformities, erythema, or stiffness, ROM full Skin: No rashes.  Neuro: Alert and oriented X3, cranial nerves II-XII grossly intact except for left facial droop, muscle strength 5/5 extremeties, sensation to light touch intact. Brachial reflex 2+ bilaterally. Knee reflex 1+ bilaterally. Negative Babinski's sign. Normal finger to nose test. Psych: Patient is not psychotic, no suicidal or hemocidal ideation.  Labs on Admission:  Basic Metabolic Panel:  Recent Labs Lab 01/27/15 0021  NA 133*  K 3.6  CL 98  CO2 25  GLUCOSE 166*  BUN 7  CREATININE 1.20  CALCIUM 9.0   Liver Function Tests:  Recent Labs Lab 01/27/15 0021  AST 24  ALT 22  ALKPHOS 71  BILITOT 0.8  PROT 6.4  ALBUMIN 4.3   No results for input(s): LIPASE, AMYLASE in the last 168 hours. No results for input(s): AMMONIA in the last 168 hours. CBC:  Recent Labs Lab 01/27/15 0021  WBC 18.6*  NEUTROABS 15.7*  HGB 12.5*  HCT 36.9*  MCV 93.2  PLT 219   Cardiac Enzymes:  Recent Labs Lab 01/27/15 0021  TROPONINI <0.03    BNP (last 3 results) No results for input(s): BNP in the last 8760 hours.  ProBNP (last 3 results) No results for input(s): PROBNP in the last 8760 hours.  CBG: No results for input(s): GLUCAP in the last 168 hours.  Radiological Exams on Admission: Ct Head Wo Contrast  01/27/2015   CLINICAL DATA:  Weakness.  EXAM: CT HEAD WITHOUT CONTRAST  TECHNIQUE: Contiguous axial images were obtained from the base of the skull through the vertex without intravenous contrast.  COMPARISON:  Brain MRI 09/17/2011 and CT 09/15/2011  FINDINGS: Chronic left parietal infarct is again seen. There is a chronic lacunar infarct in the right caudate which is new. Patchy hypodensities  in the cerebral white matter have slightly progressed from the prior study and are most compatible with chronic small vessel ischemia. There is no definite evidence of acute large territory infarct, intracranial hemorrhage, mass, midline shift, or extra-axial fluid collection. There is mild cerebral atrophy.  Orbits are unremarkable. Visualized mastoid air cells and paranasal sinuses are clear. Mild carotid siphon and vertebral artery calcification is noted.  IMPRESSION: 1. No acute intracranial abnormality identified. 2. Chronic infarcts and chronic small vessel ischemic disease.   Electronically Signed   By: Logan Bores   On: 01/27/2015 05:02   Dg Chest Port 1 View  01/27/2015   CLINICAL DATA:  Initial evaluation for acute weakness.  EXAM: PORTABLE CHEST - 1 VIEW  COMPARISON:  Prior study from 09/15/2011  FINDINGS: The cardiac and mediastinal silhouettes are stable in size and contour, and remain within normal limits. Atheromatous plaque present within the aortic arch.  The lungs are hypoinflated. Patchy and streaky bibasilar opacities favored to reflect atelectasis/bronchovascular crowding. Superimposed patchy infiltrates not entirely excluded, particularly within the retrocardiac left lower lobe. No pulmonary edema or pleural effusion. No pneumothorax.  No acute osseous abnormality.  IMPRESSION: Shallow lung inflation with mild patchy and linear bibasilar opacities, left greater than right. Findings favored to reflect bronchovascular crowding and/or atelectasis. Possible infiltrates not entirely excluded, and could be considered in the correct clinical setting.   Electronically Signed   By: Jeannine Boga M.D.   On: 01/27/2015 03:49    EKG: Independently reviewed.  Abnormal findings:   EKG showed bifascicular AV block which is slightly worse than previous EKG on 09/25/11. QTc interval 474.  Assessment/Plan Principal Problem:   CAP (community acquired pneumonia) Active Problems:   Diabetes  mellitus without complication   Stroke   Arthritis   Carotid artery occlusion   Hyperlipidemia   Hypertension   BPH (benign prostatic hyperplasia)   Sepsis   Community acquired bacterial pneumonia  CAP and sepsis secondary to CAP: Patient has fever, chills, leukocytosis and SOB. Chest x-ray showed patchy opacity bilaterally. It is most consistent with CAP though pt has no cough and CP. Patient is mildly  septic with leukocytosis and fever. He is hemodynamically stable.  - will admit tele bed - IV levaquin started in ED - Urine legionella and S. pneumococcal antigen - Albuterol Nebulizer prn for SOB - Follow up blood culture x2, sputum culture and respiratory virus panel, plus Flu pcr - Hydroxyzine prn for Nausea  --will get Procalcitonin and trend lactic acid level per sepsis protocol - IVF: received 2L of NS bolus in ED, followed by 100 cc/h - check lipase  Stroke: Patient has history of stroke with worsening left-sided weakness and left facial droop. Need to rule out recurrent stroke. - MRI, MRA of the brain without contrast  - PT consult, OT consult, Speech consult  - 2 d Echocardiogram  - continue Aspirin and plavix  Diabetes mellitus: A1c 6.7 on 09/15/11. Patient is not on medications at home.  -sliding scale insulin  Hyperlipidemia: -Continue Crestor  BPH: -Continue Flomax and Mirabegron  Gerd: -Protonix  Anxiety: -When necessary Xanax  DVT ppx: SQ Heparin    Code Status: Full code Family Communication:  Yes, patient's  Son and wife    at bed side Disposition Plan: Admit to inpatient   Date of Service 01/27/2015    Ivor Costa [facility] Pager [phone]  If 7PM-7AM, please contact night-coverage [email or web address] Password [email or web address] 01/27/2015, 6:16 AM

## 2015-01-27 NOTE — Progress Notes (Signed)
RT Note: Pt given specimen cup with instructions to expectorate all secretions into cup. Pt verbally understands, RT to monitor.

## 2015-01-27 NOTE — Progress Notes (Signed)
CARE MANAGEMENT NOTE 01/27/2015  Patient:  Gregory Padilla, Gregory Padilla   Account Number:  [id number]  Date Initiated:  01/27/2015  Documentation initiated by:  Lorne Skeens  Subjective/Objective Assessment:   Patient was admitted with sepsis due to CAP and possible CVA.     Action/Plan:   Will follow for discharge needs pending PT/OT evals and physician orders.   Anticipated DC Date:     Anticipated DC Plan:           Choice offered to / List presented to:             Status of service:   Medicare Important Message given?   (If response is "NO", the following Medicare IM given date fields will be blank) Date Medicare IM given:   Medicare IM given by:   Date Additional Medicare IM given:   Additional Medicare IM given by:    Discharge Disposition:    Per UR Regulation:  Reviewed for med. necessity/level of care/duration of stay  If discussed at [place] of Stay Meetings, dates discussed:    Comments:

## 2015-01-28 ENCOUNTER — Inpatient Hospital Stay (HOSPITAL_COMMUNITY): Payer: Medicare Other

## 2015-01-28 DIAGNOSIS — I6789 Other cerebrovascular disease: Secondary | ICD-10-CM

## 2015-01-28 LAB — CBC
HCT: 35.5 % — ABNORMAL LOW (ref 39.0–52.0)
HEMOGLOBIN: 11.6 g/dL — AB (ref 13.0–17.0)
MCH: 31 pg (ref 26.0–34.0)
MCHC: 32.7 g/dL (ref 30.0–36.0)
MCV: 94.9 fL (ref 78.0–100.0)
Platelets: 203 10*3/uL (ref 150–400)
RBC: 3.74 MIL/uL — ABNORMAL LOW (ref 4.22–5.81)
RDW: 12.2 % (ref 11.5–15.5)
WBC: 8.8 10*3/uL (ref 4.0–10.5)

## 2015-01-28 LAB — BASIC METABOLIC PANEL
Anion gap: 11 (ref 5–15)
BUN: 9 mg/dL (ref 6–23)
CALCIUM: 9 mg/dL (ref 8.4–10.5)
CO2: 20 mmol/L (ref 19–32)
Chloride: 104 mmol/L (ref 96–112)
Creatinine, Ser: 1.34 mg/dL (ref 0.50–1.35)
GFR calc Af Amer: 53 mL/min — ABNORMAL LOW (ref >90)
GFR, EST NON AFRICAN AMERICAN: 46 mL/min — AB (ref >90)
Glucose, Bld: 247 mg/dL — ABNORMAL HIGH (ref 70–99)
Potassium: 4 mmol/L (ref 3.5–5.1)
SODIUM: 135 mmol/L (ref 135–145)

## 2015-01-28 LAB — LIPID PANEL
Cholesterol: 119 mg/dL (ref 0–200)
HDL: 50 mg/dL (ref >39)
LDL Cholesterol: 50 mg/dL (ref 0–99)
Total CHOL/HDL Ratio: 2.4 RATIO
Triglycerides: 97 mg/dL (ref <150)
VLDL: 19 mg/dL (ref 0–40)

## 2015-01-28 LAB — GLUCOSE, CAPILLARY
GLUCOSE-CAPILLARY: 121 mg/dL — AB (ref 70–99)
GLUCOSE-CAPILLARY: 148 mg/dL — AB (ref 70–99)
Glucose-Capillary: 192 mg/dL — ABNORMAL HIGH (ref 70–99)
Glucose-Capillary: 176 mg/dL — ABNORMAL HIGH (ref 70–99)

## 2015-01-28 LAB — LEGIONELLA ANTIGEN, URINE

## 2015-01-28 MED ORDER — ASPIRIN EC 81 MG PO TBEC
81.0000 mg | DELAYED_RELEASE_TABLET | Freq: Every day | ORAL | Status: DC
  Administered 2015-01-28 – 2015-01-29 (×2): 81 mg via ORAL
  Filled 2015-01-28 (×2): qty 1

## 2015-01-28 MED ORDER — MAGNESIUM HYDROXIDE 400 MG/5ML PO SUSP
30.0000 mL | Freq: Every day | ORAL | Status: DC | PRN
  Administered 2015-01-28 (×2): 30 mL via ORAL
  Filled 2015-01-28 (×2): qty 30

## 2015-01-28 MED ORDER — ASPIRIN EC 325 MG PO TBEC: 325.0000 mg | DELAYED_RELEASE_TABLET | Freq: Every day | ORAL | Status: DC

## 2015-01-28 MED ORDER — PERFLUTREN LIPID MICROSPHERE
1.0000 mL | INTRAVENOUS | Status: DC | PRN
  Administered 2015-01-28: 2 mL via INTRAVENOUS
  Filled 2015-01-28: qty 10

## 2015-01-28 NOTE — Progress Notes (Signed)
VASCULAR LAB PRELIMINARY  PRELIMINARY  PRELIMINARY  PRELIMINARY  Carotid duplex completed.    Preliminary report:  Right - 1% to 39% ICA stenosis. Vertebral artery flow is antegrade. Left - 40% to 59% ICA stenosis. Vertebral artery flow is antegrade.   Lenoard Helbert, RVS 01/28/2015, 4:21 PM

## 2015-01-28 NOTE — Progress Notes (Signed)
PATIENT DETAILS Name: Gregory Padilla Age: 81 y.o. Sex: male Date of Birth: 24-Mar-1925 Admit Date: 01/26/2015 Admitting Physician Ivor Costa, MD [id number] Lysbeth Galas, MD  Subjective: Feels much better, no longer complaining of weakness  Assessment/Plan: Principal Problem:   CAP (community acquired pneumonia): Significantly improved his morning. He remains afebrile. Leukocytosis has resolved. Blood Cultures negative, influenza PCR negative, urine streptococcal pneumonia antigen negative. Continue with levofloxacin. Will need repeat chest x-ray in 6-8 weeks.  Active Problems:   Acute CVA: Admitted with some mild left-sided, MRI of the brain shows a small white matter infarct in the right occipital lobe. Hardly any deficits on exam. Continue both aspirin and Plavix. Await 2-D echocardiogram, recently had a negative carotid Doppler on 07/24/14-discussed with Dr. Marquette Old need to repeat. LDL at goal-at 50-continue Crestor. A1c pending. Await physical therapy evaluation, and further recommendations from the stroke team    Intermittent dysphagia: Mostly to liquids. Will check a barium esophagogram. If no major abnormalities, further workup will be deferred to the outpatient setting    Diabetes mellitus without complication: CBGs controlled, not on any anti-diabetic medications at home. Continue with SSI. Await A1c    Dyslipidemia: LDL at goal-continue Crestor    BPH: Continue Flomax    GERD: Continue Protonix    Anxiety: Continue prn Xanax  Disposition: Remain inpatient  Antimicrobial agents  See below  Anti-infectives    Start     Dose/Rate Route Frequency Ordered Stop   01/29/15 0600  levofloxacin (LEVAQUIN) IVPB 750 mg    Comments:  Levaquin 750 mg IV q48h for CrCL < 50 mL/min   750 mg 100 mL/hr over 90 Minutes Intravenous Every 48 hours 01/27/15 0638     01/27/15 0615  levofloxacin (LEVAQUIN) IVPB 750 mg  Status:  Discontinued     750 mg 100 mL/hr  over 90 Minutes Intravenous Every 24 hours 01/27/15 0606 01/27/15 0638   01/27/15 0515  levofloxacin (LEVAQUIN) IVPB 750 mg     750 mg 100 mL/hr over 90 Minutes Intravenous  Once 01/27/15 0514 01/27/15 0701      DVT Prophylaxis: Prophylactic  Heparin   Code Status: Full code  Family Communication None at bedside   Procedures: None   CONSULTS:  neurology  Time spent 40 minutes-which includes 50% of the time with face-to-face with patient/ family and neurologist-Dr. Leonie Man - coordinating care related to the above assessment and plan.  MEDICATIONS: Scheduled Meds: . artificial tears   Both Eyes QHS  . aspirin EC  81 mg Oral Daily  . calcium citrate  200 mg of elemental calcium Oral BID   And  . cholecalciferol  400 Units Oral BID  . clopidogrel  75 mg Oral Daily  . heparin  5,000 Units Subcutaneous 3 times per day  . insulin aspart  0-9 Units Subcutaneous TID WC  . [START ON 01/29/2015] levofloxacin (LEVAQUIN) IV  750 mg Intravenous Q48H  . mirabegron ER  50 mg Oral QHS  . multivitamin with minerals  1 tablet Oral Daily  . pantoprazole  40 mg Oral Daily  . psyllium  1 packet Oral Daily  . rosuvastatin  5 mg Oral Daily  . tamsulosin  0.8 mg Oral Daily  . vitamin C  1,000 mg Oral Daily   Continuous Infusions: . sodium chloride 100 mL/hr at 01/27/15 2149   PRN Meds:.acetaminophen, albuterol, ALPRAZolam, dicyclomine, diphenhydrAMINE, hydrOXYzine, polyethylene glycol  PHYSICAL EXAM: Vital signs in last 24 hours: Filed Vitals:   01/28/15 0217 01/28/15 0610 01/28/15 0933 01/28/15 0937  BP: 143/62 162/63 118/60 124/60  Pulse: 64 60    Temp: 98 F (36.7 C) 98 F (36.7 C)    TempSrc: Oral Oral    Resp: 19 19 18 16   Height:      Weight:      SpO2: 96% 97%      Weight change:  Filed Weights   01/27/15 2100  Weight: 79.9 kg (176 lb 2.4 oz)   Body mass index is 26.79 kg/(m^2).   Gen Exam: Awake and alert with clear speech.   Neck: Supple, No JVD.   Chest:  B/L Clear.   CVS: S1 S2 Regular, no murmurs.  Abdomen: soft, BS +, non tender, non distended.  Extremities: no edema, lower extremities warm to touch. Neurologic: Non Focal.   Skin: No Rash.   Wounds: N/A.   Intake/Output from previous day:  Intake/Output Summary (Last 24 hours) at 01/28/15 1144 Last data filed at 01/28/15 0705  Gross per 24 hour  Intake    120 ml  Output   1650 ml  Net  -1530 ml     LAB RESULTS: CBC  Recent Labs Lab 01/27/15 0021 01/28/15 0800  WBC 18.6* 8.8  HGB 12.5* 11.6*  HCT 36.9* 35.5*  PLT 219 203  MCV 93.2 94.9  MCH 31.6 31.0  MCHC 33.9 32.7  RDW 11.9 12.2  LYMPHSABS 0.8  --   MONOABS 2.1*  --   EOSABS 0.0  --   BASOSABS 0.0  --     Chemistries   Recent Labs Lab 01/27/15 0021 01/28/15 0800  NA 133* 135  K 3.6 4.0  CL 98 104  CO2 25 20  GLUCOSE 166* 247*  BUN 7 9  CREATININE 1.20 1.34  CALCIUM 9.0 9.0    CBG:  Recent Labs Lab 01/27/15 1649 01/27/15 2249 01/28/15 0630 01/28/15 1115  GLUCAP 130* 139* 121* 192*    GFR Estimated Creatinine Clearance: 37.6 mL/min (by C-G formula based on Cr of 1.34).  Coagulation profile  Recent Labs Lab 01/27/15 1155  INR 1.17    Cardiac Enzymes  Recent Labs Lab 01/27/15 0021  TROPONINI <0.03    Invalid input(s): POCBNP No results for input(s): DDIMER in the last 72 hours. No results for input(s): HGBA1C in the last 72 hours.  Recent Labs  01/28/15 0436  CHOL 119  HDL 50  LDLCALC 50  TRIG 97  CHOLHDL 2.4   No results for input(s): TSH, T4TOTAL, T3FREE, THYROIDAB in the last 72 hours.  Invalid input(s): FREET3 No results for input(s): VITAMINB12, FOLATE, FERRITIN, TIBC, IRON, RETICCTPCT in the last 72 hours.  Recent Labs  01/26/15 0021  LIPASE 41    Urine Studies No results for input(s): UHGB, CRYS in the last 72 hours.  Invalid input(s): UACOL, UAPR, USPG, UPH, UTP, UGL, UKET, UBIL, UNIT, UROB, ULEU, UEPI, UWBC, URBC, UBAC, CAST, UCOM,  BILUA  MICROBIOLOGY: Recent Results (from the past 240 hour(s))  Culture, blood (routine x 2) Call MD if unable to obtain prior to antibiotics being given     Status: None (Preliminary result)   Collection Time: 01/27/15 11:57 AM  Result Value Ref Range Status   Specimen Description BLOOD RIGHT ARM  Final   Special Requests BOTTLES DRAWN AEROBIC AND ANAEROBIC [place]  Final   Culture   Final  BLOOD CULTURE RECEIVED NO GROWTH TO DATE CULTURE WILL BE HELD FOR 5 DAYS BEFORE ISSUING A FINAL NEGATIVE REPORT Performed at [facility]    Report Status PENDING  Incomplete  Culture, blood (routine x 2) Call MD if unable to obtain prior to antibiotics being given     Status: None (Preliminary result)   Collection Time: 01/27/15 12:00 PM  Result Value Ref Range Status   Specimen Description BLOOD RIGHT HAND  Final   Special Requests BOTTLES DRAWN AEROBIC ONLY 2CC  Final   Culture   Final           BLOOD CULTURE RECEIVED NO GROWTH TO DATE CULTURE WILL BE HELD FOR 5 DAYS BEFORE ISSUING A FINAL NEGATIVE REPORT Performed at [facility]    Report Status PENDING  Incomplete  Culture, sputum-assessment     Status: None   Collection Time: 01/27/15  3:52 PM  Result Value Ref Range Status   Specimen Description SPUTUM  Final   Special Requests NONE  Final   Sputum evaluation   Final    MICROSCOPIC FINDINGS SUGGEST THAT THIS SPECIMEN IS NOT REPRESENTATIVE OF LOWER RESPIRATORY SECRETIONS. PLEASE RECOLLECT. Charlynn Court 04.19.16 [place]    Report Status 01/27/2015 FINAL  Final    RADIOLOGY STUDIES/RESULTS: Ct Head Wo Contrast  01/27/2015   CLINICAL DATA:  Weakness.  EXAM: CT HEAD WITHOUT CONTRAST  TECHNIQUE: Contiguous axial images were obtained from the base of the skull through the vertex without intravenous contrast.  COMPARISON:  Brain MRI 09/17/2011 and CT 09/15/2011  FINDINGS: Chronic left parietal infarct is again seen. There is a chronic lacunar  infarct in the right caudate which is new. Patchy hypodensities in the cerebral white matter have slightly progressed from the prior study and are most compatible with chronic small vessel ischemia. There is no definite evidence of acute large territory infarct, intracranial hemorrhage, mass, midline shift, or extra-axial fluid collection. There is mild cerebral atrophy.  Orbits are unremarkable. Visualized mastoid air cells and paranasal sinuses are clear. Mild carotid siphon and vertebral artery calcification is noted.  IMPRESSION: 1. No acute intracranial abnormality identified. 2. Chronic infarcts and chronic small vessel ischemic disease.   Electronically Signed   By: Logan Bores   On: 01/27/2015 05:02   Mr Brain Wo Contrast  01/27/2015   CLINICAL DATA:  81 year old male with left side weakness and facial droop, with presentation including low-grade fever, leukocytosis. Initial encounter.  EXAM: MRI HEAD WITHOUT CONTRAST  MRA HEAD WITHOUT CONTRAST  TECHNIQUE: Multiplanar, multiecho pulse sequences of the brain and surrounding structures were obtained without intravenous contrast. Angiographic images of the head were obtained using MRA technique without contrast.  COMPARISON:  Head CT without contrast 0114 hours today. Brain MRI and MRA 09/17/2011.  FINDINGS: MRI HEAD FINDINGS  Mild generalized cerebral volume loss since 2012. Major intracranial vascular flow voids are stable.  Chronic left parietal lobe infarct with encephalomalacia. Patchy and confluent bilateral cerebral white matter T2 and FLAIR hyperintensity. Chronic deep white matter capsule T2 and FLAIR hyperintensity on the right. Patchy signal hyperintensity in the pons is chronic. Chronic but increased encephalomalacia due to small infarcts in the inferior left cerebellum.  Subtle white matter restricted diffusion in the right occipital lobe best seen on series 6, image 4, also on series 4, image 22). No other convincing restricted diffusion.  No  midline shift, mass effect, evidence of mass lesion, ventriculomegaly, extra-axial collection or acute intracranial hemorrhage. Cervicomedullary junction and  pituitary are within normal limits.  Multilevel mild to moderate degenerative cervical spinal stenosis is evident on series 3, image 12 and appears progressed since 2012.  Visible internal auditory structures appear normal. Trace paranasal sinus mucosal thickening. Visualized orbit soft tissues are within normal limits. Visualized scalp soft tissues are within normal limits.  MRA HEAD FINDINGS  Stable antegrade flow in the posterior circulation with dominant distal right vertebral artery. Normal PICA origins and vertebrobasilar junction. No basilar artery stenosis. SCA and PCA origins remain normal. Mild chronic left P1 segment stenosis is stable. There is a new high-grade stenosis of the a distal right P2 segments seen on series 804, image 18, congruent with the diffusion findings today. Stable left PCA branches.  Stable antegrade flow in both ICA siphons. Mild left supra clinoid ICA stenosis may be increased. Both carotid termini remain patent. Mild irregularity at the right ACA origin is unchanged. MCA and left ACA origins remain normal. Anterior communicating artery and bilateral visualized ACA branches are stable and within normal limits. Visualized bilateral MCA branches are stable and within normal limits.  IMPRESSION: 1. Small acute white matter infarct in the right occipital lobe, associated with new high-grade stenosis of the right PCA distal P2 segment. No mass effect or hemorrhage. 2. Otherwise stable chronic ischemic disease. 3. Otherwise stable intracranial MRA with left supraclinoid ICA and left PCA mild stenoses.   Electronically Signed   By: Genevie Ann M.D.   On: 01/27/2015 11:57   Dg Chest Port 1 View  01/27/2015   CLINICAL DATA:  Initial evaluation for acute weakness.  EXAM: PORTABLE CHEST - 1 VIEW  COMPARISON:  Prior study from 09/15/2011   FINDINGS: The cardiac and mediastinal silhouettes are stable in size and contour, and remain within normal limits. Atheromatous plaque present within the aortic arch.  The lungs are hypoinflated. Patchy and streaky bibasilar opacities favored to reflect atelectasis/bronchovascular crowding. Superimposed patchy infiltrates not entirely excluded, particularly within the retrocardiac left lower lobe. No pulmonary edema or pleural effusion. No pneumothorax.  No acute osseous abnormality.  IMPRESSION: Shallow lung inflation with mild patchy and linear bibasilar opacities, left greater than right. Findings favored to reflect bronchovascular crowding and/or atelectasis. Possible infiltrates not entirely excluded, and could be considered in the correct clinical setting.   Electronically Signed   By: Jeannine Boga M.D.   On: 01/27/2015 03:49   Mr Jodene Nam Head/brain Wo Cm  01/27/2015   CLINICAL DATA:  81 year old male with left side weakness and facial droop, with presentation including low-grade fever, leukocytosis. Initial encounter.  EXAM: MRI HEAD WITHOUT CONTRAST  MRA HEAD WITHOUT CONTRAST  TECHNIQUE: Multiplanar, multiecho pulse sequences of the brain and surrounding structures were obtained without intravenous contrast. Angiographic images of the head were obtained using MRA technique without contrast.  COMPARISON:  Head CT without contrast 0114 hours today. Brain MRI and MRA 09/17/2011.  FINDINGS: MRI HEAD FINDINGS  Mild generalized cerebral volume loss since 2012. Major intracranial vascular flow voids are stable.  Chronic left parietal lobe infarct with encephalomalacia. Patchy and confluent bilateral cerebral white matter T2 and FLAIR hyperintensity. Chronic deep white matter capsule T2 and FLAIR hyperintensity on the right. Patchy signal hyperintensity in the pons is chronic. Chronic but increased encephalomalacia due to small infarcts in the inferior left cerebellum.  Subtle white matter restricted  diffusion in the right occipital lobe best seen on series 6, image 4, also on series 4, image 22). No other convincing restricted diffusion.  No midline shift, mass  effect, evidence of mass lesion, ventriculomegaly, extra-axial collection or acute intracranial hemorrhage. Cervicomedullary junction and pituitary are within normal limits.  Multilevel mild to moderate degenerative cervical spinal stenosis is evident on series 3, image 12 and appears progressed since 2012.  Visible internal auditory structures appear normal. Trace paranasal sinus mucosal thickening. Visualized orbit soft tissues are within normal limits. Visualized scalp soft tissues are within normal limits.  MRA HEAD FINDINGS  Stable antegrade flow in the posterior circulation with dominant distal right vertebral artery. Normal PICA origins and vertebrobasilar junction. No basilar artery stenosis. SCA and PCA origins remain normal. Mild chronic left P1 segment stenosis is stable. There is a new high-grade stenosis of the a distal right P2 segments seen on series 804, image 18, congruent with the diffusion findings today. Stable left PCA branches.  Stable antegrade flow in both ICA siphons. Mild left supra clinoid ICA stenosis may be increased. Both carotid termini remain patent. Mild irregularity at the right ACA origin is unchanged. MCA and left ACA origins remain normal. Anterior communicating artery and bilateral visualized ACA branches are stable and within normal limits. Visualized bilateral MCA branches are stable and within normal limits.  IMPRESSION: 1. Small acute white matter infarct in the right occipital lobe, associated with new high-grade stenosis of the right PCA distal P2 segment. No mass effect or hemorrhage. 2. Otherwise stable chronic ischemic disease. 3. Otherwise stable intracranial MRA with left supraclinoid ICA and left PCA mild stenoses.   Electronically Signed   By: Genevie Ann M.D.   On: 01/27/2015 11:57    Oren Binet,  MD  [facility] Pager:[phone]  If 7PM-7AM, please contact night-coverage [email or web address] Password [email or web address] 01/28/2015, 11:44 AM   LOS: 1 day

## 2015-01-28 NOTE — OR Nursing (Addendum)
Definity study done with 2cc IVP.  Patient comfortable, denies CP or back pain

## 2015-01-28 NOTE — Progress Notes (Signed)
STROKE TEAM PROGRESS NOTE   HISTORY Gregory Padilla is an 81 y.o. male presenting to hospital with left sided weakness in setting of low grade fever, diarrhea, N/V, and elevated WBC. patient reports he has had a stroke in the past with residual left sided weakness. Per family facial droop lasted for about 2 hours. Chest x-ray showed bilateral patchy opacity (L>R). WBC 18.6, temperature 100.7, heart rate 61, negative urinalysis, negative troponin. EKG showed bifascicular AV block which is slightly worse than previous EKG on 09/25/11. Neurology asked to assess for possible CVA or TIA. He was last known well 01/26/2015, time unable to be determined. Modified Rankin: Rankin Score=0. Patient was not administered TPA secondary to delay in arrival. He was admitted for further evaluation and treatment.   SUBJECTIVE (INTERVAL HISTORY) His RN is at the bedside.  Overall he feels his condition is rapidly improving. Patient presented with fever and generalized weakness with known prior history of strokes and mild neurological deficits which likely were exacerbated. He denies any vision loss or significant headache   OBJECTIVE Temp:  [97.5 F (36.4 C)-98.8 F (37.1 C)] 98 F (36.7 C) (04/20 0610) Pulse Rate:  [57-72] 60 (04/20 0610) Cardiac Rhythm:  [-] Normal sinus rhythm (04/19 2018) Resp:  [16-20] 16 (04/20 0937) BP: (108-162)/(44-80) 124/60 mmHg (04/20 0937) SpO2:  [96 %-100 %] 97 % (04/20 0610) Weight:  [79.9 kg (176 lb 2.4 oz)] 79.9 kg (176 lb 2.4 oz) (04/19 2100)   Recent Labs Lab 01/27/15 1649 01/27/15 2249 01/28/15 0630  GLUCAP 130* 139* 121*    Recent Labs Lab 01/27/15 0021 01/28/15 0800  NA 133* 135  K 3.6 4.0  CL 98 104  CO2 25 20  GLUCOSE 166* 247*  BUN 7 9  CREATININE 1.20 1.34  CALCIUM 9.0 9.0    Recent Labs Lab 01/27/15 0021  AST 24  ALT 22  ALKPHOS 71  BILITOT 0.8  PROT 6.4  ALBUMIN 4.3    Recent Labs Lab 01/27/15 0021 01/28/15 0800  WBC 18.6* 8.8   NEUTROABS 15.7*  --   HGB 12.5* 11.6*  HCT 36.9* 35.5*  MCV 93.2 94.9  PLT 219 203    Recent Labs Lab 01/27/15 0021  TROPONINI <0.03    Recent Labs  01/27/15 1155  LABPROT 15.0  INR 1.17    Recent Labs  01/27/15 0233  COLORURINE YELLOW  LABSPEC 1.021  PHURINE 6.0  GLUCOSEU NEGATIVE  HGBUR NEGATIVE  BILIRUBINUR NEGATIVE  KETONESUR NEGATIVE  PROTEINUR NEGATIVE  UROBILINOGEN 1.0  NITRITE NEGATIVE  LEUKOCYTESUR NEGATIVE       Component Value Date/Time   CHOL 119 01/28/2015 0436   TRIG 97 01/28/2015 0436   HDL 50 01/28/2015 0436   CHOLHDL 2.4 01/28/2015 0436   VLDL 19 01/28/2015 0436   LDLCALC 50 01/28/2015 0436   Lab Results  Component Value Date   HGBA1C 6.7* 09/15/2011   No results found for: LABOPIA, COCAINSCRNUR, LABBENZ, AMPHETMU, THCU, LABBARB  No results for input(s): ETH in the last 168 hours.   Ct Head Wo Contrast 01/27/2015   1. No acute intracranial abnormality identified. 2. Chronic infarcts and chronic small vessel ischemic disease.     Mr Brain Wo Contrast 01/27/2015    1. Small acute white matter infarct in the right occipital lobe, associated with new high-grade stenosis of the right PCA distal P2 segment. No mass effect or hemorrhage. 2. Otherwise stable chronic ischemic disease.   Mr Jodene Nam Head/brain Wo Cm 01/27/2015   3.  Otherwise stable intracranial MRA with left supraclinoid ICA and left PCA mild stenoses.     Dg Chest Port 1 View 01/27/2015   Shallow lung inflation with mild patchy and linear bibasilar opacities, left greater than right. Findings favored to reflect bronchovascular crowding and/or atelectasis. Possible infiltrates not entirely excluded, and could be considered in the correct clinical setting.     2D Echocardiogram   - Left ventricle: The cavity size was normal. Wall thickness wasincreased in a pattern of mild LVH. Systolic function was normal.The estimated ejection fraction was in the range of 60% to 65%.Wall motion was  normal; there were no regional wall motionabnormalities. Doppler parameters are consistent with abnormalleft ventricular relaxation (grade 1 diastolic dysfunction). - Aortic valve: There was moderate stenosis. There was mildregurgitation. Valve area (VTI): 1.08 cm^2. Valve area (Vmax):0.94 cm^2. Valve area (Vmean): 0.97 cm^2. - Mitral valve: Mildly calcified annulus.  Carotid Doppler  07/25/2014 There is < 40% bilateral ICA stenosis. Vertebral artery flow is antegrade.     PHYSICAL EXAM frail elderly Caucasian male currently not in distress. . Afebrile. Head is nontraumatic. Neck is supple without bruit.    Cardiac exam no murmur or gallop. Lungs are clear to auscultation. Distal pulses are well felt. Neurological Exam :  Awake alert oriented x 3 normal speech and language. Mild left lower face asymmetry. Tongue midline. No drift. Mild diminished fine finger movements on left. Orbits right over left upper extremity. Mild left grip weak.. Mild subjective diminished right leg sensation . Normal coordination. Plantars downgoing. Gait was not tested. ASSESSMENT/PLAN Gregory Padilla is a 81 y.o. male with history of hypertension, hyperlipidemia, diet-controlled diabetes, GERD, stroke with left-sided weakness, BPH, history of skin cancer, history of arthritis, and history of carotid artery stenosis presenting with fever, chills, left-sided weakness and facial droop. He did not receive IV t-PA due to unknown last known well.   Stroke:  Incidental R occipital infarct not associated with presenting symptoms. Symptoms most likely recrudesce of previous R brain stroke in setting of pneumonia  Resultant  Neuro deficits resolved; back to baseline  MRI  Small R occipital infarct  MRA  R PCA stenosis  Carotid Doppler  No significant stenosis on carotids done 6 mos ago  2D Echo  No source of embolus   Heparin 5000 units sq tid for VTE prophylaxis Diet NPO time specified  aspirin 81 mg orally  every day and clopidogrel 75 mg orally every day prior to admission, now on aspirin 81 mg orally every day and clopidogrel 75 mg orally every day  Ongoing aggressive stroke risk factor management  Therapy recommendations:  pending   Disposition:  Anticipate return home  Hypertension  Stable  Hyperlipidemia  Home meds:  crestor 5, resumed in hospital  LDL 50, goal < 70  Continue statin at discharge  Diabetes  HgbA1c pending , goal < 7.0  Other Stroke Risk Factors  Advanced age  Hx stroke/TIA - hx stroke  with resultant L hemiparesis, admitted 09/2011 when seen by stroke team, MRI neg for stroke, 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 Calcified lesion. Present for years per patient. Will check [facility] day # [place] [facility] for Pager information 01/28/2015 3:07 PM  I have personally examined this  patient, reviewed notes, independently viewed imaging studies, participated in medical decision making and plan of care. I have made any additions or clarifications directly to the above note. Agree with note above. She presented with worsening of left hemiparesis in the setting of fever and systemic illness but MRI scan shows a tiny right occipital infarct which is incidental and unlikely explains her presentation. She however remains at risk for recurrent stroke/TIA neurological worsening and recommend ongoing evaluation and aggressive risk factor modification. Antony Contras, MD Medical Director [facility] Pager: [phone] 01/28/2015 3:31 PM    To contact Stroke Continuity provider, please refer to [email or web address] After hours, contact General Neurology

## 2015-01-28 NOTE — Evaluation (Signed)
Physical Therapy Evaluation Patient Details Name: Gregory Padilla MRN: [id number] DOB: 11-21-1924 Today's Date: 01/28/2015   History of Present Illness  81 y.o. male admitted with CAP (community acquired pneumonia) and acute CVA.   Clinical Impression  Pt admitted with above complications. Pt currently with functional limitations due to the deficits listed below (see PT Problem List). Ambulates generally well with reports of unsteadiness during head turns, especially when looking up. Reports having several falls at home in the past six months due to turning too quickly and losing balance. Today Gregory Padilla moves well while using a rolling walker for support. He has 24 hour supervision at home and agrees he would benefit from follow up therapy at outpatient clinic. Will follow until d/c to further increase their independence and safety with mobility to allow discharge to the venue listed below.       Follow Up Recommendations Outpatient PT (Outpatient Neuro clinic)    Equipment Recommendations  None recommended by PT    Recommendations for Other Services       Precautions / Restrictions Precautions Precautions: Fall Restrictions Weight Bearing Restrictions: No      Mobility  Bed Mobility Overal bed mobility: Modified Independent                Transfers Overall transfer level: Needs assistance Equipment used: Rolling walker (2 wheeled) Transfers: Sit to/from Stand Sit to Stand: Supervision         General transfer comment: supervision for safety. Needs cues for hand placement to rise and to reach for seat prior to sitting. Performed from bed and toilet.  Ambulation/Gait Ambulation/Gait assistance: Supervision Ambulation Distance (Feet): 350 Feet Assistive device: Rolling walker (2 wheeled) Gait Pattern/deviations: Step-through pattern;Decreased stride length;Trunk flexed;Drifts right/left   Gait velocity interpretation: at or above normal speed for  age/gender General Gait Details: Educated on safe DME use with a rolling walker. VC for walker placement for proximity. Supervision for safety. Reports feeling unstable with head turns, especially looking up. Mild sway noted at times but able to self correct.  Stairs            Wheelchair Mobility    Modified Rankin (Stroke Patients Only) Modified Rankin (Stroke Patients Only) Pre-Morbid Rankin Score: Moderate disability Modified Rankin: Moderately severe disability     Balance Overall balance assessment: Needs assistance;History of Falls Sitting-balance support: No upper extremity supported;Feet supported Sitting balance-Leahy Scale: Good     Standing balance support: No upper extremity supported Standing balance-Leahy Scale: Fair                               Pertinent Vitals/Pain Pain Assessment: No/denies pain    Home Living Family/patient expects to be discharged to:: Private residence Living Arrangements: Spouse/significant other Available Help at Discharge: Family;Available 24 hours/day Type of Home: House Home Access: Ramped entrance     Home Layout: One level Home Equipment: Cane - single point;Walker - 2 wheels;Bedside commode;Shower seat      Prior Function Level of Independence: Independent with assistive device(s)         Comments: uses cane in house, RW at night if going to bathroom.     Hand Dominance   Dominant Hand: Right    Extremity/Trunk Assessment   Upper Extremity Assessment: Defer to OT evaluation           Lower Extremity Assessment: RLE deficits/detail;LLE deficits/detail RLE Deficits / Details: Reports numbness in plantar surface  of feet BIL, long standing. LLE Deficits / Details: Reports numbness in plantar surface of feet BIL, long standing.     Communication   Communication: No difficulties  Cognition Arousal/Alertness: Awake/alert Behavior During Therapy: WFL for tasks assessed/performed Overall  Cognitive Status: Within Functional Limits for tasks assessed                      General Comments General comments (skin integrity, edema, etc.): Wife and son present during evaluation. Very supportive family. Discussed d/c options and follow up care for unsteadiness.    Exercises        Assessment/Plan    PT Assessment Patient needs continued PT services  PT Diagnosis Difficulty walking;Abnormality of gait   PT Problem List Decreased strength;Decreased activity tolerance;Decreased balance;Decreased mobility;Decreased knowledge of use of DME  PT Treatment Interventions DME instruction;Gait training;Functional mobility training;Therapeutic activities;Therapeutic exercise;Balance training;Neuromuscular re-education;Patient/family education   PT Goals (Current goals can be found in the Care Plan section) Acute Rehab PT Goals Patient Stated Goal: Go home PT Goal Formulation: With patient/family Time For Goal Achievement: 02/11/15 Potential to Achieve Goals: Good    Frequency Min 4X/week   Barriers to discharge        Co-evaluation               End of Session Equipment Utilized During Treatment: Gait belt Activity Tolerance: Patient tolerated treatment well Patient left: in bed;with call bell/phone within reach;with bed alarm set;with family/visitor present Nurse Communication: Mobility status         Time: 9450-3888 PT Time Calculation (min) (ACUTE ONLY): 34 min   Charges:   PT Evaluation $Initial PT Evaluation Tier I: 1 Procedure PT Treatments $Gait Training: 8-22 mins   PT G CodesEllouise Newer 01/28/2015, 6:37 PM Camille Bal [place], [place]

## 2015-01-28 NOTE — Progress Notes (Addendum)
  Echocardiogram 2D Echocardiogram with Definity has been performed.  Diamond Nickel 01/28/2015, 9:48 AM

## 2015-01-29 ENCOUNTER — Inpatient Hospital Stay (HOSPITAL_COMMUNITY): Payer: Medicare Other

## 2015-01-29 LAB — RESPIRATORY VIRUS PANEL
ADENOVIRUS: NEGATIVE
Influenza A: NEGATIVE
Influenza B: NEGATIVE
METAPNEUMOVIRUS: NEGATIVE
PARAINFLUENZA 3 A: NEGATIVE
Parainfluenza 1: NEGATIVE
Parainfluenza 2: NEGATIVE
RESPIRATORY SYNCYTIAL VIRUS A: NEGATIVE
RHINOVIRUS: NEGATIVE
Respiratory Syncytial Virus B: NEGATIVE

## 2015-01-29 LAB — GLUCOSE, CAPILLARY
GLUCOSE-CAPILLARY: 144 mg/dL — AB (ref 70–99)
Glucose-Capillary: 132 mg/dL — ABNORMAL HIGH (ref 70–99)

## 2015-01-29 LAB — HEMOGLOBIN A1C
Hgb A1c MFr Bld: 7.7 % — ABNORMAL HIGH (ref 4.8–5.6)
MEAN PLASMA GLUCOSE: 174 mg/dL

## 2015-01-29 MED ORDER — METFORMIN HCL 500 MG PO TABS: 500.0000 mg | ORAL_TABLET | Freq: Two times a day (BID) | ORAL | Status: DC

## 2015-01-29 MED ORDER — LEVOFLOXACIN 750 MG PO TABS: 750.0000 mg | ORAL_TABLET | Freq: Every day | ORAL | Status: DC

## 2015-01-29 NOTE — Discharge Summary (Addendum)
PATIENT DETAILS Name: Gregory Padilla Age: 81 y.o. Sex: male Date of Birth: 02-21-1925 MRN: [id number]. Admitting Physician: Ivor Costa, MD [id number] Gregory Galas, MD  Admit Date: 01/26/2015 Discharge date: 01/29/2015  Recommendations for Outpatient Follow-up:  1. New medication-metformin (hemoglobin A1c 7.7) 2. Continued aggressive risk factor modification-recurrent CVA. 3. Please repeat chest x-ray in 6-8 weeks to document resolution of pneumonia 4. Please refer to gastroenterology-patient complains of intermittent liquid dysphagia-ability of esophageal gram was negative for obstruction 5. Please do surveillance carotid Doppler-has 40-59% stenosis on left side 6. 2-D echocardiogram demonstrated moderate aortic stenosis-please refer to cardiology as an outpatient. Please also do surveillance/periodic echocardiograms  PRIMARY DISCHARGE DIAGNOSIS:  Principal Problem:   CAP (community acquired pneumonia) Active Problems:   Diabetes mellitus without complication   Stroke   Arthritis   Carotid artery occlusion   Hyperlipidemia   Hypertension   BPH (benign prostatic hyperplasia)   Sepsis   Community acquired bacterial pneumonia      PAST MEDICAL HISTORY: Past Medical History  Diagnosis Date  . Diabetes mellitus age 72    no insulin  . Stroke   . Arthritis   . Carotid artery occlusion   . CVA (cerebral vascular accident)     left sided weakness  . Hyperlipidemia   . Hypertension   . Meniere disease   . Degenerative joint disease   . BPH (benign prostatic hyperplasia)   . Skin cancer     history of skin cancer  . Coccidioidomycosis     DISCHARGE MEDICATIONS: Current Discharge Medication List    START taking these medications   Details  levofloxacin (LEVAQUIN) 750 MG tablet Take 1 tablet (750 mg total) by mouth daily. Qty: 4 tablet, Refills: 0    metFORMIN (GLUCOPHAGE) 500 MG tablet Take 1 tablet (500 mg total) by mouth 2 (two) times daily with a  meal. Qty: 60 tablet, Refills: 0      CONTINUE these medications which have NOT CHANGED   Details  acetaminophen (TYLENOL) 500 MG tablet Take 1,000 mg by mouth at bedtime. For fever     ALPRAZolam (XANAX) 0.25 MG tablet Take 0.25 mg by mouth at bedtime as needed. For anxiety    Ascorbic Acid (VITAMIN C PO) Take 1,000 mg by mouth daily.     aspirin 81 MG tablet Take 81 mg by mouth daily.      calcium citrate-vitamin D 200-200 MG-UNIT TABS Take 1 tablet by mouth 2 (two) times daily.      clopidogrel (PLAVIX) 75 MG tablet Take 75 mg by mouth daily.      dicyclomine (BENTYL) 10 MG capsule Take 10 mg by mouth 3 (three) times daily as needed for spasms.    diphenhydrAMINE (SOMINEX) 25 MG tablet Take 25 mg by mouth at bedtime as needed. For allergy    esomeprazole (NEXIUM) 40 MG capsule Take 40 mg by mouth daily before breakfast.      mirabegron ER (MYRBETRIQ) 50 MG TB24 tablet Take 50 mg by mouth at bedtime.    Multiple Vitamin (MULTIVITAMIN WITH MINERALS) TABS tablet Take 1 tablet by mouth daily.    !! Polyethyl Glycol-Propyl Glycol (SYSTANE OP) Place 1-2 drops into both eyes 2 (two) times daily.     !! Polyethyl Glycol-Propyl Glycol 0.4-0.3 % GEL Apply 1 application to eye at bedtime.    polyethylene glycol powder (MIRALAX) powder Take 17 g by mouth daily as needed for mild constipation.     rosuvastatin (CRESTOR) 5 MG tablet  Take 5 mg by mouth daily.      tamsulosin (FLOMAX) 0.4 MG CAPS capsule Take 0.8 mg by mouth daily.    hyoscyamine (LEVBID) 0.375 MG 12 hr tablet Take 1 tablet (0.375 mg total) by mouth 2 (two) times daily. Qty: 60 tablet, Refills: 4    Linaclotide (LINZESS) 145 MCG CAPS capsule Take 1 capsule (145 mcg total) by mouth daily. Qty: 120 capsule, Refills: 0    psyllium (METAMUCIL) 58.6 % powder Take 1 packet by mouth 3 (three) times daily.     !! - Potential duplicate medications found. Please discuss with provider.      ALLERGIES:   Allergies   Allergen Reactions  . Amitriptyline     unknown    BRIEF HPI:  See H&P, Labs, Consult and Test reports for all details in brief, patient is a 81 year old male with history of hypertension, dyslipidemia, diabetes, gastroesophageal reflux disease who was admitted with fever, and mild left-sided weakness. Patient was subsequently admitted for further evaluation and treatment  CONSULTATIONS:   neurology  PERTINENT RADIOLOGIC STUDIES: Dg Forearm Right  01/28/2015   CLINICAL DATA:  81 year old male with a history of vascular injury to the right arm  EXAM: RIGHT FOREARM - 2 VIEW  COMPARISON:  07/02/2005  FINDINGS: No acute bony abnormality. Soft tissue swelling in the volar soft tissues of the proximal forearm, of uncertain significance. Degenerative changes at the wrist.  IMPRESSION: No acute bony abnormality identified.  Soft tissue swelling in the volar soft tissues of the proximal forearm, of uncertain significance.  Signed,  Dulcy Fanny. Earleen Newport, DO  [facility]  [facility]   Electronically Signed   By: Corrie Mckusick D.O.   On: 01/28/2015 19:33   Ct Head Wo Contrast  01/27/2015   CLINICAL DATA:  Weakness.  EXAM: CT HEAD WITHOUT CONTRAST  TECHNIQUE: Contiguous axial images were obtained from the base of the skull through the vertex without intravenous contrast.  COMPARISON:  Brain MRI 09/17/2011 and CT 09/15/2011  FINDINGS: Chronic left parietal infarct is again seen. There is a chronic lacunar infarct in the right caudate which is new. Patchy hypodensities in the cerebral white matter have slightly progressed from the prior study and are most compatible with chronic small vessel ischemia. There is no definite evidence of acute large territory infarct, intracranial hemorrhage, mass, midline shift, or extra-axial fluid collection. There is mild cerebral atrophy.  Orbits are unremarkable. Visualized mastoid air cells and paranasal sinuses are clear. Mild  carotid siphon and vertebral artery calcification is noted.  IMPRESSION: 1. No acute intracranial abnormality identified. 2. Chronic infarcts and chronic small vessel ischemic disease.   Electronically Signed   By: Logan Bores   On: 01/27/2015 05:02   Mr Brain Wo Contrast  01/27/2015   CLINICAL DATA:  81 year old male with left side weakness and facial droop, with presentation including low-grade fever, leukocytosis. Initial encounter.  EXAM: MRI HEAD WITHOUT CONTRAST  MRA HEAD WITHOUT CONTRAST  TECHNIQUE: Multiplanar, multiecho pulse sequences of the brain and surrounding structures were obtained without intravenous contrast. Angiographic images of the head were obtained using MRA technique without contrast.  COMPARISON:  Head CT without contrast 0114 hours today. Brain MRI and MRA 09/17/2011.  FINDINGS: MRI HEAD FINDINGS  Mild generalized cerebral volume loss since 2012. Major intracranial vascular flow voids are stable.  Chronic left parietal lobe infarct with encephalomalacia. Patchy and confluent bilateral cerebral white matter T2 and FLAIR hyperintensity. Chronic deep white matter capsule T2  and FLAIR hyperintensity on the right. Patchy signal hyperintensity in the pons is chronic. Chronic but increased encephalomalacia due to small infarcts in the inferior left cerebellum.  Subtle white matter restricted diffusion in the right occipital lobe best seen on series 6, image 4, also on series 4, image 22). No other convincing restricted diffusion.  No midline shift, mass effect, evidence of mass lesion, ventriculomegaly, extra-axial collection or acute intracranial hemorrhage. Cervicomedullary junction and pituitary are within normal limits.  Multilevel mild to moderate degenerative cervical spinal stenosis is evident on series 3, image 12 and appears progressed since 2012.  Visible internal auditory structures appear normal. Trace paranasal sinus mucosal thickening. Visualized orbit soft tissues are within  normal limits. Visualized scalp soft tissues are within normal limits.  MRA HEAD FINDINGS  Stable antegrade flow in the posterior circulation with dominant distal right vertebral artery. Normal PICA origins and vertebrobasilar junction. No basilar artery stenosis. SCA and PCA origins remain normal. Mild chronic left P1 segment stenosis is stable. There is a new high-grade stenosis of the a distal right P2 segments seen on series 804, image 18, congruent with the diffusion findings today. Stable left PCA branches.  Stable antegrade flow in both ICA siphons. Mild left supra clinoid ICA stenosis may be increased. Both carotid termini remain patent. Mild irregularity at the right ACA origin is unchanged. MCA and left ACA origins remain normal. Anterior communicating artery and bilateral visualized ACA branches are stable and within normal limits. Visualized bilateral MCA branches are stable and within normal limits.  IMPRESSION: 1. Small acute white matter infarct in the right occipital lobe, associated with new high-grade stenosis of the right PCA distal P2 segment. No mass effect or hemorrhage. 2. Otherwise stable chronic ischemic disease. 3. Otherwise stable intracranial MRA with left supraclinoid ICA and left PCA mild stenoses.   Electronically Signed   By: Genevie Ann M.D.   On: 01/27/2015 11:57   Dg Esophagus  01/28/2015   CLINICAL DATA:  Dysphagia  EXAM: ESOPHOGRAM/BARIUM SWALLOW  TECHNIQUE: Single contrast examination was performed using  thin barium.  FLUOROSCOPY TIME:  Radiation Exposure Index (as provided by the fluoroscopic device):  If the device does not provide the exposure index:  Fluoroscopy Time:  1 Min and 0 seconds  Number of Acquired Images:  COMPARISON:  None.  FINDINGS: Presbyesophagus with decreased peristalsis of the esophagus. Negative for stricture or mass. No diverticulum  Small hiatal hernia without reflux.  No aspiration identified.  Barium tablet passed readily into the stomach without  delay.  IMPRESSION: Presbyesophagus  Hiatal hernia without stricture.   Electronically Signed   By: Franchot Gallo M.D.   On: 01/28/2015 13:33   Dg Chest Port 1 View  01/27/2015   CLINICAL DATA:  Initial evaluation for acute weakness.  EXAM: PORTABLE CHEST - 1 VIEW  COMPARISON:  Prior study from 09/15/2011  FINDINGS: The cardiac and mediastinal silhouettes are stable in size and contour, and remain within normal limits. Atheromatous plaque present within the aortic arch.  The lungs are hypoinflated. Patchy and streaky bibasilar opacities favored to reflect atelectasis/bronchovascular crowding. Superimposed patchy infiltrates not entirely excluded, particularly within the retrocardiac left lower lobe. No pulmonary edema or pleural effusion. No pneumothorax.  No acute osseous abnormality.  IMPRESSION: Shallow lung inflation with mild patchy and linear bibasilar opacities, left greater than right. Findings favored to reflect bronchovascular crowding and/or atelectasis. Possible infiltrates not entirely excluded, and could be considered in the correct clinical setting.   Electronically Signed  By: Jeannine Boga M.D.   On: 01/27/2015 03:49   Mr Jodene Nam Head/brain Wo Cm  01/27/2015   CLINICAL DATA:  81 year old male with left side weakness and facial droop, with presentation including low-grade fever, leukocytosis. Initial encounter.  EXAM: MRI HEAD WITHOUT CONTRAST  MRA HEAD WITHOUT CONTRAST  TECHNIQUE: Multiplanar, multiecho pulse sequences of the brain and surrounding structures were obtained without intravenous contrast. Angiographic images of the head were obtained using MRA technique without contrast.  COMPARISON:  Head CT without contrast 0114 hours today. Brain MRI and MRA 09/17/2011.  FINDINGS: MRI HEAD FINDINGS  Mild generalized cerebral volume loss since 2012. Major intracranial vascular flow voids are stable.  Chronic left parietal lobe infarct with encephalomalacia. Patchy and confluent bilateral  cerebral white matter T2 and FLAIR hyperintensity. Chronic deep white matter capsule T2 and FLAIR hyperintensity on the right. Patchy signal hyperintensity in the pons is chronic. Chronic but increased encephalomalacia due to small infarcts in the inferior left cerebellum.  Subtle white matter restricted diffusion in the right occipital lobe best seen on series 6, image 4, also on series 4, image 22). No other convincing restricted diffusion.  No midline shift, mass effect, evidence of mass lesion, ventriculomegaly, extra-axial collection or acute intracranial hemorrhage. Cervicomedullary junction and pituitary are within normal limits.  Multilevel mild to moderate degenerative cervical spinal stenosis is evident on series 3, image 12 and appears progressed since 2012.  Visible internal auditory structures appear normal. Trace paranasal sinus mucosal thickening. Visualized orbit soft tissues are within normal limits. Visualized scalp soft tissues are within normal limits.  MRA HEAD FINDINGS  Stable antegrade flow in the posterior circulation with dominant distal right vertebral artery. Normal PICA origins and vertebrobasilar junction. No basilar artery stenosis. SCA and PCA origins remain normal. Mild chronic left P1 segment stenosis is stable. There is a new high-grade stenosis of the a distal right P2 segments seen on series 804, image 18, congruent with the diffusion findings today. Stable left PCA branches.  Stable antegrade flow in both ICA siphons. Mild left supra clinoid ICA stenosis may be increased. Both carotid termini remain patent. Mild irregularity at the right ACA origin is unchanged. MCA and left ACA origins remain normal. Anterior communicating artery and bilateral visualized ACA branches are stable and within normal limits. Visualized bilateral MCA branches are stable and within normal limits.  IMPRESSION: 1. Small acute white matter infarct in the right occipital lobe, associated with new  high-grade stenosis of the right PCA distal P2 segment. No mass effect or hemorrhage. 2. Otherwise stable chronic ischemic disease. 3. Otherwise stable intracranial MRA with left supraclinoid ICA and left PCA mild stenoses.   Electronically Signed   By: Genevie Ann M.D.   On: 01/27/2015 11:57     PERTINENT LAB RESULTS: CBC:  Recent Labs  01/27/15 0021 01/28/15 0800  WBC 18.6* 8.8  HGB 12.5* 11.6*  HCT 36.9* 35.5*  PLT 219 203   CMET CMP     Component Value Date/Time   NA 135 01/28/2015 0800   K 4.0 01/28/2015 0800   CL 104 01/28/2015 0800   CO2 20 01/28/2015 0800   GLUCOSE 247* 01/28/2015 0800   BUN 9 01/28/2015 0800   CREATININE 1.34 01/28/2015 0800   CALCIUM 9.0 01/28/2015 0800   PROT 6.4 01/27/2015 0021   ALBUMIN 4.3 01/27/2015 0021   AST 24 01/27/2015 0021   ALT 22 01/27/2015 0021   ALKPHOS 71 01/27/2015 0021   BILITOT 0.8 01/27/2015 0021  GFRNONAA 46* 01/28/2015 0800   GFRAA 53* 01/28/2015 0800    GFR Estimated Creatinine Clearance: 37.6 mL/min (by C-G formula based on Cr of 1.34). No results for input(s): LIPASE, AMYLASE in the last 72 hours.  Recent Labs  01/27/15 0021  TROPONINI <0.03   Invalid input(s): POCBNP No results for input(s): DDIMER in the last 72 hours.  Recent Labs  01/28/15 0436  HGBA1C 7.7*    Recent Labs  01/28/15 0436  CHOL 119  HDL 50  LDLCALC 50  TRIG 97  CHOLHDL 2.4   No results for input(s): TSH, T4TOTAL, T3FREE, THYROIDAB in the last 72 hours.  Invalid input(s): FREET3 No results for input(s): VITAMINB12, FOLATE, FERRITIN, TIBC, IRON, RETICCTPCT in the last 72 hours. Coags:  Recent Labs  01/27/15 1155  INR 1.17   Microbiology: Recent Results (from the past 240 hour(s))  Culture, blood (routine x 2) Call MD if unable to obtain prior to antibiotics being given     Status: None (Preliminary result)   Collection Time: 01/27/15 11:57 AM  Result Value Ref Range Status   Specimen Description BLOOD RIGHT ARM  Final    Special Requests BOTTLES DRAWN AEROBIC AND ANAEROBIC 10CC EACH  Final   Culture   Final           BLOOD CULTURE RECEIVED NO GROWTH TO DATE CULTURE WILL BE HELD FOR 5 DAYS BEFORE ISSUING A FINAL NEGATIVE REPORT Performed at [facility]    Report Status PENDING  Incomplete  Culture, blood (routine x 2) Call MD if unable to obtain prior to antibiotics being given     Status: None (Preliminary result)   Collection Time: 01/27/15 12:00 PM  Result Value Ref Range Status   Specimen Description BLOOD RIGHT HAND  Final   Special Requests BOTTLES DRAWN AEROBIC ONLY [place]  Final   Culture   Final           BLOOD CULTURE RECEIVED NO GROWTH TO DATE CULTURE WILL BE HELD FOR 5 DAYS BEFORE ISSUING A FINAL NEGATIVE REPORT Performed at [facility]    Report Status PENDING  Incomplete  Respiratory virus panel     Status: None   Collection Time: 01/27/15  3:51 PM  Result Value Ref Range Status   Respiratory Syncytial Virus A Negative Negative Final   Respiratory Syncytial Virus B Negative Negative Final   Influenza A Negative Negative Final   Influenza B Negative Negative Final   Parainfluenza 1 Negative Negative Final   Parainfluenza 2 Negative Negative Final   Parainfluenza 3 Negative Negative Final   Metapneumovirus Negative Negative Final   Rhinovirus Negative Negative Final   Adenovirus Negative Negative Final    Comment: (NOTE) Performed At: [facility] [place], [place] [id number] Lindon Romp MD YB:[id number]   Culture, sputum-assessment     Status: None   Collection Time: 01/27/15  3:52 PM  Result Value Ref Range Status   Specimen Description SPUTUM  Final   Special Requests NONE  Final   Sputum evaluation   Final    MICROSCOPIC FINDINGS SUGGEST THAT THIS SPECIMEN IS NOT REPRESENTATIVE OF LOWER RESPIRATORY SECRETIONS. PLEASE RECOLLECT. NOTIFIED Renella Cunas 04.19.16 [place]    Report Status 01/27/2015 FINAL  Final     BRIEF  HOSPITAL COURSE:  CAP (community acquired pneumonia): Admitted and started on intravenous Levaquin,Significantly improved-now has been persistently afebrile, leukocytosis has resolved. Patient is requesting discharge as he is significantly better than on admission.  Blood Cultures negative, influenza PCR negative, urine legionella and streptococcal pneumonia antigen negative. Continue with levofloxacin on discharge for a few more days. Will need repeat chest x-ray in 6-8 weeks.  Active Problems:  Acute CVA: Admitted with some mild left-sided, MRI of the brain shows a small white matter infarct in the right occipital lobe. No deficits on exam evident today. Continue both aspirin and Plavix. 2-D echocardiogram showed preserved ejection fraction with no embolic foci.Carotid Doppler showed around 40-59% stenosis on the left side, nonsignificant stenosis on the right side. Patient will need continued surveillance of the outpatient setting. LDL at goal-at 50-continue Crestor. A1c at 7.7. Physical therapy evaluation is completed, and outpatient physical therapy has been recommended.   Intermittent dysphagia: Mostly to liquids. Barium esophagogram showed no stenosis or stricture or mass. Suspect more for motility disorder, have asked patient to make an appointment with his primary gastroenterologist-Dr. Ardis Hughs.   Diabetes mellitus without complication: CBGs controlled with SSI, not on any anti-diabetic medications at home. Hemoglobin A1c 7.7-will start metformin.    Moderate aortic stenosis: Seen on echocardiogram during stroke workup. Please refer to cardiology as an outpatient.   Dyslipidemia: LDL at goal-continue Crestor   BPH: Continue Flomax   GERD: Continue Protonix   Anxiety: Continue prn Xanax   TODAY-DAY OF DISCHARGE:  Subjective:   Gregory Padilla today has no headache,no chest abdominal pain,no new weakness tingling or numbness, feels much better wants to go home today.    Objective:   Blood pressure 140/54, pulse 66, temperature 98.4 F (36.9 C), temperature source Oral, resp. rate 20, height 5\' 8"  (1.727 m), weight 79.9 kg (176 lb 2.4 oz), SpO2 98 %.  Intake/Output Summary (Last 24 hours) at 01/29/15 0952 Last data filed at 01/29/15 0201  Gross per 24 hour  Intake      0 ml  Output    500 ml  Net   -500 ml   Filed Weights   01/27/15 2100  Weight: 79.9 kg (176 lb 2.4 oz)    Exam Awake Alert, Oriented *3, No new F.N deficits, Normal affect [place].AT,PERRAL Supple Neck,No JVD, No cervical lymphadenopathy appriciated.  Symmetrical Chest wall movement, Good air movement bilaterally, CTAB RRR,No Gallops,Rubs or new Murmurs, No Parasternal Heave +ve B.Sounds, Abd Soft, Non tender, No organomegaly appriciated, No rebound -guarding or rigidity. No Cyanosis, Clubbing or edema, No new Rash or bruise  DISCHARGE CONDITION: Stable  DISPOSITION: Home with outpatient therapy  DISCHARGE INSTRUCTIONS:    Activity:  As tolerated with Full fall precautions use walker/cane & assistance as needed  Diet recommendation: Diabetic Diet Heart Healthy diet  Discharge Instructions    Call MD for:  temperature >100.4    Complete by:  As directed      Diet - low sodium heart healthy    Complete by:  As directed      Diet Carb Modified    Complete by:  As directed      Increase activity slowly    Complete by:  As directed            Follow-up Information    Follow up with Morton Peters, MD. Schedule an appointment as soon as possible for a visit in 1 week.   Specialty:  Family Medicine   Contact information:   [address] [place] [address] [phone]       Follow up with SETHI,PRAMOD, MD. Schedule an appointment as soon as possible for a visit in 2 months.  Specialties:  Neurology, Radiology   Contact information:   [address] [place] [address] [phone]       Follow up with Milus Banister, MD.  Schedule an appointment as soon as possible for a visit in 2 weeks.   Specialty:  Gastroenterology   Contact information:   [address] [address] [phone]       Total Time spent on discharge equals  45 minutes.  SignedOren Binet 01/29/2015 9:52 AM

## 2015-01-29 NOTE — Evaluation (Signed)
Occupational Therapy Evaluation Patient Details Name: Gregory Padilla MRN: [id number] DOB: 05/16/1925 Today's Date: 01/29/2015    History of Present Illness 81 y.o. male admitted with CAP (community acquired pneumonia) and acute CVA.    Clinical Impression   PT admitted with PNA and acute CVA. Pt currently with functional limitiations due to the deficits listed below (see OT problem list). Pt from home with DME for mobility.  Pt will benefit from skilled OT to increase their independence and safety with adls and balance to allow discharge outpatient.     Follow Up Recommendations  Outpatient OT    Equipment Recommendations  None recommended by OT    Recommendations for Other Services       Precautions / Restrictions Precautions Precautions: Fall Precaution Comments: Pt reports falls multiple times due to head turns. Pt reports "I try to sit a lot so I dont fall" Restrictions Weight Bearing Restrictions: No      Mobility Bed Mobility Overal bed mobility: Modified Independent                Transfers Overall transfer level: Needs assistance   Transfers: Sit to/from Stand Sit to Stand: Supervision              Balance                                            ADL Overall ADL's : Needs assistance/impaired Eating/Feeding: Independent;Bed level Eating/Feeding Details (indicate cue type and reason): Pt noted to have coughing and throat clearing. pt reports "i got real choked a while ago. If i hold my head just like this it gets stuck" . SLP contacted immediately concerning throat clearing cough and reports that speech seems slurred to himself Grooming: Wash/dry hands;Wash/dry face;Min guard;Standing Grooming Details (indicate cue type and reason): pt with LOB with head turn in bathroom. Pt with max cues for safety with RWq                 Toilet Transfer: Min guard;Ambulation       Tub/ Shower Transfer: Min guard;Tub  bench;Rolling walker Tub/Shower Transfer Details (indicate cue type and reason): Pt requires cues for safety and positioning of RW. Pt completed transfer and verbalized placement of grab bar at home Functional mobility during ADLs: Minimal assistance;Rolling walker General ADL Comments: Pt with cues for safety and use of RW. Pt with LOB with head turns. Pt fall risk for adls without (A) from wife. Pt could benefit from balance training     Vision Additional Comments: able to read paper. Pt demonstrates word finding issues and verbalized to therapist   Perception     Praxis      Pertinent Vitals/Pain Pain Assessment: No/denies pain     Hand Dominance Right   Extremity/Trunk Assessment Upper Extremity Assessment Upper Extremity Assessment: Overall WFL for tasks assessed;LUE deficits/detail LUE Deficits / Details: noted two black finger nails- pt reports closing hood of car on hand and remaining stuck there until son came to release hood of car   Lower Extremity Assessment Lower Extremity Assessment: Defer to PT evaluation   Cervical / Trunk Assessment Cervical / Trunk Assessment: Normal   Communication Communication Communication: HOH (hearing aids, stand on L side )   Cognition Arousal/Alertness: Awake/alert Behavior During Therapy: WFL for tasks assessed/performed Overall Cognitive Status: Within Functional Limits for tasks assessed  General Comments       Exercises       Shoulder Instructions      Home Living Family/patient expects to be discharged to:: Private residence Living Arrangements: Spouse/significant other Available Help at Discharge: Family;Available 24 hours/day Type of Home: House Home Access: Ramped entrance     Home Layout: One level     Bathroom Shower/Tub: Teacher, early years/pre: Standard     Home Equipment: Cane - single point;Walker - 2 wheels;Bedside commode;Tub bench   Additional Comments: Pt  drives       Prior Functioning/Environment Level of Independence: Independent with assistive device(s)        Comments: uses cane in house, RW at night if going to bathroom.    OT Diagnosis: Generalized weakness;Acute pain   OT Problem List: Decreased strength;Decreased activity tolerance;Impaired balance (sitting and/or standing);Decreased safety awareness;Decreased knowledge of use of DME or AE;Decreased knowledge of precautions   OT Treatment/Interventions: Self-care/ADL training;Therapeutic exercise;DME and/or AE instruction;Therapeutic activities;Patient/family education;Balance training    OT Goals(Current goals can be found in the care plan section) Acute Rehab OT Goals Patient Stated Goal: Go home OT Goal Formulation: With patient Time For Goal Achievement: 02/12/15 Potential to Achieve Goals: Good  OT Frequency: Min 2X/week   Barriers to D/C:            Co-evaluation              End of Session Equipment Utilized During Treatment: Rolling walker Nurse Communication: Mobility status;Precautions  Activity Tolerance: Patient tolerated treatment well Patient left: in chair;with call bell/phone within reach;with nursing/sitter in room   Time: 0815-0849 OT Time Calculation (min): 34 min Charges:  OT General Charges $OT Visit: 1 Procedure OT Evaluation $Initial OT Evaluation Tier I: 1 Procedure OT Treatments $Self Care/Home Management : 8-22 mins G-Codes:    Parke Poisson B 01-30-2015, 11:20 AM  Pager: [phone]

## 2015-01-29 NOTE — Progress Notes (Signed)
D/C orders received, pt for D/C home today.  IV and telemetry D/C.  Rx and D/C instructions given with verbalized understanding.  Family at bedside to assist with D/C.  Staff brought pt downstairs via wheelchair.  

## 2015-01-29 NOTE — Progress Notes (Signed)
CARE MANAGEMENT NOTE 01/29/2015  Patient:  DEZI, SCHANER   Account Number:  [id number]  Date Initiated:  01/27/2015  Documentation initiated by:  Lorne Skeens  Subjective/Objective Assessment:   Patient was admitted with sepsis due to CAP and possible CVA.     Action/Plan:   Will follow for discharge needs pending PT/OT evals and physician orders.   Anticipated DC Date:  01/29/2015   Anticipated DC Plan:  HOME/SELF CARE      DC Planning Services  CM consult  [facility]      Choice offered to / List presented to:             Status of service:   Medicare Important Message given?  YES (If response is "NO", the following Medicare IM given date fields will be blank) Date Medicare IM given:  01/29/2015 Medicare IM given by:  Lorne Skeens Date Additional Medicare IM given:   Additional Medicare IM given by:    Discharge Disposition:  HOME/SELF CARE  Per UR Regulation:  Reviewed for med. necessity/level of care/duration of stay  If discussed at [place] of Stay Meetings, dates discussed:    Comments:  01/29/15 Morton, MSN, CM- Met with patient to discuss discharge planning. Patient is agreeable to outpatient PT and has chosen [facility].  Information was faxed.  Paper prescription was also left with patient, should any need arise after discharge.  Appointment information was added to the AVS. Medicare IM letter provided.  Bedside RN aware of discharge plan.

## 2015-01-29 NOTE — Progress Notes (Signed)
Physical Therapy Treatment Patient Details Name: Gregory Padilla MRN: [id number] DOB: [birth year] Today's Date: 01/29/2015    History of Present Illness 81 y.o. male admitted with CAP (community acquired pneumonia) and acute CVA.     PT Comments    Patient is progressing well towards physical therapy goals, ambulating up to 525 feet today without need for physical assist. Focused on balance with dynamic gait training. Patient will continue to benefit from skilled physical therapy services at outpatient rehab clinic to further improve independence with functional mobility.    Follow Up Recommendations  Outpatient PT (Outpatient Neuro clinic)     Equipment Recommendations  None recommended by PT    Recommendations for Other Services       Precautions / Restrictions Precautions Precautions: Fall Precaution Comments: Pt reports falls multiple times due to head turns. Pt reports "I try to sit a lot so I dont fall" Restrictions Weight Bearing Restrictions: No    Mobility  Bed Mobility Overal bed mobility: Modified Independent                Transfers Overall transfer level: Needs assistance Equipment used: Rolling walker (2 wheeled) Transfers: Sit to/from Stand Sit to Stand: Supervision         General transfer comment: supervision for safety. Cues for walker placement in front of patient prior to standing and sitting. Appropriately verbalizes and demonstrates hand placement today.  Ambulation/Gait Ambulation/Gait assistance: Supervision Ambulation Distance (Feet): 525 Feet Assistive device: Rolling walker (2 wheeled) Gait Pattern/deviations: Step-through pattern;Decreased stride length;Trunk flexed   Gait velocity interpretation: at or above normal speed for age/gender General Gait Details: Intermittent cues for walker placement for proximity and upright posture. No loss of balance noted today. Practiced figure-8s with emphasis on keeping a wider base of  support as this was challenging for pt and he demonstrates mild difficulty with balance during turns as his stance was too narrow.    Stairs            Wheelchair Mobility    Modified Rankin (Stroke Patients Only) Modified Rankin (Stroke Patients Only) Pre-Morbid Rankin Score: Moderate disability Modified Rankin: Moderately severe disability     Balance                                    Cognition Arousal/Alertness: Awake/alert Behavior During Therapy: WFL for tasks assessed/performed Overall Cognitive Status: Within Functional Limits for tasks assessed                      Exercises      General Comments General comments (skin integrity, edema, etc.): wound on LT 2nd and 3rd digits      Pertinent Vitals/Pain Pain Assessment: No/denies pain    Home Living Family/patient expects to be discharged to:: Private residence Living Arrangements: Spouse/significant other Available Help at Discharge: Family;Available 24 hours/day Type of Home: House Home Access: Ramped entrance   Home Layout: One level Home Equipment: Cane - single point;Walker - 2 wheels;Bedside commode;Tub bench Additional Comments: Pt drives     Prior Function Level of Independence: Independent with assistive device(s)      Comments: uses cane in house, RW at night if going to bathroom.   PT Goals (current goals can now be found in the care plan section) Acute Rehab PT Goals Patient Stated Goal: Go home PT Goal Formulation: With patient/family Time For Goal Achievement: 02/11/15 Potential to  Achieve Goals: Good Progress towards PT goals: Progressing toward goals    Frequency  Min 4X/week    PT Plan Current plan remains appropriate    Co-evaluation             End of Session Equipment Utilized During Treatment: Gait belt Activity Tolerance: Patient tolerated treatment well Patient left: with call bell/phone within reach;in chair;with chair alarm set      Time: 8850-2774 PT Time Calculation (min) (ACUTE ONLY): 20 min  Charges:  $Gait Training: 8-22 mins                    G Codes:      Ellouise Newer January 30, 2015, 12:35 PM Camille Bal [place], [place]

## 2015-01-29 NOTE — Progress Notes (Signed)
MBSS complete. Full report located under chart review in imaging section. Supervised and reviewed by Herbie Baltimore MA CCC-SLP

## 2015-01-29 NOTE — Evaluation (Signed)
Clinical/Bedside Swallow Evaluation Patient Details  Name: Gregory Padilla MRN: [id number] Date of Birth: 12/07/1924  Today's Date: 01/29/2015 Time: SLP Start Time (ACUTE ONLY): 4098 SLP Stop Time (ACUTE ONLY): 0852 SLP Time Calculation (min) (ACUTE ONLY): 10 min  Past Medical History:  Past Medical History  Diagnosis Date  . Diabetes mellitus age 81    no insulin  . Stroke   . Arthritis   . Carotid artery occlusion   . CVA (cerebral vascular accident)     left sided weakness  . Hyperlipidemia   . Hypertension   . Meniere disease   . Degenerative joint disease   . BPH (benign prostatic hyperplasia)   . Skin cancer     history of skin cancer  . Coccidioidomycosis    Past Surgical History:  Past Surgical History  Procedure Laterality Date  . Cholecystectomy    . Prostate surgery      x2  . Back surgery  2004   HPI:  Gregory Padilla is a 81 y.o. male with past medical history of hypertension, hyperlipidemia, diet-controlled diabetes, GERD, stroke with left-sided weakness, BPH, history of skin cancer, history of arthritis, history of carotid artery stenosis, who presents with fever, chills, left-sided weakness and facial droop. Pt found to ahve a small occipatal infarct. Pt also reports mild difficutly swallowing with occasional choking with thin liquids since prior CVA.    Assessment / Plan / Recommendation Clinical Impression   Pt was seen for swallowing evaluation. Pt clearing throat at baseline. PO trials with thin liquids and solid consistencies did not elicit coughing or choking, however, noted pt consistently clears his throat following small or large consecutive sips of thin liquids, which are concerning for penetration. Pt reported choking event during breakfast this morning. Pt also observed coughing during meals by OT. Given evidence of consistent throat clearing and pt concern for choking during meals, recommend MBS to objectively evaluate swallowing function.  Speech will follow-up with MBS findings.     Aspiration Risk  Mild    Diet Recommendation Regular;Thin liquid   Liquid Administration via: Cup;Straw Medication Administration: Whole meds with liquid Supervision: Patient able to self feed Compensations: Slow rate;Small sips/bites Postural Changes and/or Swallow Maneuvers: Seated upright 90 degrees;Out of bed for meals    Other  Recommendations Recommended Consults: MBS Oral Care Recommendations: Oral care BID   Follow Up Recommendations       Frequency and Duration        Pertinent Vitals/Pain     SLP Swallow Goals     Swallow Study Prior Functional Status       General HPI: Gregory Padilla is a 81 y.o. male with past medical history of hypertension, hyperlipidemia, diet-controlled diabetes, GERD, stroke with left-sided weakness, BPH, history of skin cancer, history of arthritis, history of carotid artery stenosis, who presents with fever, chills, left-sided weakness and facial droop. Pt found to ahve a small occipatal infarct. Pt also reports mild difficutly swallowing with occasional choking with thin liquids since prior CVA.  Type of Study: Bedside swallow evaluation Previous Swallow Assessment: None Diet Prior to this Study: Regular;Thin liquids Temperature Spikes Noted: No Respiratory Status: Room air History of Recent Intubation: No Behavior/Cognition: Alert;Cooperative;Pleasant mood Oral Cavity - Dentition: Adequate natural dentition Self-Feeding Abilities: Able to feed self Patient Positioning: Upright in bed Baseline Vocal Quality: Clear Volitional Cough: Strong    Oral/Motor/Sensory Function Overall Oral Motor/Sensory Function: Appears within functional limits for tasks assessed Labial ROM: Within Functional Limits Labial  Symmetry: Within Functional Limits Lingual ROM: Within Functional Limits Lingual Symmetry: Within Functional Limits Facial ROM: Within Functional Limits Facial Symmetry: Within  Functional Limits   Ice Chips Ice chips: Not tested   Thin Liquid Thin Liquid: Impaired Presentation: Cup;Straw;Self Fed Pharyngeal  Phase Impairments: Throat Clearing - Immediate    Nectar Thick Nectar Thick Liquid: Not tested   Honey Thick Honey Thick Liquid: Not tested   Puree Puree: Not tested   Solid   GO    Solid: Within functional limits       Bermudez-Bosch, Kerrion Kemppainen 01/29/2015,9:22 AM

## 2015-02-02 LAB — CULTURE, BLOOD (ROUTINE X 2)
Culture: NO GROWTH
Culture: NO GROWTH

## 2015-03-30 ENCOUNTER — Ambulatory Visit: Payer: Medicare Other | Admitting: Physical Therapy

## 2015-04-15 ENCOUNTER — Ambulatory Visit: Payer: Medicare Other | Admitting: Physical Therapy

## 2015-04-15 ENCOUNTER — Encounter: Payer: Self-pay | Admitting: Physical Therapy

## 2015-04-15 DIAGNOSIS — R269 Unspecified abnormalities of gait and mobility: Secondary | ICD-10-CM | POA: Insufficient documentation

## 2015-04-15 DIAGNOSIS — R2689 Other abnormalities of gait and mobility: Secondary | ICD-10-CM

## 2015-04-15 DIAGNOSIS — R2681 Unsteadiness on feet: Secondary | ICD-10-CM | POA: Diagnosis present

## 2015-04-15 DIAGNOSIS — R29818 Other symptoms and signs involving the nervous system: Secondary | ICD-10-CM | POA: Diagnosis present

## 2015-04-15 DIAGNOSIS — R279 Unspecified lack of coordination: Secondary | ICD-10-CM | POA: Diagnosis present

## 2015-04-16 NOTE — Therapy (Signed)
[facility] [address] [place], [place], [address] Phone: [phone]   Fax:  [phone]  Physical Therapy Evaluation  Patient Details  Name: Gregory Padilla MRN: [id number] Date of Birth: [birth year] Referring Provider:  Morton Peters.*  Encounter Date: 04/15/2015      PT End of Session - 04/15/15 1400    Visit Number 1   Number of Visits 17   Date for PT Re-Evaluation 06/12/15   PT Start Time 1400   PT Stop Time 1445   PT Time Calculation (min) 45 min   Equipment Utilized During Treatment Gait belt   Activity Tolerance Patient tolerated treatment well   Behavior During Therapy [facility] for tasks assessed/performed      Past Medical History  Diagnosis Date  . Diabetes mellitus age 57    no insulin  . Stroke   . Arthritis   . Carotid artery occlusion   . CVA (cerebral vascular accident)     left sided weakness  . Hyperlipidemia   . Hypertension   . Meniere disease   . Degenerative joint disease   . BPH (benign prostatic hyperplasia)   . Skin cancer     history of skin cancer  . Coccidioidomycosis     Past Surgical History  Procedure Laterality Date  . Cholecystectomy    . Prostate surgery      x2  . Back surgery  2004    There were no vitals filed for this visit.  Visit Diagnosis:  Abnormality of gait  Balance problems  Unsteadiness      Subjective Assessment - 04/15/15 1412    Subjective this 81yo male had 5th CVA in May 2016. He presents for PT evaluation.   Patient is accompained by: Family member   Patient Stated Goals To improve balance,    Currently in Pain? Yes   Pain Score 4    Pain Location Abdomen   Pain Orientation Right   Pain Descriptors / Indicators Sharp   Pain Type Chronic pain   Pain Onset More than a month ago   Pain Frequency Intermittent   Aggravating Factors  standing up   Pain Relieving Factors sitting   Effect of Pain on Daily Activities [facility]   Multiple Pain Sites No            [facility] PT Assessment - 04/15/15 1400    Assessment   Medical Diagnosis CVA   Onset Date/Surgical Date 01/26/15   Precautions   Precautions Fall   Restrictions   Weight Bearing Restrictions No   Balance Screen   Has the patient fallen in the past 6 months Yes   How many times? ~2 times / week   Has the patient had a decrease in activity level because of a fear of falling?  No   Is the patient reluctant to leave their home because of a fear of falling?  No   Home Environment   Living Environment Private residence   Living Arrangements Spouse/significant other   Type of War - single point   Prior Function   Level of Independence Independent with household mobility with device;Independent with community mobility with device;Independent with homemaking with ambulation;Independent with gait  single point cane   ROM / Strength   AROM / PROM / Strength AROM;Strength   AROM   Overall  AROM  Within functional limits for tasks performed   Strength   Overall Strength Within functional limits for tasks performed   Transfers   Transfers Sit to Stand;Stand to Sit   Sit to Stand 5: Supervision;With upper extremity assist;With armrests;From chair/3-in-1   Stand to Sit 5: Supervision;With upper extremity assist;With armrests;To chair/3-in-1   Ambulation/Gait   Ambulation/Gait Yes   Ambulation/Gait Assistance 5: Supervision   Ambulation Distance (Feet) 100 Feet   Assistive device Straight cane   Gait Pattern Step-through pattern;Decreased stride length;Trunk flexed;Narrow base of support   Ambulation Surface Indoor;Level   Gait velocity 3.49 ft/sec   Berg Balance Test   Sit to Stand Able to stand  independently using hands   Standing Unsupported Able to stand 2 minutes with supervision   Sitting with Back Unsupported but Feet Supported on Floor or  Stool Able to sit safely and securely 2 minutes   Stand to Sit Controls descent by using hands   Transfers Able to transfer safely, definite need of hands   Standing Unsupported with Eyes Closed Able to stand 10 seconds with supervision   Standing Ubsupported with Feet Together Needs help to attain position but able to stand for 30 seconds with feet together   From Standing, Reach Forward with Outstretched Arm Reaches forward but needs supervision   From Standing Position, Pick up Object from Floor Able to pick up shoe, needs supervision   From Standing Position, Turn to Look Behind Over each Shoulder Turn sideways only but maintains balance   Turn 360 Degrees Needs assistance while turning   Standing Unsupported, Alternately Place Feet on Step/Stool Needs assistance to keep from falling or unable to try   Standing Unsupported, One Foot in Front Needs help to step but can hold 15 seconds   Standing on One Leg Tries to lift leg/unable to hold 3 seconds but remains standing independently   Total Score 28   Timed Up and Go Test   Normal TUG (seconds) 11.82  11.82 sec with cane & 12.12 without device   Functional Gait  Assessment   Gait assessed  --                             PT Short Term Goals - 04/15/15 1400    PT SHORT TERM GOAL #1   Title Patient demonstrates understanding of initial HEP. (Target Date: 05/15/2015)   Time 4   Period Weeks   Status New   PT SHORT TERM GOAL #2   Title Berg Balance >34/56 (Target Date: 05/15/2015)   Time 4   Period Weeks   Status New   PT SHORT TERM GOAL #3   Title Patient ambulates 300' with single point cane with supervision. (Target Date: 05/15/2015)   Time 4   Period Weeks   Status New   PT SHORT TERM GOAL #4   Title Patient negotiates ramps & curbs with single point cane with contact assist. (Target Date: 05/15/2015)   Time 4   Period Weeks   Status New           PT Long Term Goals - 04/15/15 1400    PT LONG TERM GOAL  #1   Title Patient & wife verbalize & demonstrate updated HEP / fitness plan. (Target Date: 06/12/2015)   Time 8   Period Weeks   Status New   PT LONG TERM GOAL #2   Title Berg Balance >/= 40/56  (Target  Date: 06/12/2015)   Time 8   Period Weeks   Status New   PT LONG TERM GOAL #3   Title Patient ambulates 300' with single point cane modified independent.  (Target Date: 06/12/2015)   Time 8   Period Weeks   Status New   PT LONG TERM GOAL #4   Title Patient negotiates ramp & curb with single point cane modified independent.  (Target Date: 06/12/2015)   Time 8   Period Weeks   Status New   PT LONG TERM GOAL #5   Title Patient ambulates 18' around furniture without device modified independent.  (Target Date: 06/12/2015)   Time 8   Period Weeks   Status New               Plan - Apr 28, 2015 1400    Clinical Impression Statement This 81yo male had his 5th CVA which resulted in balance deficits increasing his fall risk. He has gait deviations placing him at risk of falls. Patient would benefit from skilled PT to return his functional status to prior level  and reduce fall risk.   Pt will benefit from skilled therapeutic intervention in order to improve on the following deficits Abnormal gait;Decreased activity tolerance;Decreased balance;Decreased endurance;Decreased mobility;Decreased safety awareness;Decreased strength   Rehab Potential Good   PT Frequency 2x / week   PT Duration 8 weeks   PT Treatment/Interventions ADLs/Self Care Home Management;DME Instruction;Gait training;Stair training;Functional mobility training;Therapeutic activities;Therapeutic exercise;Balance training;Neuromuscular re-education;Patient/family education   PT Next Visit Plan HEP for balance   Consulted and Agree with Plan of Care Patient;Family member/caregiver   Family Member Consulted wife          G-Codes - April 28, 2015 1400    Functional Assessment Tool Used Berg Balance 28/56   Functional Limitation  Mobility: Walking and moving around   Mobility: Walking and Moving Around Current Status [phone]) At least 60 percent but less than 80 percent impaired, limited or restricted   Mobility: Walking and Moving Around Goal Status [phone]) At least 40 percent but less than 60 percent impaired, limited or restricted       Problem List Patient Active Problem List   Diagnosis Date Noted  . CAP (community acquired pneumonia) 01/27/2015  . Sepsis 01/27/2015  . Community acquired bacterial pneumonia 01/27/2015  . Stroke   . Arthritis   . Carotid artery occlusion   . CVA (cerebral vascular accident)   . Hyperlipidemia   . Hypertension   . BPH (benign prostatic hyperplasia)   . Skin cancer   . Weakness   . Pain in limb-Right trunk 07/18/2013  . Occlusion and stenosis of carotid artery without mention of cerebral infarction 07/18/2013  . TIA (transient ischemic attack) 09/15/2011  . Diabetes mellitus without complication 17/79/3903  . PERSONAL HISTORY OF COLONIC POLYPS 10/28/2009    Santiana Glidden PT, DPT 04/16/2015, 7:19 PM  [facility] [address] [place], [place], [address] Phone: [phone]   Fax:  [phone]

## 2015-04-17 ENCOUNTER — Ambulatory Visit: Payer: Medicare Other | Admitting: Physical Therapy

## 2015-04-17 DIAGNOSIS — R278 Other lack of coordination: Secondary | ICD-10-CM

## 2015-04-17 DIAGNOSIS — R269 Unspecified abnormalities of gait and mobility: Secondary | ICD-10-CM

## 2015-04-17 DIAGNOSIS — R279 Unspecified lack of coordination: Secondary | ICD-10-CM

## 2015-04-17 NOTE — Patient Instructions (Addendum)
SIT TO STAND: No Device   Sit with feet shoulder-width apart, on floor. Make sure feet are positioned behind knees. Lean chest forward, raise hips up from surface. Straighten hips and knees. Don't use your hands to push up.  Weight bear equally on left and right sides. _20__ reps per set, 2-3 sets per day.     Stand with your back to a corner and with a stable chair in front of you in case you lose your balance. Stand on a PILLOW with arms by your side. Make sure your feet are shoulder width apart. Close eyes and visualize upright position. Hold 30 seconds. Do this 3-4 times per day, every day.

## 2015-04-17 NOTE — Therapy (Signed)
[facility] [address] [place], [place], [address] Phone: [phone]   Fax:  [phone]  Physical Therapy Treatment  Patient Details  Name: Gregory Padilla MRN: [id number] Date of Birth: 06/12/1925 Referring Provider:  Morton Peters.*  Encounter Date: 04/17/2015      PT End of Session - 04/17/15 1802    Visit Number 2   Number of Visits 17   Date for PT Re-Evaluation 06/12/15   Authorization Type Medicare - GCodes every 10 visits   PT Start Time 2202   PT Stop Time 1450   PT Time Calculation (min) 47 min   Equipment Utilized During Treatment Gait belt   Activity Tolerance Patient tolerated treatment well   Behavior During Therapy [facility] for tasks assessed/performed      Past Medical History  Diagnosis Date  . Diabetes mellitus age 46    no insulin  . Stroke   . Arthritis   . Carotid artery occlusion   . CVA (cerebral vascular accident)     left sided weakness  . Hyperlipidemia   . Hypertension   . Meniere disease   . Degenerative joint disease   . BPH (benign prostatic hyperplasia)   . Skin cancer     history of skin cancer  . Coccidioidomycosis     Past Surgical History  Procedure Laterality Date  . Cholecystectomy    . Prostate surgery      x2  . Back surgery  2004    There were no vitals filed for this visit.  Visit Diagnosis:  Abnormality of gait  Decreased coordination      Subjective Assessment - 04/17/15 1408    Subjective Pt reports no falls since prior to PT evaluation. Pt has had many losses of balance due to turning while standing. Pt also has difficulty standing up from low chairs. Also, pt reporting that L toe drags sometimes, "especially when I'm going up a hill," per pt.   Pertinent History Longstanding Meniere's disease in B ears; CVA x5 (last stroke in April 2016); speak slowly, as pt reads lips due to hearing impairment.   Patient Stated Goals To improve  balance,    Currently in Pain? No/denies      Treatment   During gait training without SPC, noted even more narrow BOS, R hip drop during LLE stance which appeared to cause RLE adduction/scissoring and R lateral trunk lean. Said RLE scissoring caused increased gait instability (pt nearly collided with wall on R side when leaving clinic) with head turning and turning entire body during gait. MMT of gluteus medius reveals 4/5 on R, 4-/5 on L. Also, noted intermittent L toe catch despite L ankle dorsiflexion ROM/strength grossly WFL.  Neuro Re-ed: - See Pt Instructions for home exercises performed by patient during this session. - See below for detailed description of additional balance exercises performed.        [facility] PT Assessment - 04/17/15 0001    Dynamic Standing Balance   Dynamic Standing - Balance Support No upper extremity supported   Dynamic Standing - Level of Assistance 5: Stand by assistance;4: Min assist   Dynamic Standing - Balance Activities Compliant surface;Eyes opn;Eyes closed;Head turns;Head nods   Dynamic Standing - Comments Standing on pillow with eyes closed 2 x30 sec without UE support requiring supervision due to increased postural sway. Standing on compliant surface, eyes closed performing head turns with supervision for horizontal and diagonal head turns, min guard to min A  for vertical head turns due to consistent posterior LOB with looking upwards.                     [facility] PT Treatment/Exercise - 04/17/15 0001    Transfers   Transfers Sit to Stand;Stand to Sit   Sit to Stand 5: Supervision;6: Modified independent (Device/Increase time)   Sit to Stand Details Tactile cues for weight shifting;Tactile cues for placement;Verbal cues for technique   Sit to Stand Details (indicate cue type and reason) Initially required cueing for sit <> stand from low mat; progressed to mod I within session   Stand to Sit 6: Modified independent (Device/Increase  time);5: Supervision   Ambulation/Gait   Ambulation/Gait Yes   Ambulation/Gait Assistance 5: Supervision;4: Min guard   Ambulation Distance (Feet) 230 Feet   Assistive device None;Straight cane   Gait Pattern Narrow base of support;Poor foot clearance - left;Trunk flexed;Step-through pattern;Trendelenburg;Lateral trunk lean to right   Ambulation Surface Level;Indoor                PT Education - 04/17/15 1733    Education provided Yes   Education Details HEP   Person(s) Educated Patient   Methods Explanation;Demonstration   Comprehension Verbalized understanding;Returned demonstration          PT Short Term Goals - 04/15/15 1400    PT SHORT TERM GOAL #1   Title Patient demonstrates understanding of initial HEP. (Target Date: 05/15/2015)   Time 4   Period Weeks   Status New   PT SHORT TERM GOAL #2   Title Berg Balance >34/56 (Target Date: 05/15/2015)   Time 4   Period Weeks   Status New   PT SHORT TERM GOAL #3   Title Patient ambulates 300' with single point cane with supervision. (Target Date: 05/15/2015)   Time 4   Period Weeks   Status New   PT SHORT TERM GOAL #4   Title Patient negotiates ramps & curbs with single point cane with contact assist. (Target Date: 05/15/2015)   Time 4   Period Weeks   Status New           PT Long Term Goals - 04/15/15 1400    PT LONG TERM GOAL #1   Title Patient & wife verbalize & demonstrate updated HEP / fitness plan. (Target Date: 06/12/2015)   Time 8   Period Weeks   Status New   PT LONG TERM GOAL #2   Title Berg Balance >/= 40/56  (Target Date: 06/12/2015)   Time 8   Period Weeks   Status New   PT LONG TERM GOAL #3   Title Patient ambulates 300' with single point cane modified independent.  (Target Date: 06/12/2015)   Time 8   Period Weeks   Status New   PT LONG TERM GOAL #4   Title Patient negotiates ramp & curb with single point cane modified independent.  (Target Date: 06/12/2015)   Time 8   Period Weeks   Status New    PT LONG TERM GOAL #5   Title Patient ambulates 60' around furniture without device modified independent.  (Target Date: 06/12/2015)   Time 8   Period Weeks   Status New               Plan - 04/17/15 1804    Clinical Impression Statement When ambulating longer distances without AD, pt demonstrating gait pattern characterized by very narrow BOS, R hip drop (L hip ABD strength  grossly WFL), RLE adduction/scissoring, and R lateral trunk flexion. Noted consistent posterior LOB with ineffective balance recovery strategies with increased reliance on vestibular input during balance activities. Continue per POC.   Pt will benefit from skilled therapeutic intervention in order to improve on the following deficits Abnormal gait;Decreased activity tolerance;Decreased balance;Decreased endurance;Decreased mobility;Decreased safety awareness;Decreased strength   Rehab Potential Good   Clinical Impairments Affecting Rehab Potential Bilateral Meniere's disease   PT Frequency 2x / week   PT Duration 8 weeks   PT Treatment/Interventions ADLs/Self Care Home Management;DME Instruction;Gait training;Stair training;Functional mobility training;Therapeutic activities;Therapeutic exercise;Balance training;Neuromuscular re-education;Patient/family education   PT Next Visit Plan Expand on HEP. Focus on general static/dynamic balance training, as longstanding history of B Meniere's disease has likely caused stable vestibular loss.   Consulted and Agree with Plan of Care Patient        Problem List Patient Active Problem List   Diagnosis Date Noted  . CAP (community acquired pneumonia) 01/27/2015  . Sepsis 01/27/2015  . Community acquired bacterial pneumonia 01/27/2015  . Stroke   . Arthritis   . Carotid artery occlusion   . CVA (cerebral vascular accident)   . Hyperlipidemia   . Hypertension   . BPH (benign prostatic hyperplasia)   . Skin cancer   . Weakness   . Pain in limb-Right trunk 07/18/2013   . Occlusion and stenosis of carotid artery without mention of cerebral infarction 07/18/2013  . TIA (transient ischemic attack) 09/15/2011  . Diabetes mellitus without complication 75/88/3254  . PERSONAL HISTORY OF COLONIC POLYPS 10/28/2009    Billie Ruddy, PT, DPT [facility] [address] [place], [place], [address] Phone: [phone]   Fax:  [phone] 04/17/2015, 6:16 PM

## 2015-04-21 ENCOUNTER — Ambulatory Visit: Payer: Medicare Other

## 2015-04-21 DIAGNOSIS — R269 Unspecified abnormalities of gait and mobility: Secondary | ICD-10-CM | POA: Diagnosis not present

## 2015-04-21 DIAGNOSIS — R2681 Unsteadiness on feet: Secondary | ICD-10-CM

## 2015-04-21 NOTE — Therapy (Signed)
[facility] [address] [place], [place], [address] Phone: [phone]   Fax:  [phone]  Physical Therapy Treatment  Patient Details  Name: Gregory Padilla MRN: [id number] Date of Birth: 05-Jan-1925 Referring Provider:  Morton Peters.*  Encounter Date: 04/21/2015      PT End of Session - 04/21/15 1932    Visit Number 3   Number of Visits 17   Date for PT Re-Evaluation 06/12/15   Authorization Type Medicare - GCodes every 10 visits   PT Start Time 1450  pt arrived late   PT Stop Time 1529   PT Time Calculation (min) 39 min   Activity Tolerance Patient tolerated treatment well   Behavior During Therapy [facility] for tasks assessed/performed      Past Medical History  Diagnosis Date  . Diabetes mellitus age 89    no insulin  . Stroke   . Arthritis   . Carotid artery occlusion   . CVA (cerebral vascular accident)     left sided weakness  . Hyperlipidemia   . Hypertension   . Meniere disease   . Degenerative joint disease   . BPH (benign prostatic hyperplasia)   . Skin cancer     history of skin cancer  . Coccidioidomycosis     Past Surgical History  Procedure Laterality Date  . Cholecystectomy    . Prostate surgery      x2  . Back surgery  2004    There were no vitals filed for this visit.  Visit Diagnosis:  Unsteadiness  Abnormality of gait      Subjective Assessment - 04/21/15 1454    Subjective Pt denied changes but stated he had a near fall and almost ran into L wall but reported the wall caught him.   Pertinent History Longstanding Meniere's disease in B ears; CVA x5 (last stroke in April 2016); speak slowly, as pt reads lips due to hearing impairment.   Patient Stated Goals To improve balance,    Currently in Pain? No/denies                              Balance Exercises - 04/21/15 1508    OTAGO PROGRAM   Head Movements Standing;5 reps   Neck  Movements Standing;5 reps   Back Extension Standing;5 reps   Trunk Movements Standing;5 reps   Ankle Movements Standing;10 reps   Knee Extensor 10 reps;Weight (comment)  2 lbs.   Knee Flexor 10 reps   Hip ABductor 10 reps     Pt also demonstrated current exercises he is performing at home (heel raises in doorway), cervical retraction, and modified pec stretch. PT encouraged pt to perform OTAGO in place of his exercise to ensure proper technique, frequency and reps. Pt reported he performs 100-200 heel raises/night.      PT Education - 04/21/15 1930    Education provided Yes   Education Details OTAGO HEP. PT also printed out information for [facility]) as this is close to pt's home. PT educated pt on the indoor track at the [facility], as pt reported he like to walk but it's too hot outside. PT also educated pt on [place] program at [facility], as pt would like to join fitness center but would like a program as he would like guidance for appropriate classes/exercises at gym (after PT is completed).   Person(s) Educated Patient   Methods Explanation;Demonstration;Tactile  cues;Handout;Verbal cues   Comprehension Verbalized understanding;Returned demonstration          PT Short Term Goals - 04/21/15 1936    PT SHORT TERM GOAL #1   Title Patient demonstrates understanding of initial HEP. (Target Date: 05/15/2015)   Time 4   Period Weeks   Status On-going   PT SHORT TERM GOAL #2   Title Chief Executive Officer Balance >34/56 (Target Date: 05/15/2015)   Time 4   Period Weeks   Status On-going   PT SHORT TERM GOAL #3   Title Patient ambulates 300' with single point cane with supervision. (Target Date: 05/15/2015)   Time 4   Period Weeks   Status On-going   PT SHORT TERM GOAL #4   Title Patient negotiates ramps & curbs with single point cane with contact assist. (Target Date: 05/15/2015)   Time 4   Period Weeks   Status On-going           PT Long Term Goals - 04/21/15 1936    PT  LONG TERM GOAL #1   Title Patient & wife verbalize & demonstrate updated HEP / fitness plan. (Target Date: 06/12/2015)   Time 8   Period Weeks   Status On-going   PT LONG TERM GOAL #2   Title Berg Balance >/= 40/56  (Target Date: 06/12/2015)   Time 8   Period Weeks   Status On-going   PT LONG TERM GOAL #3   Title Patient ambulates 300' with single point cane modified independent.  (Target Date: 06/12/2015)   Time 8   Period Weeks   Status On-going   PT LONG TERM GOAL #4   Title Patient negotiates ramp & curb with single point cane modified independent.  (Target Date: 06/12/2015)   Time 8   Period Weeks   Status On-going   PT LONG TERM GOAL #5   Title Patient ambulates 13' around furniture without device modified independent.  (Target Date: 06/12/2015)   Time 8   Period Weeks   Status On-going               Plan - 04/21/15 1932    Clinical Impression Statement Pt had difficulty hearing PT, as he reported B Meniere's disease and replacement hearing aides are ordered as he has difficulty with current hearing aides. Therefore, each exercise had to be demonstrated and repeated several times in order for pt to understand as he could not hear well. Pt very motivated to participate in PT and demonstrated good technique with minimal cues. Pt exhibits forward head and rounded shoulder posture, and would benefit from review of cervical retraction and the addition of B scapular retraction to promote improved posture.  Continue with POC.   Pt will benefit from skilled therapeutic intervention in order to improve on the following deficits Abnormal gait;Decreased activity tolerance;Decreased balance;Decreased endurance;Decreased mobility;Decreased safety awareness;Decreased strength   Rehab Potential Good   Clinical Impairments Affecting Rehab Potential Bilateral Meniere's disease   PT Frequency 2x / week   PT Duration 8 weeks   PT Treatment/Interventions ADLs/Self Care Home Management;DME  Instruction;Gait training;Stair training;Functional mobility training;Therapeutic activities;Therapeutic exercise;Balance training;Neuromuscular re-education;Patient/family education   PT Next Visit Plan Finish OTAGO and provide walking test and practice floor transfers.   Consulted and Agree with Plan of Care Patient        Problem List Patient Active Problem List   Diagnosis Date Noted  . CAP (community acquired pneumonia) 01/27/2015  . Sepsis 01/27/2015  . Community acquired bacterial pneumonia 01/27/2015  .  Stroke   . Arthritis   . Carotid artery occlusion   . CVA (cerebral vascular accident)   . Hyperlipidemia   . Hypertension   . BPH (benign prostatic hyperplasia)   . Skin cancer   . Weakness   . Pain in limb-Right trunk 07/18/2013  . Occlusion and stenosis of carotid artery without mention of cerebral infarction 07/18/2013  . TIA (transient ischemic attack) 09/15/2011  . Diabetes mellitus without complication 54/00/8676  . PERSONAL HISTORY OF COLONIC POLYPS 10/28/2009    Miller,Jennifer L 04/21/2015, 7:38 PM  [facility] [address] [place], [place], [address] Phone: [phone]   Fax:  [phone]     Geoffry Paradise, PT,DPT 04/21/2015 7:38 PM Phone: [phone] Fax: [phone]

## 2015-04-23 ENCOUNTER — Ambulatory Visit: Payer: Medicare Other | Admitting: Physical Therapy

## 2015-04-23 DIAGNOSIS — R269 Unspecified abnormalities of gait and mobility: Secondary | ICD-10-CM

## 2015-04-23 DIAGNOSIS — R2681 Unsteadiness on feet: Secondary | ICD-10-CM

## 2015-04-23 NOTE — Patient Instructions (Signed)
HIP: Hamstrings - Short Sitting   Rest leg on raised surface. Keep knee straight. Lift chest. Hold _30__ seconds. __4_ reps per side.  Copyright  VHI. All rights reserved.

## 2015-04-23 NOTE — Therapy (Signed)
[facility] [address] [place], [place], [address] Phone: [phone]   Fax:  [phone]  Physical Therapy Treatment  Patient Details  Name: Gregory Padilla MRN: [id number] Date of Birth: [birth year] Referring Provider:  Morton Peters.*  Encounter Date: 04/23/2015      PT End of Session - 04/23/15 1632    Visit Number 4   Number of Visits 17   Date for PT Re-Evaluation 06/12/15   Authorization Type Medicare - G Codes every 10 visits   PT Start Time 1400   PT Stop Time 1450   PT Time Calculation (min) 50 min   Activity Tolerance Patient tolerated treatment well   Behavior During Therapy [facility] for tasks assessed/performed      Past Medical History  Diagnosis Date  . Diabetes mellitus age 81    no insulin  . Stroke   . Arthritis   . Carotid artery occlusion   . CVA (cerebral vascular accident)     left sided weakness  . Hyperlipidemia   . Hypertension   . Meniere disease   . Degenerative joint disease   . BPH (benign prostatic hyperplasia)   . Skin cancer     history of skin cancer  . Coccidioidomycosis     Past Surgical History  Procedure Laterality Date  . Cholecystectomy    . Prostate surgery      x2  . Back surgery  2004    There were no vitals filed for this visit.  Visit Diagnosis:  Unsteadiness  Abnormality of gait      Subjective Assessment - 04/23/15 1641    Subjective "I've been having more and more trouble remembering things the past 2 or 3 weeks. I'm scared because my wife has Alzheimer's and I'm her caregiver." Denies falls. Has been performing Otago exercises "several times per day" since last session.   Pertinent History Longstanding Meniere's disease in B ears; CVA x5 (last stroke in April 2016); speak slowly, as pt reads lips due to hearing impairment.   Patient Stated Goals To improve balance,    Currently in Pain? No/denies          Neuro Re-ed: - Pt  performed Otago exercises provided in previous PT session, requiring min to mod cueing for proper technique/alignment (focus on cervical spine retraction and hip ABD). Continued instruction of [place] HEP with max verbal and demonstration cueing required (due to significant hearing impairment) for proper technique. Modification of squatting exercise to decrease quad dominance was effective in mitigating knee pain.  Therapeutic Exercises: Added B hamstring self-stretch to HEP due to cramping of R hamstring after hamstring curls x20. Pt required min cueing for proper technique with stretch 4 x30-second holds. See Patient Instructions for more info.                     Balance Exercises - 04/23/15 1414    OTAGO PROGRAM   Head Movements Standing   Neck Movements Standing   Back Extension Standing   Trunk Movements Standing   Ankle Movements Standing   Knee Extensor 10 reps;Weight (comment)  2.5 lb cuff weights on ankles   Knee Flexor 10 reps;20 reps  kept HEP at 10 reps due to R hamstring cramping after 20   Hip ABductor Other reps (comment)  15   Ankle Plantorflexors 20 reps, support  finger tip support   Ankle Dorsiflexors 20 reps, support  finger tip support   Knee Bends  10 reps, support  modified to decrease quad dominance due to knee pain           PT Education - 04/23/15 1633    Education provided Yes   Education Details Reviewed Otago exercise provided in previous session and continued through [place] program. Added hamstring stretch to HEP due to pt-reported cramping with hamstring strengthening. Recommended pt contact primary MD regarding recent cognitive changes.   Person(s) Educated Patient   Methods Explanation;Demonstration;Verbal cues;Handout   Comprehension Verbalized understanding;Returned demonstration          PT Short Term Goals - 04/21/15 1936    PT SHORT TERM GOAL #1   Title Patient demonstrates understanding of initial HEP. (Target Date:  05/15/2015)   Time 4   Period Weeks   Status On-going   PT SHORT TERM GOAL #2   Title Chief Executive Officer Balance >34/56 (Target Date: 05/15/2015)   Time 4   Period Weeks   Status On-going   PT SHORT TERM GOAL #3   Title Patient ambulates 300' with single point cane with supervision. (Target Date: 05/15/2015)   Time 4   Period Weeks   Status On-going   PT SHORT TERM GOAL #4   Title Patient negotiates ramps & curbs with single point cane with contact assist. (Target Date: 05/15/2015)   Time 4   Period Weeks   Status On-going           PT Long Term Goals - 04/21/15 1936    PT LONG TERM GOAL #1   Title Patient & wife verbalize & demonstrate updated HEP / fitness plan. (Target Date: 06/12/2015)   Time 8   Period Weeks   Status On-going   PT LONG TERM GOAL #2   Title Berg Balance >/= 40/56  (Target Date: 06/12/2015)   Time 8   Period Weeks   Status On-going   PT LONG TERM GOAL #3   Title Patient ambulates 300' with single point cane modified independent.  (Target Date: 06/12/2015)   Time 8   Period Weeks   Status On-going   PT LONG TERM GOAL #4   Title Patient negotiates ramp & curb with single point cane modified independent.  (Target Date: 06/12/2015)   Time 8   Period Weeks   Status On-going   PT LONG TERM GOAL #5   Title Patient ambulates 31' around furniture without device modified independent.  (Target Date: 06/12/2015)   Time 8   Period Weeks   Status On-going               Plan - 04/23/15 1636    Clinical Impression Statement Pt required min to mod verbal/demonstration cueing to perform former [place] HEP. Continued instruction of [place] today. Added hamstring stretches to HEP due to hamstring cramping with Otago today. Pt very compliant and motivated. Continue per POC.   Pt will benefit from skilled therapeutic intervention in order to improve on the following deficits Abnormal gait;Decreased activity tolerance;Decreased balance;Decreased endurance;Decreased mobility;Decreased safety  awareness;Decreased strength   Rehab Potential Good   Clinical Impairments Affecting Rehab Potential Bilateral Meniere's disease   PT Frequency 2x / week   PT Duration 8 weeks   PT Treatment/Interventions ADLs/Self Care Home Management;DME Instruction;Gait training;Stair training;Functional mobility training;Therapeutic activities;Therapeutic exercise;Balance training;Neuromuscular re-education;Patient/family education   PT Next Visit Plan Finish OTAGO and provide walking program and practice floor transfers.   Consulted and Agree with Plan of Care Patient        Problem List Patient Active Problem List  Diagnosis Date Noted  . CAP (community acquired pneumonia) 01/27/2015  . Sepsis 01/27/2015  . Community acquired bacterial pneumonia 01/27/2015  . Stroke   . Arthritis   . Carotid artery occlusion   . CVA (cerebral vascular accident)   . Hyperlipidemia   . Hypertension   . BPH (benign prostatic hyperplasia)   . Skin cancer   . Weakness   . Pain in limb-Right trunk 07/18/2013  . Occlusion and stenosis of carotid artery without mention of cerebral infarction 07/18/2013  . TIA (transient ischemic attack) 09/15/2011  . Diabetes mellitus without complication 43/32/9518  . PERSONAL HISTORY OF COLONIC POLYPS 10/28/2009    Billie Ruddy, PT, DPT [facility] [address] [place], [place], [address] Phone: [phone]   Fax:  [phone] 04/23/2015, 4:45 PM

## 2015-04-28 ENCOUNTER — Ambulatory Visit: Payer: Medicare Other | Admitting: Physical Therapy

## 2015-04-28 ENCOUNTER — Encounter: Payer: Self-pay | Admitting: Physical Therapy

## 2015-04-28 DIAGNOSIS — R269 Unspecified abnormalities of gait and mobility: Secondary | ICD-10-CM | POA: Diagnosis not present

## 2015-04-28 DIAGNOSIS — R2689 Other abnormalities of gait and mobility: Secondary | ICD-10-CM

## 2015-04-28 DIAGNOSIS — R278 Other lack of coordination: Secondary | ICD-10-CM

## 2015-04-28 DIAGNOSIS — R2681 Unsteadiness on feet: Secondary | ICD-10-CM

## 2015-04-28 DIAGNOSIS — R279 Unspecified lack of coordination: Secondary | ICD-10-CM

## 2015-04-28 NOTE — Therapy (Signed)
[facility] [address] [place], [place], [address] Phone: [phone]   Fax:  [phone]  Physical Therapy Treatment  Patient Details  Name: Gregory Padilla MRN: [id number] Date of Birth: Oct 16, 1924 Referring Provider:  Morton Peters.*  Encounter Date: 04/28/2015      PT End of Session - 04/28/15 1445    Visit Number 5   Number of Visits 17   Date for PT Re-Evaluation 06/12/15   Authorization Type Medicare - G Codes every 10 visits   PT Start Time 0321   PT Stop Time 1530   PT Time Calculation (min) 45 min   Activity Tolerance Patient tolerated treatment well   Behavior During Therapy [facility] for tasks assessed/performed      Past Medical History  Diagnosis Date  . Diabetes mellitus age 81    no insulin  . Stroke   . Arthritis   . Carotid artery occlusion   . CVA (cerebral vascular accident)     left sided weakness  . Hyperlipidemia   . Hypertension   . Meniere disease   . Degenerative joint disease   . BPH (benign prostatic hyperplasia)   . Skin cancer     history of skin cancer  . Coccidioidomycosis     Past Surgical History  Procedure Laterality Date  . Cholecystectomy    . Prostate surgery      x2  . Back surgery  2004    There were no vitals filed for this visit.  Visit Diagnosis:  Unsteadiness  Abnormality of gait  Decreased coordination  Balance problems      Subjective Assessment - 04/28/15 1451    Subjective He did exercises but they wore him out for next 2 days.    Currently in Pain? No/denies     Therapeutic Exercise: PT reviewed [place] program with verbal & written (wrote on his program) cues on how to perform each exercise correctly & safely. Patient demonstrated & verbalized better understanding.                             PT Education - 04/28/15 1445    Education provided Yes   Education Details Reviewed [place] exercises, PT removed  exercises with no UE support, PT instructed to perform 4-6 exercises at time then continue thru entire program during each day   Person(s) Educated Patient   Methods Explanation;Demonstration;Verbal cues;Handout   Comprehension Verbalized understanding;Returned demonstration;Verbal cues required;Need further instruction          PT Short Term Goals - 04/21/15 1936    PT SHORT TERM GOAL #1   Title Patient demonstrates understanding of initial HEP. (Target Date: 05/15/2015)   Time 4   Period Weeks   Status On-going   PT SHORT TERM GOAL #2   Title Chief Executive Officer Balance >34/56 (Target Date: 05/15/2015)   Time 4   Period Weeks   Status On-going   PT SHORT TERM GOAL #3   Title Patient ambulates 300' with single point cane with supervision. (Target Date: 05/15/2015)   Time 4   Period Weeks   Status On-going   PT SHORT TERM GOAL #4   Title Patient negotiates ramps & curbs with single point cane with contact assist. (Target Date: 05/15/2015)   Time 4   Period Weeks   Status On-going           PT Long Term Goals - 04/21/15 1936  PT LONG TERM GOAL #1   Title Patient & wife verbalize & demonstrate updated HEP / fitness plan. (Target Date: 06/12/2015)   Time 8   Period Weeks   Status On-going   PT LONG TERM GOAL #2   Title Berg Balance >/= 40/56  (Target Date: 06/12/2015)   Time 8   Period Weeks   Status On-going   PT LONG TERM GOAL #3   Title Patient ambulates 300' with single point cane modified independent.  (Target Date: 06/12/2015)   Time 8   Period Weeks   Status On-going   PT LONG TERM GOAL #4   Title Patient negotiates ramp & curb with single point cane modified independent.  (Target Date: 06/12/2015)   Time 8   Period Weeks   Status On-going   PT LONG TERM GOAL #5   Title Patient ambulates 13' around furniture without device modified independent.  (Target Date: 06/12/2015)   Time 8   Period Weeks   Status On-going               Plan - 04/28/15 1445    Clinical Impression  Statement Patient has better understanding of [place] exercises and recommendation to break up entire program throughout the day. Patient's balance appears better than at evaluation when this PT last worked with him.   Pt will benefit from skilled therapeutic intervention in order to improve on the following deficits Abnormal gait;Decreased activity tolerance;Decreased balance;Decreased endurance;Decreased mobility;Decreased safety awareness;Decreased strength   Rehab Potential Good   Clinical Impairments Affecting Rehab Potential Bilateral Meniere's disease   PT Frequency 2x / week   PT Duration 8 weeks   PT Treatment/Interventions ADLs/Self Care Home Management;DME Instruction;Gait training;Stair training;Functional mobility training;Therapeutic activities;Therapeutic exercise;Balance training;Neuromuscular re-education;Patient/family education   PT Next Visit Plan Review hamstring & heelcord stretches, floor transfers, gait   Consulted and Agree with Plan of Care Patient        Problem List Patient Active Problem List   Diagnosis Date Noted  . CAP (community acquired pneumonia) 01/27/2015  . Sepsis 01/27/2015  . Community acquired bacterial pneumonia 01/27/2015  . Stroke   . Arthritis   . Carotid artery occlusion   . CVA (cerebral vascular accident)   . Hyperlipidemia   . Hypertension   . BPH (benign prostatic hyperplasia)   . Skin cancer   . Weakness   . Pain in limb-Right trunk 07/18/2013  . Occlusion and stenosis of carotid artery without mention of cerebral infarction 07/18/2013  . TIA (transient ischemic attack) 09/15/2011  . Diabetes mellitus without complication 79/35/5732  . PERSONAL HISTORY OF COLONIC POLYPS 10/28/2009    Glennette Galster PT, DPT 04/28/2015, 5:46 PM  [facility] [address] [place], [place], [address] Phone: [phone]   Fax:  [phone]

## 2015-04-30 ENCOUNTER — Encounter: Payer: Self-pay | Admitting: Physical Therapy

## 2015-04-30 ENCOUNTER — Ambulatory Visit: Payer: Medicare Other | Admitting: Physical Therapy

## 2015-04-30 DIAGNOSIS — R2689 Other abnormalities of gait and mobility: Secondary | ICD-10-CM

## 2015-04-30 DIAGNOSIS — R269 Unspecified abnormalities of gait and mobility: Secondary | ICD-10-CM | POA: Diagnosis not present

## 2015-04-30 DIAGNOSIS — R278 Other lack of coordination: Secondary | ICD-10-CM

## 2015-04-30 DIAGNOSIS — R279 Unspecified lack of coordination: Secondary | ICD-10-CM

## 2015-04-30 DIAGNOSIS — R2681 Unsteadiness on feet: Secondary | ICD-10-CM

## 2015-04-30 NOTE — Patient Instructions (Signed)
HIP: Hamstrings - Short Sitting   Sit with leg out straight. Keep knee straight. Lift chest. Lean forward. Hold _30__ seconds. __2_ reps on each leg.  _7__ days per week  Copyright  VHI. All rights reserved.  ANKLE: Dorsiflexion - Sitting   Sitting, place strap / towel around foot. Pull foot toward body, keeping heel on floor. Do this stretch both with knee bent and with knee straight.  Use towel to pull foot up towards you. Hold __30_ seconds. _2__ reps on each leg both with knee bent and with knee straight.  _7__ days per week  Copyright  VHI. All rights reserved.

## 2015-05-01 NOTE — Therapy (Signed)
[facility] [address] [place], [place], [address] Phone: [phone]   Fax:  [phone]  Physical Therapy Treatment  Patient Details  Name: Gregory Padilla MRN: [id number] Date of Birth: 04/12/1925 Referring Provider:  Morton Padilla.*  Encounter Date: 04/30/2015      PT End of Session - 04/30/15 1445    Visit Number 6   Number of Visits 17   Date for PT Re-Evaluation 06/12/15   Authorization Type Medicare - G Codes every 10 visits   PT Start Time 8527   PT Stop Time 1530   PT Time Calculation (min) 45 min   Equipment Utilized During Treatment Gait belt   Activity Tolerance Patient tolerated treatment well   Behavior During Therapy [facility] for tasks assessed/performed      Past Medical History  Diagnosis Date  . Diabetes mellitus age 52    no insulin  . Stroke   . Arthritis   . Carotid artery occlusion   . CVA (cerebral vascular accident)     left sided weakness  . Hyperlipidemia   . Hypertension   . Meniere disease   . Degenerative joint disease   . BPH (benign prostatic hyperplasia)   . Skin cancer     history of skin cancer  . Coccidioidomycosis     Past Surgical History  Procedure Laterality Date  . Cholecystectomy    . Prostate surgery      x2  . Back surgery  2004    There were no vitals filed for this visit.  Visit Diagnosis:  Unsteadiness  Abnormality of gait  Decreased coordination  Balance problems      Subjective Assessment - 04/30/15 1450    Subjective Went to doctor due to mouth soreness and doctor thinks he may have "thrush"   Currently in Pain? No/denies      Therapeutic Exercise: PT instructed in hamstring & heelcord stretches. See pt education & instructions.  Therapeutic Activities: PT demo / instructed in floor transfers pushing on mat table. Patient return demo understanding with verbal cues. He needs further instruction.  Neuromuscular Re-Education  in parallel bars with mirror for visual feedback, verbal / tactile /demo cues on posture, movement patterns & righting reaction initially with BUEs, then RUE, then LUE support. Alternating stepping over 4" low profile beam forward, backward, sideways; stepping to side with hip rotation loading wt then progressed to incorporating this movement into stepping / walking to right & to left.                             PT Short Term Goals - 04/21/15 1936    PT SHORT TERM GOAL #1   Title Patient demonstrates understanding of initial HEP. (Target Date: 05/15/2015)   Time 4   Period Weeks   Status On-going   PT SHORT TERM GOAL #2   Title Chief Executive Officer Balance >34/56 (Target Date: 05/15/2015)   Time 4   Period Weeks   Status On-going   PT SHORT TERM GOAL #3   Title Patient ambulates 300' with single point cane with supervision. (Target Date: 05/15/2015)   Time 4   Period Weeks   Status On-going   PT SHORT TERM GOAL #4   Title Patient negotiates ramps & curbs with single point cane with contact assist. (Target Date: 05/15/2015)   Time 4   Period Weeks   Status On-going  PT Long Term Goals - 04/21/15 1936    PT LONG TERM GOAL #1   Title Patient & wife verbalize & demonstrate updated HEP / fitness plan. (Target Date: 06/12/2015)   Time 8   Period Weeks   Status On-going   PT LONG TERM GOAL #2   Title Berg Balance >/= 40/56  (Target Date: 06/12/2015)   Time 8   Period Weeks   Status On-going   PT LONG TERM GOAL #3   Title Patient ambulates 300' with single point cane modified independent.  (Target Date: 06/12/2015)   Time 8   Period Weeks   Status On-going   PT LONG TERM GOAL #4   Title Patient negotiates ramp & curb with single point cane modified independent.  (Target Date: 06/12/2015)   Time 8   Period Weeks   Status On-going   PT LONG TERM GOAL #5   Title Patient ambulates 12' around furniture without device modified independent.  (Target Date: 06/12/2015)   Time 8    Period Weeks   Status On-going               Plan - 04/30/15 1445    Clinical Impression Statement Patient seems to understand hamstring & heelcord stretches that should improve balance. Patient improved BLE movements and wt shift with instruction and progressing repetitive activities.   Pt will benefit from skilled therapeutic intervention in order to improve on the following deficits Abnormal gait;Decreased activity tolerance;Decreased balance;Decreased endurance;Decreased mobility;Decreased safety awareness;Decreased strength   Rehab Potential Good   Clinical Impairments Affecting Rehab Potential Bilateral Meniere's disease   PT Frequency 2x / week   PT Duration 8 weeks   PT Treatment/Interventions ADLs/Self Care Home Management;DME Instruction;Gait training;Stair training;Functional mobility training;Therapeutic activities;Therapeutic exercise;Balance training;Neuromuscular re-education;Patient/family education   PT Next Visit Plan Neuromuscular Re-education / balance activities followed with gait.    Consulted and Agree with Plan of Care Patient        Problem List Patient Active Problem List   Diagnosis Date Noted  . CAP (community acquired pneumonia) 01/27/2015  . Sepsis 01/27/2015  . Community acquired bacterial pneumonia 01/27/2015  . Stroke   . Arthritis   . Carotid artery occlusion   . CVA (cerebral vascular accident)   . Hyperlipidemia   . Hypertension   . BPH (benign prostatic hyperplasia)   . Skin cancer   . Weakness   . Pain in limb-Right trunk 07/18/2013  . Occlusion and stenosis of carotid artery without mention of cerebral infarction 07/18/2013  . TIA (transient ischemic attack) 09/15/2011  . Diabetes mellitus without complication 16/07/9603  . PERSONAL HISTORY OF COLONIC POLYPS 10/28/2009    Avonne Berkery PT, DPT  05/01/2015, 12:23 PM  [facility] [address] [place], [place],  [address] Phone: [phone]   Fax:  [phone]

## 2015-05-05 ENCOUNTER — Ambulatory Visit: Payer: Medicare Other | Admitting: Physical Therapy

## 2015-05-07 ENCOUNTER — Ambulatory Visit: Payer: Medicare Other

## 2015-05-07 DIAGNOSIS — R2681 Unsteadiness on feet: Secondary | ICD-10-CM

## 2015-05-07 DIAGNOSIS — R269 Unspecified abnormalities of gait and mobility: Secondary | ICD-10-CM | POA: Diagnosis not present

## 2015-05-07 NOTE — Therapy (Signed)
[facility] [address] [place], [place], [address] Phone: [phone]   Fax:  [phone]  Physical Therapy Treatment  Patient Details  Name: Gregory Padilla MRN: [id number] Date of Birth: 1925-06-30 Referring Provider:  Morton Peters.*  Encounter Date: 05/07/2015      PT End of Session - 05/07/15 1751    Visit Number 7   Number of Visits 17   Date for PT Re-Evaluation 06/12/15   Authorization Type Medicare - G Codes every 10 visits   PT Start Time 1446   PT Stop Time 1527   PT Time Calculation (min) 41 min   Equipment Utilized During Treatment Gait belt   Activity Tolerance Patient tolerated treatment well   Behavior During Therapy [facility] for tasks assessed/performed      Past Medical History  Diagnosis Date  . Diabetes mellitus age 81    no insulin  . Stroke   . Arthritis   . Carotid artery occlusion   . CVA (cerebral vascular accident)     left sided weakness  . Hyperlipidemia   . Hypertension   . Meniere disease   . Degenerative joint disease   . BPH (benign prostatic hyperplasia)   . Skin cancer     history of skin cancer  . Coccidioidomycosis     Past Surgical History  Procedure Laterality Date  . Cholecystectomy    . Prostate surgery      x2  . Back surgery  2004    There were no vitals filed for this visit.  Visit Diagnosis:  Abnormality of gait  Unsteadiness      Subjective Assessment - 05/07/15 1454    Subjective Pt denied changes since last visit. Pt did report he had a few near fall, while crossing feet while walking.   Pertinent History Longstanding Meniere's disease in B ears; CVA x5 (last stroke in April 2016); speak slowly, as pt reads lips due to hearing impairment.   Patient Stated Goals To improve balance,    Currently in Pain? No/denies                         [facility] Adult PT Treatment/Exercise - 05/07/15 1455    Ambulation/Gait   Ambulation/Gait Yes   Ambulation/Gait Assistance 5: Supervision;4: Min guard   Ambulation/Gait Assistance Details Pt ambulated in // bars with board placed in middle to decrease pt's narrow BOS. Pt then ambulated around track with one foot on blue tile and one on white tile to improve narrow BOS. Cues for sequencing with SPC, improved stride length, improved heel strike, and to decrease narrow BOS.   Ambulation Distance (Feet) --  230', 117', 4x8' in // bars   Assistive device None;Straight cane   Gait Pattern Narrow base of support;Poor foot clearance - left;Trunk flexed;Step-through pattern;Trendelenburg;Lateral trunk lean to right;Decreased stride length   Ambulation Surface Level;Indoor   Balance   Balance Assessed Yes   Dynamic Standing Balance   Dynamic Standing - Balance Support Bilateral upper extremity supported;Right upper extremity supported;No upper extremity supported   Dynamic Standing - Level of Assistance 4: Min assist;Other (comment)  min guard   Dynamic Standing - Balance Activities Other (comment)   Dynamic Standing - Comments In // bars: pt performed x10 heel dot taps to improve lateral weight shifting and heel strike with 0-1 UE support. Pt performed ant. stepping and weight shifting over 1/2" and 1" foam beam to improve heel strike,  stride length, and ant/lateral weight shifting, x10 with B UE support x10 with one UE support and x10 with no UE. Pt required occasional min A to maintain balance when not using UE for support.    Exercises   Exercises Knee/Hip   Knee/Hip Exercises: Sidelying   Clams 3x10 with B LEs. Pt required tactile and VC's for technique and to keep hips forward. Cues to improve eccentric control, especially for L clamshells.                PT Education - 05/07/15 1751    Education provided Yes   Education Details Clamshells   Person(s) Educated Patient   Methods Explanation;Demonstration;Tactile cues;Verbal cues;Handout   Comprehension  Verbalized understanding;Returned demonstration          PT Short Term Goals - 04/21/15 1936    PT SHORT TERM GOAL #1   Title Patient demonstrates understanding of initial HEP. (Target Date: 05/15/2015)   Time 4   Period Weeks   Status On-going   PT SHORT TERM GOAL #2   Title Chief Executive Officer Balance >34/56 (Target Date: 05/15/2015)   Time 4   Period Weeks   Status On-going   PT SHORT TERM GOAL #3   Title Patient ambulates 300' with single point cane with supervision. (Target Date: 05/15/2015)   Time 4   Period Weeks   Status On-going   PT SHORT TERM GOAL #4   Title Patient negotiates ramps & curbs with single point cane with contact assist. (Target Date: 05/15/2015)   Time 4   Period Weeks   Status On-going           PT Long Term Goals - 04/21/15 1936    PT LONG TERM GOAL #1   Title Patient & wife verbalize & demonstrate updated HEP / fitness plan. (Target Date: 06/12/2015)   Time 8   Period Weeks   Status On-going   PT LONG TERM GOAL #2   Title Berg Balance >/= 40/56  (Target Date: 06/12/2015)   Time 8   Period Weeks   Status On-going   PT LONG TERM GOAL #3   Title Patient ambulates 300' with single point cane modified independent.  (Target Date: 06/12/2015)   Time 8   Period Weeks   Status On-going   PT LONG TERM GOAL #4   Title Patient negotiates ramp & curb with single point cane modified independent.  (Target Date: 06/12/2015)   Time 8   Period Weeks   Status On-going   PT LONG TERM GOAL #5   Title Patient ambulates 47' around furniture without device modified independent.  (Target Date: 06/12/2015)   Time 8   Period Weeks   Status On-going               Plan - 05/07/15 1751    Clinical Impression Statement Pt demonstrated narrow BOS with B LEs occasionally crossing midline most likely due to weak hip abductors. PT added clamshells to pt's HEP in order to improve glut med. strength. Continue with POC.   Pt will benefit from skilled therapeutic intervention in order to  improve on the following deficits Abnormal gait;Decreased activity tolerance;Decreased balance;Decreased endurance;Decreased mobility;Decreased safety awareness;Decreased strength   Rehab Potential Good   Clinical Impairments Affecting Rehab Potential Bilateral Meniere's disease   PT Frequency 2x / week   PT Duration 8 weeks   PT Treatment/Interventions ADLs/Self Care Home Management;DME Instruction;Gait training;Stair training;Functional mobility training;Therapeutic activities;Therapeutic exercise;Balance training;Neuromuscular re-education;Patient/family education   PT Next Visit Plan  Check STGs and add additional visits.   Consulted and Agree with Plan of Care Patient        Problem List Patient Active Problem List   Diagnosis Date Noted  . CAP (community acquired pneumonia) 01/27/2015  . Sepsis 01/27/2015  . Community acquired bacterial pneumonia 01/27/2015  . Stroke   . Arthritis   . Carotid artery occlusion   . CVA (cerebral vascular accident)   . Hyperlipidemia   . Hypertension   . BPH (benign prostatic hyperplasia)   . Skin cancer   . Weakness   . Pain in limb-Right trunk 07/18/2013  . Occlusion and stenosis of carotid artery without mention of cerebral infarction 07/18/2013  . TIA (transient ischemic attack) 09/15/2011  . Diabetes mellitus without complication 73/40/3709  . PERSONAL HISTORY OF COLONIC POLYPS 10/28/2009    Keary Hanak L 05/07/2015, 5:54 PM  [facility] [address] [place], [place], [address] Phone: [phone]   Fax:  [phone]     Geoffry Paradise, PT,DPT 05/07/2015 5:54 PM Phone: [phone] Fax: [phone]

## 2015-05-07 NOTE — Patient Instructions (Signed)
Abduction: Clam  - Side-Lying   Lie on side with knees bent. Lift top knee, keeping feet together. Keep trunk steady. Slowly lower leg. _10__ reps per set, _3__ sets per day, _3-4__ days per week. Perform with both legs.   Copyright  VHI. All rights reserved.

## 2015-05-12 ENCOUNTER — Encounter: Payer: Self-pay | Admitting: Physical Therapy

## 2015-05-12 ENCOUNTER — Ambulatory Visit: Payer: Medicare Other | Admitting: Physical Therapy

## 2015-05-12 VITALS — BP 144/68 | HR 77

## 2015-05-12 DIAGNOSIS — R279 Unspecified lack of coordination: Secondary | ICD-10-CM | POA: Diagnosis present

## 2015-05-12 DIAGNOSIS — R2689 Other abnormalities of gait and mobility: Secondary | ICD-10-CM

## 2015-05-12 DIAGNOSIS — R269 Unspecified abnormalities of gait and mobility: Secondary | ICD-10-CM | POA: Insufficient documentation

## 2015-05-12 DIAGNOSIS — R29818 Other symptoms and signs involving the nervous system: Secondary | ICD-10-CM | POA: Insufficient documentation

## 2015-05-12 DIAGNOSIS — R2681 Unsteadiness on feet: Secondary | ICD-10-CM | POA: Insufficient documentation

## 2015-05-13 NOTE — Therapy (Signed)
[facility] [address] [place], [place], [address] Phone: [phone]   Fax:  [phone]  Physical Therapy Treatment  Patient Details  Name: Gregory Padilla MRN: [id number] Date of Birth: [birth year] Referring Provider:  Morton Peters.*  Encounter Date: 05/12/2015      PT End of Session - 05/12/15 1445    Visit Number 8   Number of Visits 17   Date for PT Re-Evaluation 06/12/15   Authorization Type Medicare - G Codes every 10 visits   PT Start Time 1446   PT Stop Time 1530   PT Time Calculation (min) 44 min   Equipment Utilized During Treatment Gait belt   Activity Tolerance Patient tolerated treatment well   Behavior During Therapy [facility] for tasks assessed/performed      Past Medical History  Diagnosis Date  . Diabetes mellitus age 65    no insulin  . Stroke   . Arthritis   . Carotid artery occlusion   . CVA (cerebral vascular accident)     left sided weakness  . Hyperlipidemia   . Hypertension   . Meniere disease   . Degenerative joint disease   . BPH (benign prostatic hyperplasia)   . Skin cancer     history of skin cancer  . Coccidioidomycosis     Past Surgical History  Procedure Laterality Date  . Cholecystectomy    . Prostate surgery      x2  . Back surgery  2004    Filed Vitals:   05/12/15 1457 05/12/15 1511  BP: 144/68   Pulse: 61 77  SpO2: 99% 98%    Visit Diagnosis:  Abnormality of gait  Unsteadiness  Balance problems      Subjective Assessment - 05/12/15 1455    Subjective He reports feeling short of breath and off balance for last three days.    Currently in Pain? No/denies            [facility] PT Assessment - 05/12/15 1445    Berg Balance Test   Sit to Stand Able to stand without using hands and stabilize independently   Standing Unsupported Able to stand safely 2 minutes   Sitting with Back Unsupported but Feet Supported on Floor or Stool Able to sit  safely and securely 2 minutes   Stand to Sit Sits safely with minimal use of hands   Transfers Able to transfer safely, minor use of hands   Standing Unsupported with Eyes Closed Able to stand 10 seconds safely   Standing Ubsupported with Feet Together Able to place feet together independently and stand for 1 minute with supervision   From Standing, Reach Forward with Outstretched Arm Can reach forward >5 cm safely (2")   From Standing Position, Pick up Object from Floor Able to pick up shoe, needs supervision   From Standing Position, Turn to Look Behind Over each Shoulder Turn sideways only but maintains balance   Turn 360 Degrees Needs close supervision or verbal cueing   Standing Unsupported, Alternately Place Feet on Step/Stool Able to complete >2 steps/needs minimal assist   Standing Unsupported, One Foot in Front Able to take small step independently and hold 30 seconds   Standing on One Leg Tries to lift leg/unable to hold 3 seconds but remains standing independently   Total Score 39                     [facility] Adult PT Treatment/Exercise - 05/12/15  1445    Ambulation/Gait   Ambulation/Gait Yes   Ambulation/Gait Assistance 5: Supervision;4: Min guard   Ambulation Distance (Feet) 250 Feet   Assistive device None;Straight cane   Gait Pattern Narrow base of support;Poor foot clearance - left;Trunk flexed;Step-through pattern;Trendelenburg;Lateral trunk lean to right;Decreased stride length   Ambulation Surface Indoor;Level   Ramp 4: Min assist  min guard with single point cane   Ramp Details (indicate cue type and reason) verbal cues on posture, wt shift & step length   Curb 4: Min assist  Min guard with single point cane   Curb Details (indicate cue type and reason) cues on posture & step thru with 2nd leg   Balance   Balance Assessed Yes   Dynamic Standing Balance   Dynamic Standing - Balance Support --   Dynamic Standing - Level of Assistance --   Dynamic Standing  - Balance Activities --   Exercises   Exercises Knee/Hip   Knee/Hip Exercises: Sidelying   Clams --          Self-Care: See patient education      PT Education - 05/12/15 1445    Education provided Yes   Education Details if symptoms continue then he needs to see his doctor,    Person(s) Educated Patient   Methods Explanation   Comprehension Verbalized understanding          PT Short Term Goals - 05/12/15 1445    PT SHORT TERM GOAL #1   Title Patient demonstrates understanding of initial HEP. (Target Date: 05/15/2015)   Baseline MET 05/12/2015   Time 4   Period Weeks   Status Achieved   PT SHORT TERM GOAL #2   Title Berg Balance >34/56 (Target Date: 05/15/2015)   Baseline MET Berg Balance 39/56   Time 4   Period Weeks   Status Achieved   PT SHORT TERM GOAL #3   Title Patient ambulates 300' with single point cane with supervision. (Target Date: 05/15/2015)   Baseline Partially MET 05/12/2015  patient ambulates with SPC with supervison but only tolerated 250' before mild SOB & fatigue.   Time 4   Period Weeks   Status Partially Met   PT SHORT TERM GOAL #4   Title Patient negotiates ramps & curbs with single point cane with contact assist. (Target Date: 05/15/2015)   Baseline MET 05/12/2015   Time 4   Period Weeks   Status Achieved           PT Long Term Goals - 04/21/15 1936    PT LONG TERM GOAL #1   Title Patient & wife verbalize & demonstrate updated HEP / fitness plan. (Target Date: 06/12/2015)   Time 8   Period Weeks   Status On-going   PT LONG TERM GOAL #2   Title Berg Balance >/= 40/56  (Target Date: 06/12/2015)   Time 8   Period Weeks   Status On-going   PT LONG TERM GOAL #3   Title Patient ambulates 300' with single point cane modified independent.  (Target Date: 06/12/2015)   Time 8   Period Weeks   Status On-going   PT LONG TERM GOAL #4   Title Patient negotiates ramp & curb with single point cane modified independent.  (Target Date: 06/12/2015)   Time 8    Period Weeks   Status On-going   PT LONG TERM GOAL #5   Title Patient ambulates 35' around furniture without device modified independent.  (Target Date: 06/12/2015)   Time  8   Period Weeks   Status On-going               Plan - 05/12/15 1445    Clinical Impression Statement Patient reported chronic issue with bowels and he has only had a very small bowel movement in last 2-3 days. He plans to follow his doctor recommended process at home tonight. If this does not relieve his funny feeling, then he will call the doctor in the morning. Patient met or partially MET all STGs.                                          Pt will benefit from skilled therapeutic intervention in order to improve on the following deficits Abnormal gait;Decreased activity tolerance;Decreased balance;Decreased endurance;Decreased mobility;Decreased safety awareness;Decreased strength   Rehab Potential Good   Clinical Impairments Affecting Rehab Potential Bilateral Meniere's disease   PT Frequency 2x / week   PT Duration 8 weeks   PT Treatment/Interventions ADLs/Self Care Home Management;DME Instruction;Gait training;Stair training;Functional mobility training;Therapeutic activities;Therapeutic exercise;Balance training;Neuromuscular re-education;Patient/family education   PT Next Visit Plan continue towards LTGs   Consulted and Agree with Plan of Care Patient        Problem List Patient Active Problem List   Diagnosis Date Noted  . CAP (community acquired pneumonia) 01/27/2015  . Sepsis 01/27/2015  . Community acquired bacterial pneumonia 01/27/2015  . Stroke   . Arthritis   . Carotid artery occlusion   . CVA (cerebral vascular accident)   . Hyperlipidemia   . Hypertension   . BPH (benign prostatic hyperplasia)   . Skin cancer   . Weakness   . Pain in limb-Right trunk 07/18/2013  . Occlusion and stenosis of carotid artery without mention of cerebral infarction 07/18/2013  . TIA (transient ischemic  attack) 09/15/2011  . Diabetes mellitus without complication 15/40/0867  . PERSONAL HISTORY OF COLONIC POLYPS 10/28/2009    Jamey Reas PT, DPT 05/13/2015, 2:23 PM  [facility] [address] [place], [place], [address] Phone: [phone]   Fax:  [phone]

## 2015-05-14 ENCOUNTER — Ambulatory Visit: Payer: Medicare Other | Admitting: Physical Therapy

## 2015-05-14 DIAGNOSIS — R2681 Unsteadiness on feet: Secondary | ICD-10-CM

## 2015-05-14 DIAGNOSIS — R269 Unspecified abnormalities of gait and mobility: Secondary | ICD-10-CM

## 2015-05-14 NOTE — Therapy (Signed)
[facility] [address] [place], [place], [address] Phone: [phone]   Fax:  [phone]  Physical Therapy Treatment  Patient Details  Name: Gregory Padilla MRN: [id number] Date of Birth: 07/29/1925 Referring Provider:  Morton Peters.*  Encounter Date: 05/14/2015      PT End of Session - 05/14/15 1626    Visit Number 9   Number of Visits 17   Date for PT Re-Evaluation 06/12/15   Authorization Type Medicare - G Codes every 10 visits   PT Start Time 1449   PT Stop Time 1532   PT Time Calculation (min) 43 min   Equipment Utilized During Treatment Gait belt   Activity Tolerance Patient tolerated treatment well   Behavior During Therapy [facility] for tasks assessed/performed      Past Medical History  Diagnosis Date  . Diabetes mellitus age 81    no insulin  . Stroke   . Arthritis   . Carotid artery occlusion   . CVA (cerebral vascular accident)     left sided weakness  . Hyperlipidemia   . Hypertension   . Meniere disease   . Degenerative joint disease   . BPH (benign prostatic hyperplasia)   . Skin cancer     history of skin cancer  . Coccidioidomycosis     Past Surgical History  Procedure Laterality Date  . Cholecystectomy    . Prostate surgery      x2  . Back surgery  2004    There were no vitals filed for this visit.  Visit Diagnosis:  Abnormality of gait  Unsteadiness      Subjective Assessment - 05/14/15 1456    Subjective Pt states, "You would not believe how many times I almost fell yesterday. I was so off-balance. I'm so scared of falling when I have days like that." Pt also reports recent difficulty seeing, stating "I just found out I have a cataract on one eye." Pt reporting pain in R flank which has been intermittent for the past year. Pt has an appt at the [facility] on 9/20 to address this pain.   Patient is accompained by: Family member   Pertinent History Longstanding Meniere's  disease in B ears; CVA x5 (last stroke in April 2016); speak slowly, as pt reads lips due to hearing impairment.   Patient Stated Goals To improve balance,    Currently in Pain? Yes   Pain Score 2    Pain Location Abdomen   Pain Orientation Right   Pain Descriptors / Indicators Sharp   Pain Type Chronic pain   Pain Onset More than a month ago  "about a year"   Pain Frequency Intermittent                         [facility] Adult PT Treatment/Exercise - 05/14/15 0001    Ambulation/Gait   Ambulation/Gait Yes   Ambulation/Gait Assistance 5: Supervision;4: Min guard   Ambulation/Gait Assistance Details Cueing for use of compensatory strategy for impaired VOR (eyes then head then body) during turning. Pt did exhibit effective within-session carryover and exhibited improved gait stability during turning with this technique.   Ambulation Distance (Feet) 460 Feet   Assistive device Straight cane   Gait Pattern Narrow base of support;Poor foot clearance - left;Trunk flexed;Step-through pattern;Lateral trunk lean to right;Decreased stride length;Scissoring  scissoring x2 episodes when turning   Ambulation Surface Level;Indoor   Ramp 4: Min assist  Ramp Details (indicate cue type and reason) min guard using SPC   Curb 4: Min assist   Curb Details (indicate cue type and reason) x3 trials with cueing for sequencing of cane, steps.   Self-Care   Self-Care Other Self-Care Comments   Other Self-Care Comments  Discussed fall prevention strategies with focus on the importance of creating a multisensory environment (e.g. ensuring adequate lighting and using RW over unlevel ground). Emphasized that pt will have increasingly more difficulty walking over unlevel ground or in poorly-lit areas due to suspected bilateral vestibular loss (due to h/o Meniere's) as well as recent onset of cataract.   Neuro Re-ed    Neuro Re-ed Details  Standing with back to corner with chair in front of pt, pt  performed visual exercise to facilitate pt use of compensatory strategy for impaired VOR.  Blocked practice of instructing pt to turn eyes then head toward visual targets on wall; progressed to turning eyes, then head, then body.  Purpose of exercise: to increase gait stability when turning                PT Education - 05/14/15 1624    Education provided Yes   Education Details Fall prevention strategies with emphasis on multisensory environment due to recent worsening of vision in addition to vestibular loss. Use of compensatory strategy for VOR with turning while walking.   Person(s) Educated Patient   Methods Explanation;Demonstration;Verbal cues;Handout   Comprehension Verbalized understanding;Returned demonstration          PT Short Term Goals - 05/12/15 1445    PT SHORT TERM GOAL #1   Title Patient demonstrates understanding of initial HEP. (Target Date: 05/15/2015)   Baseline MET 05/12/2015   Time 4   Period Weeks   Status Achieved   PT SHORT TERM GOAL #2   Title Berg Balance >34/56 (Target Date: 05/15/2015)   Baseline MET Berg Balance 39/56   Time 4   Period Weeks   Status Achieved   PT SHORT TERM GOAL #3   Title Patient ambulates 300' with single point cane with supervision. (Target Date: 05/15/2015)   Baseline Partially MET 05/12/2015  patient ambulates with SPC with supervison but only tolerated 250' before mild SOB & fatigue.   Time 4   Period Weeks   Status Partially Met   PT SHORT TERM GOAL #4   Title Patient negotiates ramps & curbs with single point cane with contact assist. (Target Date: 05/15/2015)   Baseline MET 05/12/2015   Time 4   Period Weeks   Status Achieved           PT Long Term Goals - 04/21/15 1936    PT LONG TERM GOAL #1   Title Patient & wife verbalize & demonstrate updated HEP / fitness plan. (Target Date: 06/12/2015)   Time 8   Period Weeks   Status On-going   PT LONG TERM GOAL #2   Title Berg Balance >/= 40/56  (Target Date: 06/12/2015)    Time 8   Period Weeks   Status On-going   PT LONG TERM GOAL #3   Title Patient ambulates 300' with single point cane modified independent.  (Target Date: 06/12/2015)   Time 8   Period Weeks   Status On-going   PT LONG TERM GOAL #4   Title Patient negotiates ramp & curb with single point cane modified independent.  (Target Date: 06/12/2015)   Time 8   Period Weeks   Status On-going   PT  LONG TERM GOAL #5   Title Patient ambulates 35' around furniture without device modified independent.  (Target Date: 06/12/2015)   Time 8   Period Weeks   Status On-going               Plan - 05/14/15 1627    Clinical Impression Statement Focused on use of compensatory strategies for impaired VOR to increase gait stability with turning. Also addressed fall prevention strategies; focused on multisensory environment. Continue per POC.   Pt will benefit from skilled therapeutic intervention in order to improve on the following deficits Abnormal gait;Decreased activity tolerance;Decreased balance;Decreased endurance;Decreased mobility;Decreased safety awareness;Decreased strength   Clinical Impairments Affecting Rehab Potential Bilateral Meniere's disease   PT Frequency 2x / week   PT Duration 8 weeks   PT Treatment/Interventions ADLs/Self Care Home Management;DME Instruction;Gait training;Stair training;Functional mobility training;Therapeutic activities;Therapeutic exercise;Balance training;Neuromuscular re-education;Patient/family education   PT Next Visit Plan GCODES. Continue with balance and gait training.   Consulted and Agree with Plan of Care Patient        Problem List Patient Active Problem List   Diagnosis Date Noted  . CAP (community acquired pneumonia) 01/27/2015  . Sepsis 01/27/2015  . Community acquired bacterial pneumonia 01/27/2015  . Stroke   . Arthritis   . Carotid artery occlusion   . CVA (cerebral vascular accident)   . Hyperlipidemia   . Hypertension   . BPH (benign  prostatic hyperplasia)   . Skin cancer   . Weakness   . Pain in limb-Right trunk 07/18/2013  . Occlusion and stenosis of carotid artery without mention of cerebral infarction 07/18/2013  . TIA (transient ischemic attack) 09/15/2011  . Diabetes mellitus without complication 19/69/4098  . PERSONAL HISTORY OF COLONIC POLYPS 10/28/2009    Billie Ruddy, PT, DPT [facility] [address] [place], [place], [address] Phone: [phone]   Fax:  [phone] 05/14/2015, 4:30 PM

## 2015-05-14 NOTE — Patient Instructions (Signed)
Compensatory Strategies: Corrective Saccades   1. Holding two stationary targets placed ___12_ inches apart, move eyes to target, keep head still. 2. Then move head in direction of target while eyes remain on target. 3. Repeat in opposite direction. Perform standing with back to corner with a stable chair in front of you. (as practiced in therapy).  Do this 20 times total per day. Think about finding a target to look at when turning while walking.   Fall Prevention and Home Safety Falls cause injuries and can affect all age groups. It is possible to use preventive measures to significantly decrease the likelihood of falls. There are many simple measures which can make your home safer and prevent falls. OUTDOORS  Repair cracks and edges of walkways and driveways.  Remove high doorway thresholds.  Trim shrubbery on the main path into your home.  Have good outside lighting.  Clear walkways of tools, rocks, debris, and clutter.  Check that handrails are not broken and are securely fastened. Both sides of steps should have handrails.  Have leaves, snow, and ice cleared regularly.  Use sand or salt on walkways during winter months.  In the garage, clean up grease or oil spills. BATHROOM  Install night lights.  Install grab bars by the toilet and in the tub and shower.  Use non-skid mats or decals in the tub or shower.  Place a plastic non-slip stool in the shower to sit on, if needed.  Keep floors dry and clean up all water on the floor immediately.  Remove soap buildup in the tub or shower on a regular basis.  Secure bath mats with non-slip, double-sided rug tape.  Remove throw rugs and tripping hazards from the floors. BEDROOMS  Install night lights.  Make sure a bedside light is easy to reach.  Do not use oversized bedding.  Keep a telephone by your bedside.  Have a firm chair with side arms to use for getting dressed.  Remove throw rugs and tripping hazards  from the floor. KITCHEN  Keep handles on pots and pans turned toward the center of the stove. Use back burners when possible.  Clean up spills quickly and allow time for drying.  Avoid walking on wet floors.  Avoid hot utensils and knives.  Position shelves so they are not too high or low.  Place commonly used objects within easy reach.  If necessary, use a sturdy step stool with a grab bar when reaching.  Keep electrical cables out of the way.  Do not use floor polish or wax that makes floors slippery. If you must use wax, use non-skid floor wax.  Remove throw rugs and tripping hazards from the floor. STAIRWAYS  Never leave objects on stairs.  Place handrails on both sides of stairways and use them. Fix any loose handrails. Make sure handrails on both sides of the stairways are as long as the stairs.  Check carpeting to make sure it is firmly attached along stairs. Make repairs to worn or loose carpet promptly.  Avoid placing throw rugs at the top or bottom of stairways, or properly secure the rug with carpet tape to prevent slippage. Get rid of throw rugs, if possible.  Have an electrician put in a light switch at the top and bottom of the stairs. OTHER FALL PREVENTION TIPS  Wear low-heel or rubber-soled shoes that are supportive and fit well. Wear closed toe shoes.  When using a stepladder, make sure it is fully opened and both spreaders are  firmly locked. Do not climb a closed stepladder.  Add color or contrast paint or tape to grab bars and handrails in your home. Place contrasting color strips on first and last steps.  Learn and use mobility aids as needed. Install an electrical emergency response system.  Turn on lights to avoid dark areas. Replace light bulbs that burn out immediately. Get light switches that glow.  Arrange furniture to create clear pathways. Keep furniture in the same place.  Firmly attach carpet with non-skid or double-sided tape.  Eliminate  uneven floor surfaces.  Select a carpet pattern that does not visually hide the edge of steps.  Be aware of all pets. OTHER HOME SAFETY TIPS  Set the water temperature for 120 F (48.8 C).  Keep emergency numbers on or near the telephone.  Keep smoke detectors on every level of the home and near sleeping areas. Document Released: 09/16/2002 Document Revised: 03/27/2012 Document Reviewed: 12/16/2011 [facility] Patient Information 2015 [place], [place]. This information is not intended to replace advice given to you by your health care provider. Make sure you discuss any questions you have with your health care provider.    Copyright  VHI. All rights reserved.

## 2015-05-22 ENCOUNTER — Ambulatory Visit: Payer: Medicare Other

## 2015-05-22 DIAGNOSIS — R2681 Unsteadiness on feet: Secondary | ICD-10-CM

## 2015-05-22 DIAGNOSIS — R2689 Other abnormalities of gait and mobility: Secondary | ICD-10-CM

## 2015-05-22 DIAGNOSIS — R269 Unspecified abnormalities of gait and mobility: Secondary | ICD-10-CM

## 2015-05-22 NOTE — Therapy (Addendum)
[facility] [address] [place], [place], [address] Phone: [phone]   Fax:  [phone]  Physical Therapy Treatment  Patient Details  Name: Gregory Padilla MRN: [id number] Date of Birth: 27-Feb-1925 Referring Provider:  Morton Peters.*  Encounter Date: 05/22/2015      PT End of Session - 05/22/15 1636    Visit Number 10   Number of Visits 17   Date for PT Re-Evaluation 06/12/15   Authorization Type Medicare - G Codes every 10 visits   PT Start Time 1401   PT Stop Time 1443   PT Time Calculation (min) 42 min   Equipment Utilized During Treatment Gait belt   Activity Tolerance Patient tolerated treatment well   Behavior During Therapy [facility] for tasks assessed/performed      Past Medical History  Diagnosis Date  . Diabetes mellitus age 63    no insulin  . Stroke   . Arthritis   . Carotid artery occlusion   . CVA (cerebral vascular accident)     left sided weakness  . Hyperlipidemia   . Hypertension   . Meniere disease   . Degenerative joint disease   . BPH (benign prostatic hyperplasia)   . Skin cancer     history of skin cancer  . Coccidioidomycosis     Past Surgical History  Procedure Laterality Date  . Cholecystectomy    . Prostate surgery      x2  . Back surgery  2004    There were no vitals filed for this visit.  Visit Diagnosis:  Abnormality of gait  Unsteadiness  Balance problems      Subjective Assessment - 05/22/15 1404    Subjective Pt almost fell in the bathroom this morning, but he caught himself. Pt not sure what he was doing.    Patient is accompained by: Family member   Pertinent History Longstanding Meniere's disease in B ears; CVA x5 (last stroke in April 2016); speak slowly, as pt reads lips due to hearing impairment.   Patient Stated Goals To improve balance,    Currently in Pain? No/denies                         [facility] Adult PT  Treatment/Exercise - 05/22/15 1406    Ambulation/Gait   Ambulation/Gait Yes   Ambulation/Gait Assistance 5: Supervision   Ambulation/Gait Assistance Details Cues for sequencing, as pt does not use SPC at all times during amb, he hold it to the side.   Ambulation Distance (Feet) --  230', 50'x2   Assistive device Straight cane   Gait Pattern Narrow base of support;Poor foot clearance - left;Trunk flexed;Step-through pattern;Lateral trunk lean to right;Decreased stride length;Scissoring   Ambulation Surface Level;Indoor   Standardized Balance Assessment   Standardized Balance Assessment Berg Balance Test   Berg Balance Test   Sit to Stand Able to stand without using hands and stabilize independently   Standing Unsupported Able to stand safely 2 minutes   Sitting with Back Unsupported but Feet Supported on Floor or Stool Able to sit safely and securely 2 minutes   Stand to Sit Sits safely with minimal use of hands   Transfers Able to transfer safely, minor use of hands   Standing Unsupported with Eyes Closed Able to stand 10 seconds with supervision   Standing Ubsupported with Feet Together Able to place feet together independently and stand for 1 minute with supervision   From  Standing, Reach Forward with Outstretched Arm Can reach confidently >25 cm (10")   From Standing Position, Pick up Object from Floor Able to pick up shoe, needs supervision   From Standing Position, Turn to Look Behind Over each Shoulder Looks behind from both sides and weight shifts well   Turn 360 Degrees Able to turn 360 degrees safely but slowly   Standing Unsupported, Alternately Place Feet on Step/Stool Able to complete >2 steps/needs minimal assist   Standing Unsupported, One Foot in Front Able to take small step independently and hold 30 seconds   Standing on One Leg Able to lift leg independently and hold equal to or more than 3 seconds  4 seconds   Total Score 44   High Level Balance   High Level Balance  Activities Head turns  ambulating forwards with head turns   High Level Balance Comments Pt ambulated in // bars with 0-1 UE support and min guard to min A to maintain balance. Pt noted to experience decrease stride length and increased postural sway during amb. with head turns. Pt also ambulated with external pertubations and PT had pt lean in ant/post. directions and then let pt go (while guarding) to elicit stepping strategy to maintain balance.                PT Education - 05/22/15 1635    Education provided Yes   Education Details BERG goal progress.   Person(s) Educated Patient   Methods Explanation   Comprehension Verbalized understanding          PT Short Term Goals - 05/12/15 1445    PT SHORT TERM GOAL #1   Title Patient demonstrates understanding of initial HEP. (Target Date: 05/15/2015)   Baseline MET 05/12/2015   Time 4   Period Weeks   Status Achieved   PT SHORT TERM GOAL #2   Title Berg Balance >34/56 (Target Date: 05/15/2015)   Baseline MET Berg Balance 39/56   Time 4   Period Weeks   Status Achieved   PT SHORT TERM GOAL #3   Title Patient ambulates 300' with single point cane with supervision. (Target Date: 05/15/2015)   Baseline Partially MET 05/12/2015  patient ambulates with SPC with supervison but only tolerated 250' before mild SOB & fatigue.   Time 4   Period Weeks   Status Partially Met   PT SHORT TERM GOAL #4   Title Patient negotiates ramps & curbs with single point cane with contact assist. (Target Date: 05/15/2015)   Baseline MET 05/12/2015   Time 4   Period Weeks   Status Achieved           PT Long Term Goals - 05/22/15 1638    PT LONG TERM GOAL #1   Title Patient & wife verbalize & demonstrate updated HEP / fitness plan. (Target Date: 06/12/2015)   Time 8   Period Weeks   Status On-going   PT LONG TERM GOAL #2   Title Berg Balance >/= 48/56  (Target Date: 06/12/2015)   Baseline 05/22/15: pt scored 44/56 so goal revised from 40/56 to 48/56.    Time 8   Period Weeks   Status Revised   PT LONG TERM GOAL #3   Title Patient ambulates 300' with single point cane modified independent.  (Target Date: 06/12/2015)   Time 8   Period Weeks   Status On-going   PT LONG TERM GOAL #4   Title Patient negotiates ramp & curb with single point cane  modified independent.  (Target Date: 06/12/2015)   Time 8   Period Weeks   Status On-going   PT LONG TERM GOAL #5   Title Patient ambulates 28' around furniture without device modified independent.  (Target Date: 06/12/2015)   Time 8   Period Weeks   Status On-going               Plan - 11-Jun-2015 1636    Clinical Impression Statement Pt demonstrated progress, as he improved BERG score to 44/56 which still indicates pt is at risk for falls. PT revised LTG BERG goal, due to pt's progress. Pt continues to demonstrate trendelenberg gait during amb., especially without SPC. PT reiterated the importance of using SPC for balance and to continue clamshell HEP to strengthen glut med. Continue with POC.   Pt will benefit from skilled therapeutic intervention in order to improve on the following deficits Abnormal gait;Decreased activity tolerance;Decreased balance;Decreased endurance;Decreased mobility;Decreased safety awareness;Decreased strength   Rehab Potential Good   Clinical Impairments Affecting Rehab Potential Bilateral Meniere's disease   PT Frequency 2x / week   PT Duration 8 weeks   PT Treatment/Interventions ADLs/Self Care Home Management;DME Instruction;Gait training;Stair training;Functional mobility training;Therapeutic activities;Therapeutic exercise;Balance training;Neuromuscular re-education;Patient/family education   PT Next Visit Plan Pt would like to try weight machines, as he joined a gym. Continue with balance (external pertubations) and gait training.   Consulted and Agree with Plan of Care Patient          G-Codes - 2015-06-11 1423    Functional Assessment Tool Used Berg Balance  44/56   Functional Limitation Mobility: Walking and moving around   Mobility: Walking and Moving Around Current Status [phone]) At least 40 percent but less than 60 percent impaired, limited or restricted   Mobility: Walking and Moving Around Goal Status [phone]) At least 40 percent but less than 60 percent impaired, limited or restricted      Problem List Patient Active Problem List   Diagnosis Date Noted  . CAP (community acquired pneumonia) 01/27/2015  . Sepsis 01/27/2015  . Community acquired bacterial pneumonia 01/27/2015  . Stroke   . Arthritis   . Carotid artery occlusion   . CVA (cerebral vascular accident)   . Hyperlipidemia   . Hypertension   . BPH (benign prostatic hyperplasia)   . Skin cancer   . Weakness   . Pain in limb-Right trunk 07/18/2013  . Occlusion and stenosis of carotid artery without mention of cerebral infarction 07/18/2013  . TIA (transient ischemic attack) 09/15/2011  . Diabetes mellitus without complication 76/73/4193  . PERSONAL HISTORY OF COLONIC POLYPS 10/28/2009    Sherilynn Dieu L 06-11-15, 4:39 PM  [facility] [address] [place], [place], [address] Phone: [phone]   Fax:  [phone]   Physical Therapy Progress Note  Dates of Reporting Period:  to Jun 11, 2015  Objective Reports of Subjective Statement: Pt reports he still feels off balance, however, his BERG score has improved to 44/56.  Objective Measurements: BERG: 44/56.  Goal Update:      PT Short Term Goals - 05/12/15 1445    PT SHORT TERM GOAL #1   Title Patient demonstrates understanding of initial HEP. (Target Date: 05/15/2015)   Baseline MET 05/12/2015   Time 4   Period Weeks   Status Achieved   PT SHORT TERM GOAL #2   Title Berg Balance >34/56 (Target Date: 05/15/2015)   Baseline MET Berg Balance 39/56   Time 4   Period Weeks  Status Achieved   PT SHORT TERM GOAL #3   Title Patient ambulates 300' with  single point cane with supervision. (Target Date: 05/15/2015)   Baseline Partially MET 05/12/2015  patient ambulates with SPC with supervison but only tolerated 250' before mild SOB & fatigue.   Time 4   Period Weeks   Status Partially Met   PT SHORT TERM GOAL #4   Title Patient negotiates ramps & curbs with single point cane with contact assist. (Target Date: 05/15/2015)   Baseline MET 05/12/2015   Time 4   Period Weeks   Status Achieved       Plan: Continue with strengthening and balance.  Reason Skilled Services are Required: To improve safety during functional mobility.    Geoffry Paradise, PT,DPT 05/22/2015 4:39 PM Phone: [phone] Fax: [phone]

## 2015-05-26 ENCOUNTER — Ambulatory Visit: Payer: Medicare Other

## 2015-05-26 DIAGNOSIS — R279 Unspecified lack of coordination: Secondary | ICD-10-CM

## 2015-05-26 DIAGNOSIS — R278 Other lack of coordination: Secondary | ICD-10-CM

## 2015-05-26 DIAGNOSIS — R269 Unspecified abnormalities of gait and mobility: Secondary | ICD-10-CM | POA: Diagnosis not present

## 2015-05-26 DIAGNOSIS — R2681 Unsteadiness on feet: Secondary | ICD-10-CM

## 2015-05-26 DIAGNOSIS — R2689 Other abnormalities of gait and mobility: Secondary | ICD-10-CM

## 2015-05-26 NOTE — Therapy (Signed)
[facility] [address] [place], [place], [address] Phone: [phone]   Fax:  [phone]  Physical Therapy Treatment  Patient Details  Name: Gregory Padilla MRN: [id number] Date of Birth: 01-Jun-1925 Referring Provider:  Morton Peters.*  Encounter Date: 05/26/2015      PT End of Session - 05/26/15 1726    Visit Number 11   Number of Visits 17   Date for PT Re-Evaluation 06/12/15   Authorization Type Medicare - G Codes every 10 visits   PT Start Time 4536   PT Stop Time 1613   PT Time Calculation (min) 40 min   Equipment Utilized During Treatment Gait belt   Activity Tolerance Patient tolerated treatment well   Behavior During Therapy [facility] for tasks assessed/performed      Past Medical History  Diagnosis Date  . Diabetes mellitus age 80    no insulin  . Stroke   . Arthritis   . Carotid artery occlusion   . CVA (cerebral vascular accident)     left sided weakness  . Hyperlipidemia   . Hypertension   . Meniere disease   . Degenerative joint disease   . BPH (benign prostatic hyperplasia)   . Skin cancer     history of skin cancer  . Coccidioidomycosis     Past Surgical History  Procedure Laterality Date  . Cholecystectomy    . Prostate surgery      x2  . Back surgery  2004    There were no vitals filed for this visit.  Visit Diagnosis:  Abnormality of gait  Unsteadiness  Balance problems  Decreased coordination      Subjective Assessment - 05/26/15 1535    Subjective Pt reported he did not fall since last visit, but he almost fell yesterday while turning but caught his wife's walker.   Pertinent History Longstanding Meniere's disease in B ears; CVA x5 (last stroke in April 2016); speak slowly, as pt reads lips due to hearing impairment.   Patient Stated Goals To improve balance,    Currently in Pain? No/denies                         [facility] Adult PT  Treatment/Exercise - 05/26/15 1536    Transfers   Transfers Sit to Stand;Stand to Sit   Sit to Stand 5: Supervision;With upper extremity assist   Sit to Stand Details Tactile cues for sequencing;Verbal cues for sequencing;Tactile cues for placement;Verbal cues for technique   Sit to Stand Details (indicate cue type and reason) performed x5. cues for sequencing with rollator/RW   Stand to Sit 5: Supervision;Without upper extremity assist   Stand to Sit Details (indicate cue type and reason) Tactile cues for sequencing;Tactile cues for placement;Verbal cues for sequencing;Verbal cues for technique   Stand to Sit Details performed x5. Cues for sequencing with RW/rollator.   Ambulation/Gait   Ambulation/Gait Yes   Ambulation/Gait Assistance 5: Supervision;4: Min guard   Ambulation/Gait Assistance Details Min guard with SPC and supervision with RW and rollator. Pt required cues for sequencing, how to perform turns safely with RW, and to stay within RW/rollator.  Gait deviations improved with RW and rollator. No LOB or sway when ambulating with RW/rollator.   Ambulation Distance (Feet) --  32' with SPC, 230' with RW and 230' with rollator   Assistive device Rolling walker;Rollator;Straight cane   Gait Pattern Narrow base of support;Poor foot clearance - left;Trunk flexed;Step-through  pattern;Lateral trunk lean to right;Decreased stride length;Scissoring;Trendelenburg   Ambulation Surface Level;Indoor   Knee/Hip Exercises: Machines for Strengthening   Cybex Leg Press B LEs: Pt performed 1 set of 10 at 50 pounds and demonstrated good control and no L knee pain. Pt performed x10 at 75lbs. and 65 lbs, however, both weights increased L knee pain after approx. 5-7 reps. Cues for technique and to improve eccentric control.   Other Machine Seated with B UEs: pt performed 2x10 of lat pull downs and rows (middle trap/rhomboids). Cues for technique. All exercises performed with supervision to ensure safety.                 PT Education - 05/26/15 1724    Education provided Yes   Education Details PT educated pt on performing exercises in seated position at gym, after d/c from PT. PT also educated pt that weight/machines will be determined at d/c based on pt safety and one rep max (he will then perform 2 sets of 10 at 75-85% of 1 rep max).   Person(s) Educated Patient   Methods Explanation;Demonstration;Tactile cues;Verbal cues   Comprehension Verbalized understanding;Need further instruction;Returned demonstration;Verbal cues required;Tactile cues required          PT Short Term Goals - 05/12/15 1445    PT SHORT TERM GOAL #1   Title Patient demonstrates understanding of initial HEP. (Target Date: 05/15/2015)   Baseline MET 05/12/2015   Time 4   Period Weeks   Status Achieved   PT SHORT TERM GOAL #2   Title Berg Balance >34/56 (Target Date: 05/15/2015)   Baseline MET Berg Balance 39/56   Time 4   Period Weeks   Status Achieved   PT SHORT TERM GOAL #3   Title Patient ambulates 300' with single point cane with supervision. (Target Date: 05/15/2015)   Baseline Partially MET 05/12/2015  patient ambulates with SPC with supervison but only tolerated 250' before mild SOB & fatigue.   Time 4   Period Weeks   Status Partially Met   PT SHORT TERM GOAL #4   Title Patient negotiates ramps & curbs with single point cane with contact assist. (Target Date: 05/15/2015)   Baseline MET 05/12/2015   Time 4   Period Weeks   Status Achieved           PT Long Term Goals - 05/26/15 1729    PT LONG TERM GOAL #1   Title Patient & wife verbalize & demonstrate updated HEP / fitness plan. (Target Date: 06/12/2015)   Time 8   Period Weeks   Status On-going   PT LONG TERM GOAL #2   Title Berg Balance >/= 48/56  (Target Date: 06/12/2015)   Baseline 05/22/15: pt scored 44/56 so goal revised from 40/56 to 48/56.   Time 8   Period Weeks   Status On-going   PT LONG TERM GOAL #3   Title Patient ambulates 300'  with single point cane modified independent.  (Target Date: 06/12/2015)   Time 8   Period Weeks   Status On-going   PT LONG TERM GOAL #4   Title Patient negotiates ramp & curb with single point cane modified independent.  (Target Date: 06/12/2015)   Time 8   Period Weeks   Status On-going   PT LONG TERM GOAL #5   Title Patient ambulates 54' around furniture without device modified independent.  (Target Date: 06/12/2015)   Time 8   Period Weeks   Status On-going  Plan - 05/26/15 1726    Clinical Impression Statement Pt demonstrated improved balance and decreased trendenlenberg gait when ambulating with rollator and RW vs. SPC. Pt reported he prefers RW, as he has one at home vs. rollator. Pt was able to perform leg press with 75 pounds of resistance, however, this caused L knee pain, so PT decreased weight to 50 pounds. Continue with POC.   Pt will benefit from skilled therapeutic intervention in order to improve on the following deficits Abnormal gait;Decreased activity tolerance;Decreased balance;Decreased endurance;Decreased mobility;Decreased safety awareness;Decreased strength   Rehab Potential Good   Clinical Impairments Affecting Rehab Potential Bilateral Meniere's disease   PT Frequency 2x / week   PT Duration 8 weeks   PT Treatment/Interventions ADLs/Self Care Home Management;DME Instruction;Gait training;Stair training;Functional mobility training;Therapeutic activities;Therapeutic exercise;Balance training;Neuromuscular re-education;Patient/family education   PT Next Visit Plan Continue with balance (external pertubations) and gait training with RW.   Consulted and Agree with Plan of Care Patient        Problem List Patient Active Problem List   Diagnosis Date Noted  . CAP (community acquired pneumonia) 01/27/2015  . Sepsis 01/27/2015  . Community acquired bacterial pneumonia 01/27/2015  . Stroke   . Arthritis   . Carotid artery occlusion   . CVA  (cerebral vascular accident)   . Hyperlipidemia   . Hypertension   . BPH (benign prostatic hyperplasia)   . Skin cancer   . Weakness   . Pain in limb-Right trunk 07/18/2013  . Occlusion and stenosis of carotid artery without mention of cerebral infarction 07/18/2013  . TIA (transient ischemic attack) 09/15/2011  . Diabetes mellitus without complication 83/66/2947  . PERSONAL HISTORY OF COLONIC POLYPS 10/28/2009    Tamana Hatfield L 05/26/2015, 5:36 PM  [facility] [address] [place], [place], [address] Phone: [phone]   Fax:  [phone]     Geoffry Paradise, PT,DPT 05/26/2015 5:36 PM Phone: [phone] Fax: [phone]

## 2015-05-29 ENCOUNTER — Ambulatory Visit: Payer: Medicare Other | Admitting: Physical Therapy

## 2015-05-29 DIAGNOSIS — R269 Unspecified abnormalities of gait and mobility: Secondary | ICD-10-CM | POA: Diagnosis not present

## 2015-05-29 DIAGNOSIS — R2681 Unsteadiness on feet: Secondary | ICD-10-CM

## 2015-05-30 NOTE — Therapy (Signed)
[place] [facility] [place] [address] [place], [place], [address] Phone: [phone]   Fax:  [phone]  Physical Therapy Treatment  Patient Details  Name: Gregory Padilla MRN: [id number] Date of Birth: 08/30/1925 Referring Provider:  Morton Peters.*  Encounter Date: 05/29/2015      PT End of Session - 05/30/15 1130    Visit Number 12   Number of Visits 17   Date for PT Re-Evaluation 06/12/15   Authorization Type Medicare - G Codes every 10 visits   PT Start Time 1315   PT Stop Time 1358   PT Time Calculation (min) 43 min   Equipment Utilized During Treatment Gait belt   Activity Tolerance Patient tolerated treatment well   Behavior During Therapy [facility] for tasks assessed/performed      Past Medical History  Diagnosis Date  . Diabetes mellitus age 81    no insulin  . Stroke   . Arthritis   . Carotid artery occlusion   . CVA (cerebral vascular accident)     left sided weakness  . Hyperlipidemia   . Hypertension   . Meniere disease   . Degenerative joint disease   . BPH (benign prostatic hyperplasia)   . Skin cancer     history of skin cancer  . Coccidioidomycosis     Past Surgical History  Procedure Laterality Date  . Cholecystectomy    . Prostate surgery      x2  . Back surgery  2004    There were no vitals filed for this visit.  Visit Diagnosis:  Abnormality of gait  Unsteadiness      Subjective Assessment - 05/29/15 1321    Subjective "I'm feeling extra off-balance today. I've nearly fallen several times in the past week." Pt using cane today. When asked if pt willing to use a rolling walker, pt replied, "Now, that's a big commitment, but I might have to do it." Pt also stated, "I dropped a hearing aid battery on the floor the other day, so I got down on my knees and thought I would never get up."   Patient is accompained by: Family member   Pertinent History Longstanding Meniere's disease  in B ears; CVA x5 (last stroke in April 2016); speak slowly, as pt reads lips due to hearing impairment.   Patient Stated Goals To improve balance.   Currently in Pain? No/denies                         [facility] Adult PT Treatment/Exercise - 05/30/15 0001    Ambulation/Gait   Ambulation/Gait Yes   Ambulation/Gait Assistance 5: Supervision;4: Min guard   Ambulation/Gait Assistance Details Supervision with RW; min guard with SPC   Assistive device Rolling walker;Straight cane   Gait Pattern Narrow base of support;Trunk flexed;Step-through pattern;Decreased stride length;Scissoring;Trendelenburg   Ambulation Surface Level;Indoor   Ramp 5: Supervision   Ramp Details (indicate cue type and reason) x3 trials consectively with cueing for safe proximity of RW during ascent withi inconsistent within-session carryover   Curb 5: Supervision;4: Min assist   Curb Details (indicate cue type and reason) x3 consective trials; during initial trial, required cueing for sequencing with effective within-session carryover during remaining trials   Gait Comments Pt negotiated 30' obstacle course x2 trials with RW and supervision. Obstacles included cones (x3 consecutively with 3' between each) compliant surfaces, and obstacles requiring proper technique for lifting/lowering RW. During initial trial, pt required cueing  for the following: maintaining contact of RW with ground during sharp turns; ensuring feet are stationary while pt lifting/lowering RW over obstacles. Pt gave effective return demonstration during second trial.    Therapeutic Activites    Therapeutic Activities Other Therapeutic Activities   Other Therapeutic Activities Explained, demonstrated safe technique for transfer from seated on EOB <> tall kneeling on floor with BUE support at bed. Pt gave effective return demonstration of floor transfer with min cueing for technique.                PT Education - 05/30/15 1113     Education provided Yes   Education Details Reiterated recommendation for use of RW for all functional mobility. Obstacle negotiation with RW. Techniques for safely getting up from floor, per pt report of difficulty with this.   Person(s) Educated Patient   Methods Demonstration;Explanation   Comprehension Verbalized understanding;Returned demonstration          PT Short Term Goals - 05/12/15 1445    PT SHORT TERM GOAL #1   Title Patient demonstrates understanding of initial HEP. (Target Date: 05/15/2015)   Baseline MET 05/12/2015   Time 4   Period Weeks   Status Achieved   PT SHORT TERM GOAL #2   Title Berg Balance >34/56 (Target Date: 05/15/2015)   Baseline MET Berg Balance 39/56   Time 4   Period Weeks   Status Achieved   PT SHORT TERM GOAL #3   Title Patient ambulates 300' with single point cane with supervision. (Target Date: 05/15/2015)   Baseline Partially MET 05/12/2015  patient ambulates with SPC with supervison but only tolerated 250' before mild SOB & fatigue.   Time 4   Period Weeks   Status Partially Met   PT SHORT TERM GOAL #4   Title Patient negotiates ramps & curbs with single point cane with contact assist. (Target Date: 05/15/2015)   Baseline MET 05/12/2015   Time 4   Period Weeks   Status Achieved           PT Long Term Goals - 05/26/15 1729    PT LONG TERM GOAL #1   Title Patient & wife verbalize & demonstrate updated HEP / fitness plan. (Target Date: 06/12/2015)   Time 8   Period Weeks   Status On-going   PT LONG TERM GOAL #2   Title Berg Balance >/= 48/56  (Target Date: 06/12/2015)   Baseline 05/22/15: pt scored 44/56 so goal revised from 40/56 to 48/56.   Time 8   Period Weeks   Status On-going   PT LONG TERM GOAL #3   Title Patient ambulates 300' with single point cane modified independent.  (Target Date: 06/12/2015)   Time 8   Period Weeks   Status On-going   PT LONG TERM GOAL #4   Title Patient negotiates ramp & curb with single point cane modified  independent.  (Target Date: 06/12/2015)   Time 8   Period Weeks   Status On-going   PT LONG TERM GOAL #5   Title Patient ambulates 4' around furniture without device modified independent.  (Target Date: 06/12/2015)   Time 8   Period Weeks   Status On-going               Plan - 05/30/15 1131    Clinical Impression Statement During gait training with RW, pt exhibited effective within-session carryover of safe technique for obstacle negotiation with RW. Will need reinforcement of sequencing/technique of curb step negotiation with  RW. Continue per POC.   Pt will benefit from skilled therapeutic intervention in order to improve on the following deficits Abnormal gait;Decreased activity tolerance;Decreased balance;Decreased endurance;Decreased mobility;Decreased safety awareness;Decreased strength   Rehab Potential Good   Clinical Impairments Affecting Rehab Potential Bilateral Meniere's disease   PT Frequency 2x / week   PT Duration 8 weeks   PT Treatment/Interventions ADLs/Self Care Home Management;DME Instruction;Gait training;Stair training;Functional mobility training;Therapeutic activities;Therapeutic exercise;Balance training;Neuromuscular re-education;Patient/family education   PT Next Visit Plan Standing balance, dynamc gait, continued gait training with RW, curb step negotiation with RW. Pt continuing to request education on safe exercies to perform at gym (noted that this was addressed in previous PT session).   Consulted and Agree with Plan of Care Patient        Problem List Patient Active Problem List   Diagnosis Date Noted  . CAP (community acquired pneumonia) 01/27/2015  . Sepsis 01/27/2015  . Community acquired bacterial pneumonia 01/27/2015  . Stroke   . Arthritis   . Carotid artery occlusion   . CVA (cerebral vascular accident)   . Hyperlipidemia   . Hypertension   . BPH (benign prostatic hyperplasia)   . Skin cancer   . Weakness   . Pain in limb-Right trunk  07/18/2013  . Occlusion and stenosis of carotid artery without mention of cerebral infarction 07/18/2013  . TIA (transient ischemic attack) 09/15/2011  . Diabetes mellitus without complication 67/20/9198  . PERSONAL HISTORY OF COLONIC POLYPS 10/28/2009    Billie Ruddy, PT, DPT [facility] [address] [place], [place], [address] Phone: [phone]   Fax:  [phone] 05/30/2015, 11:37 AM

## 2015-06-01 ENCOUNTER — Ambulatory Visit: Payer: Medicare Other | Admitting: Physical Therapy

## 2015-06-01 ENCOUNTER — Encounter: Payer: Self-pay | Admitting: Physical Therapy

## 2015-06-01 DIAGNOSIS — R2689 Other abnormalities of gait and mobility: Secondary | ICD-10-CM

## 2015-06-01 DIAGNOSIS — R269 Unspecified abnormalities of gait and mobility: Secondary | ICD-10-CM

## 2015-06-01 DIAGNOSIS — R2681 Unsteadiness on feet: Secondary | ICD-10-CM

## 2015-06-01 NOTE — Therapy (Signed)
[place] [address] [place], [place], [address] Phone: [phone]   Fax:  [phone]  Physical Therapy Treatment  Patient Details  Name: Gregory Padilla MRN: [id number] Date of Birth: 26-Nov-1924 Referring Provider:  Morton Peters.*  Encounter Date: 06/01/2015      PT End of Session - 06/01/15 1400    Visit Number 13   Number of Visits 17   Date for PT Re-Evaluation 06/12/15   Authorization Type Medicare - G Codes every 10 visits   PT Start Time 1400   PT Stop Time 1445   PT Time Calculation (min) 45 min   Equipment Utilized During Treatment Gait belt   Activity Tolerance Patient tolerated treatment well   Behavior During Therapy [facility] for tasks assessed/performed      Past Medical History  Diagnosis Date  . Diabetes mellitus age 81    no insulin  . Stroke   . Arthritis   . Carotid artery occlusion   . CVA (cerebral vascular accident)     left sided weakness  . Hyperlipidemia   . Hypertension   . Meniere disease   . Degenerative joint disease   . BPH (benign prostatic hyperplasia)   . Skin cancer     history of skin cancer  . Coccidioidomycosis     Past Surgical History  Procedure Laterality Date  . Cholecystectomy    . Prostate surgery      x2  . Back surgery  2004    There were no vitals filed for this visit.  Visit Diagnosis:  Abnormality of gait  Unsteadiness  Balance problems      Subjective Assessment - 06/01/15 1400    Subjective "I have been using this walker but I don't like it."   Currently in Pain? No/denies                         [facility] Adult PT Treatment/Exercise - 06/01/15 1400    Ambulation/Gait   Ambulation/Gait Yes   Ambulation/Gait Assistance 5: Supervision   Ambulation/Gait Assistance Details verbal cues on posture, sequence and step length   Ambulation Distance (Feet) 500 Feet   Assistive device Rolling walker   Gait Pattern  Narrow base of support;Trunk flexed;Step-through pattern;Decreased stride length;Scissoring;Trendelenburg   Ambulation Surface Indoor;Level   Ramp 5: Supervision  RW   Ramp Details (indicate cue type and reason) cues on posture & step length   Curb 5: Supervision;4: Min assist   Curb Details (indicate cue type and reason) cues on technique, step thru with 2nd leg, posture             Balance Exercises - 06/01/15 1754    OT[place] PROGRAM   Backwards Walking Support   Walking and Turning Around Assistive device   Sideways Walking Assistive device   Tandem Stance 10 seconds, support   Tandem Walk Support   One Leg Stand 10 seconds, support   Heel Walking Support   Toe Walk Support   Sit to Stand 5 reps, one support   Overall OTAGO Comments PT added use of RW to walking exercises above           PT Education - 06/01/15 1400    Education provided Yes   Education Details PT reviewed [place] HEP and use of RW for all walking exercises, PT reviewed stretches & gaze stabilization exercises using his handout   Person(s) Educated Patient   Methods Explanation;Demonstration;Verbal cues;Handout  Comprehension Verbalized understanding;Returned demonstration;Verbal cues required;Need further instruction          PT Short Term Goals - 05/12/15 1445    PT SHORT TERM GOAL #1   Title Patient demonstrates understanding of initial HEP. (Target Date: 05/15/2015)   Baseline MET 05/12/2015   Time 4   Period Weeks   Status Achieved   PT SHORT TERM GOAL #2   Title Berg Balance >34/56 (Target Date: 05/15/2015)   Baseline MET Berg Balance 39/56   Time 4   Period Weeks   Status Achieved   PT SHORT TERM GOAL #3   Title Patient ambulates 300' with single point cane with supervision. (Target Date: 05/15/2015)   Baseline Partially MET 05/12/2015  patient ambulates with SPC with supervison but only tolerated 250' before mild SOB & fatigue.   Time 4   Period Weeks   Status Partially Met   PT SHORT TERM  GOAL #4   Title Patient negotiates ramps & curbs with single point cane with contact assist. (Target Date: 05/15/2015)   Baseline MET 05/12/2015   Time 4   Period Weeks   Status Achieved           PT Long Term Goals - 05/26/15 1729    PT LONG TERM GOAL #1   Title Patient & wife verbalize & demonstrate updated HEP / fitness plan. (Target Date: 06/12/2015)   Time 8   Period Weeks   Status On-going   PT LONG TERM GOAL #2   Title Berg Balance >/= 48/56  (Target Date: 06/12/2015)   Baseline 05/22/15: pt scored 44/56 so goal revised from 40/56 to 48/56.   Time 8   Period Weeks   Status On-going   PT LONG TERM GOAL #3   Title Patient ambulates 300' with single point cane modified independent.  (Target Date: 06/12/2015)   Time 8   Period Weeks   Status On-going   PT LONG TERM GOAL #4   Title Patient negotiates ramp & curb with single point cane modified independent.  (Target Date: 06/12/2015)   Time 8   Period Weeks   Status On-going   PT LONG TERM GOAL #5   Title Patient ambulates 33' around furniture without device modified independent.  (Target Date: 06/12/2015)   Time 8   Period Weeks   Status On-going               Plan - 06/01/15 1400    Clinical Impression Statement Patient has better understanding of HEP and recommendation to break it up during the day to avoid fatigue. Patient improved safety with negotiating ramps & curbs with RW with instruction & repetition.   Pt will benefit from skilled therapeutic intervention in order to improve on the following deficits Abnormal gait;Decreased activity tolerance;Decreased balance;Decreased endurance;Decreased mobility;Decreased safety awareness;Decreased strength   Rehab Potential Good   Clinical Impairments Affecting Rehab Potential Bilateral Meniere's disease   PT Frequency 2x / week   PT Duration 8 weeks   PT Treatment/Interventions ADLs/Self Care Home Management;DME Instruction;Gait training;Stair training;Functional mobility  training;Therapeutic activities;Therapeutic exercise;Balance training;Neuromuscular re-education;Patient/family education   PT Next Visit Plan Standing balance, dynamc gait, continued gait training with RW, curb step negotiation with RW. Pt continuing to request education on safe exercies to perform at gym (noted that this was addressed in previous PT session).   Consulted and Agree with Plan of Care Patient        Problem List Patient Active Problem List   Diagnosis Date Noted  .  CAP (community acquired pneumonia) 01/27/2015  . Sepsis 01/27/2015  . Community acquired bacterial pneumonia 01/27/2015  . Stroke   . Arthritis   . Carotid artery occlusion   . CVA (cerebral vascular accident)   . Hyperlipidemia   . Hypertension   . BPH (benign prostatic hyperplasia)   . Skin cancer   . Weakness   . Pain in limb-Right trunk 07/18/2013  . Occlusion and stenosis of carotid artery without mention of cerebral infarction 07/18/2013  . TIA (transient ischemic attack) 09/15/2011  . Diabetes mellitus without complication 01/75/1025  . PERSONAL HISTORY OF COLONIC POLYPS 10/28/2009    Jamey Reas PT, DPT 06/01/2015, 6:06 PM  [place] [address] [place], [place], [address] Phone: [phone]   Fax:  [phone]

## 2015-06-03 ENCOUNTER — Encounter: Payer: Self-pay | Admitting: Physical Therapy

## 2015-06-03 ENCOUNTER — Ambulatory Visit: Payer: Medicare Other | Admitting: Physical Therapy

## 2015-06-03 DIAGNOSIS — R278 Other lack of coordination: Secondary | ICD-10-CM

## 2015-06-03 DIAGNOSIS — R269 Unspecified abnormalities of gait and mobility: Secondary | ICD-10-CM | POA: Diagnosis not present

## 2015-06-03 DIAGNOSIS — R2689 Other abnormalities of gait and mobility: Secondary | ICD-10-CM

## 2015-06-03 DIAGNOSIS — R2681 Unsteadiness on feet: Secondary | ICD-10-CM

## 2015-06-03 DIAGNOSIS — R279 Unspecified lack of coordination: Secondary | ICD-10-CM

## 2015-06-03 NOTE — Therapy (Signed)
[place] [address] [place], [place], [address] Phone: [phone]   Fax:  [phone]  Physical Therapy Treatment  Patient Details  Name: Gregory Padilla MRN: [id number] Date of Birth: 05-May-1925 Referring Provider:  Morton Peters.*  Encounter Date: 06/03/2015      PT End of Session - 06/03/15 1445    Visit Number 14   Number of Visits 17   Date for PT Re-Evaluation 06/12/15   Authorization Type Medicare - G Codes every 10 visits   PT Start Time 7517   PT Stop Time 1530   PT Time Calculation (min) 45 min   Equipment Utilized During Treatment Gait belt   Activity Tolerance Patient tolerated treatment well   Behavior During Therapy [facility] for tasks assessed/performed      Past Medical History  Diagnosis Date  . Diabetes mellitus age 36    no insulin  . Stroke   . Arthritis   . Carotid artery occlusion   . CVA (cerebral vascular accident)     left sided weakness  . Hyperlipidemia   . Hypertension   . Meniere disease   . Degenerative joint disease   . BPH (benign prostatic hyperplasia)   . Skin cancer     history of skin cancer  . Coccidioidomycosis     Past Surgical History  Procedure Laterality Date  . Cholecystectomy    . Prostate surgery      x2  . Back surgery  2004    There were no vitals filed for this visit.  Visit Diagnosis:  Abnormality of gait  Unsteadiness  Balance problems  Decreased coordination      Subjective Assessment - 06/03/15 1457    Subjective He reports that yesterday he had increased difficulty with walking even with walker. It may have been blood sugar.    Currently in Pain? No/denies     Self-Care: see pt education on CVA symptoms Therapeutic Exercise: Reviewed hamstring & heelcord stretches, & [place] HEP Neuromuscular Re-education: Gaze stabilization initially seated, progressed to standing with RW with BUE support, then single UE support. Gait  Training: Patient ambulated 100' X 2 with rolling walker with cues on posture, position in RW & pelvic orientation to path.                     [facility] Adult PT Treatment/Exercise - 06/03/15 1459    Ambulation/Gait   Ambulation/Gait Yes   Ambulation/Gait Assistance 5: Supervision   Ambulation Distance (Feet) 500 Feet   Assistive device Rolling walker   Gait Pattern Narrow base of support;Trunk flexed;Step-through pattern;Decreased stride length;Scissoring;Trendelenburg   Ramp 5: Supervision  RW   Curb 5: Supervision;4: Min assist                PT Education - 06/03/15 1445    Education provided Yes   Education Details signs of CVA & need to call "911" or at least MD, reviewed hamstring & heelcord stretches, gaze stabilization seated & standing   Person(s) Educated Patient   Methods Explanation;Demonstration;Tactile cues;Verbal cues;Handout   Comprehension Verbalized understanding;Returned demonstration;Verbal cues required;Tactile cues required;Need further instruction          PT Short Term Goals - 05/12/15 1445    PT SHORT TERM GOAL #1   Title Patient demonstrates understanding of initial HEP. (Target Date: 05/15/2015)   Baseline MET 05/12/2015   Time 4   Period Weeks   Status Achieved   PT SHORT TERM  GOAL #2   Title Berg Balance >34/56 (Target Date: 05/15/2015)   Baseline MET Berg Balance 39/56   Time 4   Period Weeks   Status Achieved   PT SHORT TERM GOAL #3   Title Patient ambulates 300' with single point cane with supervision. (Target Date: 05/15/2015)   Baseline Partially MET 05/12/2015  patient ambulates with SPC with supervison but only tolerated 250' before mild SOB & fatigue.   Time 4   Period Weeks   Status Partially Met   PT SHORT TERM GOAL #4   Title Patient negotiates ramps & curbs with single point cane with contact assist. (Target Date: 05/15/2015)   Baseline MET 05/12/2015   Time 4   Period Weeks   Status Achieved           PT Long  Term Goals - 06/03/15 1445    PT LONG TERM GOAL #1   Title Patient & wife verbalize & demonstrate updated HEP / fitness plan. (Target Date: 06/12/2015)   Time 8   Period Weeks   Status On-going   PT LONG TERM GOAL #2   Title Berg Balance >/= 45/56  (Target Date: 06/12/2015)   Time 8   Period Weeks   Status Revised   PT LONG TERM GOAL #3   Title Patient ambulates 300' with rolling walker modified independent.  (Target Date: 06/12/2015)   Time 8   Period Weeks   Status Revised   PT LONG TERM GOAL #4   Title Patient negotiates ramp & curb with rolling walker modified independent.  (Target Date: 06/12/2015)   Time 8   Period Weeks   Status Revised   PT LONG TERM GOAL #5   Title Patient ambulates 48' around furniture with cane modified independent.  (Target Date: 06/12/2015)   Time 8   Period Weeks   Status Revised               Plan - 06/03/15 1445    Clinical Impression Statement Based on symptoms patient may have had a TIA yesterday. He verbalizes understanding of CVA symptoms & need to seek medical assistance perferably "911".    Pt will benefit from skilled therapeutic intervention in order to improve on the following deficits Abnormal gait;Decreased activity tolerance;Decreased balance;Decreased endurance;Decreased mobility;Decreased safety awareness;Decreased strength   Rehab Potential Good   Clinical Impairments Affecting Rehab Potential Bilateral Meniere's disease   PT Frequency 2x / week   PT Duration 8 weeks   PT Treatment/Interventions ADLs/Self Care Home Management;DME Instruction;Gait training;Stair training;Functional mobility training;Therapeutic activities;Therapeutic exercise;Balance training;Neuromuscular re-education;Patient/family education   PT Next Visit Plan continue towards LTGs, Assess LTGs next week with planned discharge   Consulted and Agree with Plan of Care Patient        Problem List Patient Active Problem List   Diagnosis Date Noted  . CAP  (community acquired pneumonia) 01/27/2015  . Sepsis 01/27/2015  . Community acquired bacterial pneumonia 01/27/2015  . Stroke   . Arthritis   . Carotid artery occlusion   . CVA (cerebral vascular accident)   . Hyperlipidemia   . Hypertension   . BPH (benign prostatic hyperplasia)   . Skin cancer   . Weakness   . Pain in limb-Right trunk 07/18/2013  . Occlusion and stenosis of carotid artery without mention of cerebral infarction 07/18/2013  . TIA (transient ischemic attack) 09/15/2011  . Diabetes mellitus without complication 95/28/4132  . PERSONAL HISTORY OF COLONIC POLYPS 10/28/2009    Ronen Bromwell PT, DPT 06/03/2015, 11:10  PM  [place] [address] [place], [place], [address] Phone: [phone]   Fax:  [phone]

## 2015-06-10 ENCOUNTER — Ambulatory Visit: Payer: Medicare Other | Admitting: Physical Therapy

## 2015-06-10 ENCOUNTER — Encounter: Payer: Self-pay | Admitting: Physical Therapy

## 2015-06-10 DIAGNOSIS — R269 Unspecified abnormalities of gait and mobility: Secondary | ICD-10-CM

## 2015-06-10 DIAGNOSIS — R2681 Unsteadiness on feet: Secondary | ICD-10-CM

## 2015-06-10 DIAGNOSIS — R279 Unspecified lack of coordination: Secondary | ICD-10-CM

## 2015-06-10 DIAGNOSIS — R2689 Other abnormalities of gait and mobility: Secondary | ICD-10-CM

## 2015-06-10 DIAGNOSIS — R278 Other lack of coordination: Secondary | ICD-10-CM

## 2015-06-11 NOTE — Therapy (Signed)
[facility] [address] [place], [place], [address] Phone: [phone]   Fax:  [phone]  Physical Therapy Treatment  Patient Details  Name: Gregory Padilla MRN: [id number] Date of Birth: 04/16/1925 Referring Provider:  Morton Peters.*  Encounter Date: 06/10/2015   PHYSICAL THERAPY DISCHARGE SUMMARY  Visits from Start of Care: 15  Current functional level related to goals / functional outcomes: See below   Remaining deficits: See below   Education / Equipment: HEP, falls prevention strategies Plan: Patient agrees to discharge.  Patient goals were partially met. Patient is being discharged due to meeting the stated rehab goals.  ?????          PT End of Session - 06/10/15 1445    Visit Number 15   Number of Visits 17   Date for PT Re-Evaluation 06/12/15   Authorization Type Medicare - G Codes every 10 visits   PT Start Time 9518   PT Stop Time 1530   PT Time Calculation (min) 45 min   Equipment Utilized During Treatment Gait belt   Activity Tolerance Patient tolerated treatment well   Behavior During Therapy WFL for tasks assessed/performed      Past Medical History  Diagnosis Date  . Diabetes mellitus age 38    no insulin  . Stroke   . Arthritis   . Carotid artery occlusion   . CVA (cerebral vascular accident)     left sided weakness  . Hyperlipidemia   . Hypertension   . Meniere disease   . Degenerative joint disease   . BPH (benign prostatic hyperplasia)   . Skin cancer     history of skin cancer  . Coccidioidomycosis     Past Surgical History  Procedure Laterality Date  . Cholecystectomy    . Prostate surgery      x2  . Back surgery  2004    There were no vitals filed for this visit.  Visit Diagnosis:  Abnormality of gait  Unsteadiness  Balance problems  Decreased coordination      Subjective Assessment - 06/10/15 1447    Subjective He has been using  his walker. No falls. Some light-headedness.    Currently in Pain? No/denies            [facility] PT Assessment - 06/10/15 1445    Berg Balance Test   Sit to Stand Able to stand without using hands and stabilize independently   Standing Unsupported Able to stand safely 2 minutes   Sitting with Back Unsupported but Feet Supported on Floor or Stool Able to sit safely and securely 2 minutes   Stand to Sit Sits safely with minimal use of hands   Transfers Able to transfer safely, minor use of hands   Standing Unsupported with Eyes Closed Able to stand 10 seconds safely   Standing Ubsupported with Feet Together Able to place feet together independently and stand 1 minute safely   From Standing, Reach Forward with Outstretched Arm Can reach forward >12 cm safely (5")   From Standing Position, Pick up Object from Floor Able to pick up shoe, needs supervision   From Standing Position, Turn to Look Behind Over each Shoulder Looks behind one side only/other side shows less weight shift   Turn 360 Degrees Able to turn 360 degrees safely but slowly   Standing Unsupported, Alternately Place Feet on Step/Stool Able to complete >2 steps/needs minimal assist   Standing Unsupported, One Foot in [employer]  to take small step independently and hold 30 seconds   Standing on One Leg Tries to lift leg/unable to hold 3 seconds but remains standing independently   Total Score 43                     [facility] Adult PT Treatment/Exercise - 06/10/15 1445    Transfers   Transfers Sit to Stand;Stand to Sit   Sit to Stand 6: Modified independent (Device/Increase time)   Stand to Sit 6: Modified independent (Device/Increase time)   Ambulation/Gait   Ambulation/Gait Yes   Ambulation/Gait Assistance 6: Modified independent (Device/Increase time)   Ambulation/Gait Assistance Details PT recommended only to use cane in home & only when feeling well, use RW for all community mobility   Ambulation Distance (Feet)  500 Feet  500' with RW, 76' with cane   Assistive device Rolling walker;Straight cane   Gait Pattern Narrow base of support;Trunk flexed;Step-through pattern;Decreased stride length;Scissoring;Trendelenburg   Ambulation Surface Indoor;Level   Ramp 6: Modified independent (Device)  RW   Curb 6: Modified independent (Device/increase time)  RW                PT Education - 06/10/15 1445    Education provided Yes   Education Details PT recommended continuing HEP, use of RW for all community mobility, only use cane in home & only when not feeling light-headed or funny, Pt reports only eating 2 meals per day - PT recommended at least a snack halfway between meals to decrease effects of low blood glucose on balance,   Person(s) Educated Patient   Methods Explanation;Verbal cues   Comprehension Verbalized understanding          PT Short Term Goals - 05/12/15 1445    PT SHORT TERM GOAL #1   Title Patient demonstrates understanding of initial HEP. (Target Date: 05/15/2015)   Baseline MET 05/12/2015   Time 4   Period Weeks   Status Achieved   PT SHORT TERM GOAL #2   Title Berg Balance >34/56 (Target Date: 05/15/2015)   Baseline MET Berg Balance 39/56   Time 4   Period Weeks   Status Achieved   PT SHORT TERM GOAL #3   Title Patient ambulates 300' with single point cane with supervision. (Target Date: 05/15/2015)   Baseline Partially MET 05/12/2015  patient ambulates with SPC with supervison but only tolerated 250' before mild SOB & fatigue.   Time 4   Period Weeks   Status Partially Met   PT SHORT TERM GOAL #4   Title Patient negotiates ramps & curbs with single point cane with contact assist. (Target Date: 05/15/2015)   Baseline MET 05/12/2015   Time 4   Period Weeks   Status Achieved           PT Long Term Goals - 06/10/15 1445    PT LONG TERM GOAL #1   Title Patient verbalizes & demonstrates updated HEP / fitness plan. (Target Date: 06/12/2015)   Baseline MET 06/10/2015   Time  8   Period Weeks   Status On-going   PT LONG TERM GOAL #2   Title Berg Balance >/= 45/56  (Target Date: 06/12/2015)   Baseline Partially MET Berg Balance improved to 43/56   Time 8   Period Weeks   Status Partially Met   PT LONG TERM GOAL #3   Title Patient ambulates 300' with rolling walker modified independent.  (Target Date: 06/12/2015)   Baseline MET 06/10/2015  Time 8   Period Weeks   Status Achieved   PT LONG TERM GOAL #4   Title Patient negotiates ramp & curb with rolling walker modified independent.  (Target Date: 06/12/2015)   Baseline MET 06/10/2015   Time 8   Period Weeks   Status Achieved   PT LONG TERM GOAL #5   Title Patient ambulates 68' around furniture with cane modified independent.  (Target Date: 06/12/2015)   Time 8   Period Weeks   Status Revised               Plan - 06/10/15 1445    Clinical Impression Statement Patient met or partially met all revised LTGs. He appears for safety to need a rolling walker for gait especially outside the home / in community. He can safely use single point cane for household like gait but PT advised only if not light-headed, fatigued or at night.    Pt will benefit from skilled therapeutic intervention in order to improve on the following deficits Abnormal gait;Decreased activity tolerance;Decreased balance;Decreased endurance;Decreased mobility;Decreased safety awareness;Decreased strength   Rehab Potential Good   Clinical Impairments Affecting Rehab Potential Bilateral Meniere's disease   PT Frequency 2x / week   PT Duration 8 weeks   PT Treatment/Interventions ADLs/Self Care Home Management;DME Instruction;Gait training;Stair training;Functional mobility training;Therapeutic activities;Therapeutic exercise;Balance training;Neuromuscular re-education;Patient/family education   PT Next Visit Plan Discharge PT   Consulted and Agree with Plan of Care Patient          G-Codes - Jun 12, 2015 0916    Functional Assessment Tool  Used --   Functional Limitation --   Mobility: Walking and Moving Around Current Status (A0045) --   Mobility: Walking and Moving Around Goal Status (T9774) --      Problem List Patient Active Problem List   Diagnosis Date Noted  . CAP (community acquired pneumonia) 01/27/2015  . Sepsis 01/27/2015  . Community acquired bacterial pneumonia 01/27/2015  . Stroke   . Arthritis   . Carotid artery occlusion   . CVA (cerebral vascular accident)   . Hyperlipidemia   . Hypertension   . BPH (benign prostatic hyperplasia)   . Skin cancer   . Weakness   . Pain in limb-Right trunk 07/18/2013  . Occlusion and stenosis of carotid artery without mention of cerebral infarction 07/18/2013  . TIA (transient ischemic attack) 09/15/2011  . Diabetes mellitus without complication 14/23/9532  . PERSONAL HISTORY OF COLONIC POLYPS 10/28/2009    Janiel Derhammer PT, DPT 06-12-2015, 9:18 AM  [facility] [address] [place], [place], [address] Phone: [phone]   Fax:  [phone]

## 2015-06-12 ENCOUNTER — Ambulatory Visit: Payer: Medicare Other

## 2015-06-16 ENCOUNTER — Ambulatory Visit: Payer: Medicare Other

## 2015-06-18 ENCOUNTER — Ambulatory Visit: Payer: Medicare Other

## 2015-07-28 ENCOUNTER — Encounter: Payer: Self-pay | Admitting: Family

## 2015-07-30 ENCOUNTER — Ambulatory Visit (HOSPITAL_COMMUNITY)
Admission: RE | Admit: 2015-07-30 | Discharge: 2015-07-30 | Disposition: A | Discharge status: Home / Self Care | Payer: Medicare Other | Source: Ambulatory Visit | Attending: Family | Admitting: Family

## 2015-07-30 ENCOUNTER — Ambulatory Visit (INDEPENDENT_AMBULATORY_CARE_PROVIDER_SITE_OTHER): Payer: Medicare Other | Admitting: Family

## 2015-07-30 ENCOUNTER — Encounter: Payer: Self-pay | Admitting: Family

## 2015-07-30 VITALS — BP 135/70 | HR 59 | Temp 97.7°F | Resp 18 | Ht 72.0 in | Wt 167.0 lb

## 2015-07-30 DIAGNOSIS — I10 Essential (primary) hypertension: Secondary | ICD-10-CM | POA: Insufficient documentation

## 2015-07-30 DIAGNOSIS — E119 Type 2 diabetes mellitus without complications: Secondary | ICD-10-CM | POA: Insufficient documentation

## 2015-07-30 DIAGNOSIS — E785 Hyperlipidemia, unspecified: Secondary | ICD-10-CM | POA: Diagnosis not present

## 2015-07-30 DIAGNOSIS — I6523 Occlusion and stenosis of bilateral carotid arteries: Secondary | ICD-10-CM

## 2015-07-30 NOTE — Patient Instructions (Signed)
Stroke Prevention Some medical conditions and behaviors are associated with an increased chance of having a stroke. You may prevent a stroke by making healthy choices and managing medical conditions. HOW CAN I REDUCE MY RISK OF HAVING A STROKE?   Stay physically active. Get at least 30 minutes of activity on most or all days.  Do not smoke. It may also be helpful to avoid exposure to secondhand smoke.  Limit alcohol use. Moderate alcohol use is considered to be:  No more than 2 drinks per day for men.  No more than 1 drink per day for nonpregnant women.  Eat healthy foods. This involves:  Eating 5 or more servings of fruits and vegetables a day.  Making dietary changes that address high blood pressure (hypertension), high cholesterol, diabetes, or obesity.  Manage your cholesterol levels.  Making food choices that are high in fiber and low in saturated fat, trans fat, and cholesterol may control cholesterol levels.  Take any prescribed medicines to control cholesterol as directed by your health care provider.  Manage your diabetes.  Controlling your carbohydrate and sugar intake is recommended to manage diabetes.  Take any prescribed medicines to control diabetes as directed by your health care provider.  Control your hypertension.  Making food choices that are low in salt (sodium), saturated fat, trans fat, and cholesterol is recommended to manage hypertension.  Ask your health care provider if you need treatment to lower your blood pressure. Take any prescribed medicines to control hypertension as directed by your health care provider.  If you are 18-39 years of age, have your blood pressure checked every 3-5 years. If you are 40 years of age or older, have your blood pressure checked every year.  Maintain a healthy weight.  Reducing calorie intake and making food choices that are low in sodium, saturated fat, trans fat, and cholesterol are recommended to manage  weight.  Stop drug abuse.  Avoid taking birth control pills.  Talk to your health care provider about the risks of taking birth control pills if you are over 35 years old, smoke, get migraines, or have ever had a blood clot.  Get evaluated for sleep disorders (sleep apnea).  Talk to your health care provider about getting a sleep evaluation if you snore a lot or have excessive sleepiness.  Take medicines only as directed by your health care provider.  For some people, aspirin or blood thinners (anticoagulants) are helpful in reducing the risk of forming abnormal blood clots that can lead to stroke. If you have the irregular heart rhythm of atrial fibrillation, you should be on a blood thinner unless there is a good reason you cannot take them.  Understand all your medicine instructions.  Make sure that other conditions (such as anemia or atherosclerosis) are addressed. SEEK IMMEDIATE MEDICAL CARE IF:   You have sudden weakness or numbness of the face, arm, or leg, especially on one side of the body.  Your face or eyelid droops to one side.  You have sudden confusion.  You have trouble speaking (aphasia) or understanding.  You have sudden trouble seeing in one or both eyes.  You have sudden trouble walking.  You have dizziness.  You have a loss of balance or coordination.  You have a sudden, severe headache with no known cause.  You have new chest pain or an irregular heartbeat. Any of these symptoms may represent a serious problem that is an emergency. Do not wait to see if the symptoms will   go away. Get medical help at once. Call your local emergency services (911 in [place] Do not drive yourself to the hospital.   This information is not intended to replace advice given to you by your health care provider. Make sure you discuss any questions you have with your health care provider.   Document Released: 11/03/2004 Document Revised: 10/17/2014 Document Reviewed:  03/29/2013 Elsevier Interactive Patient Education 2016 Elsevier Inc.  

## 2015-07-30 NOTE — Progress Notes (Signed)
Established Carotid Patient   History of Present Illness  Gregory Padilla is a 81 y.o. male patient whom Dr. Kellie Simmering has been monitoring for carotid occlusive disease, returns today for followup. He suffered a small left brain stroke in 2012 following a TURP by Dr. Gaynelle Arabian, he also had a remote history of a right brain stroke about 2009 with some mild left-sided weakness. He had evidence of mild to moderate left internal carotid stenosis. He reports that he has Meniere's Disease, and has hearing loss from this, wears bilateral hearing aids, but states his balance is mildly off due to many small strokes. The patient denies amaurosis fugax or monocular blindness.   The patient denies receptive or expressive aphasia.   Patient reports New Medical or Surgical History: none.  Pt Diabetic: Yes, states in control Pt smoker: non-smoker  Pt meds include: Statin : Yes ASA: no Other anticoagulants/antiplatelets: Plavix    Past Medical History  Diagnosis Date  . Diabetes mellitus age 33    no insulin  . Stroke ([place])   . Arthritis   . Carotid artery occlusion   . CVA (cerebral vascular accident) ([place])     left sided weakness  . Hyperlipidemia   . Hypertension   . Meniere disease   . Degenerative joint disease   . BPH (benign prostatic hyperplasia)   . Skin cancer     history of skin cancer  . Coccidioidomycosis     Social History Social History  Substance Use Topics  . Smoking status: Never Smoker   . Smokeless tobacco: Never Used  . Alcohol Use: No    Family History Family History  Problem Relation Age of Onset  . Diabetes Mother   . Arthritis Mother   . Coronary artery disease Father   . Heart disease Father   . Stroke Neg Hx     Surgical History Past Surgical History  Procedure Laterality Date  . Cholecystectomy    . Prostate surgery      x2  . Back surgery  2004    Allergies  Allergen Reactions  . Amitriptyline     unknown    Current  Outpatient Prescriptions  Medication Sig Dispense Refill  . acetaminophen (TYLENOL) 500 MG tablet Take 1,000 mg by mouth at bedtime. For fever     . ALPRAZolam (XANAX) 0.25 MG tablet Take 0.25 mg by mouth at bedtime as needed. For anxiety    . Ascorbic Acid (VITAMIN C PO) Take 1,000 mg by mouth daily.     Marland Kitchen aspirin 81 MG tablet Take 81 mg by mouth daily.      . calcium citrate-vitamin D 200-200 MG-UNIT TABS Take 1 tablet by mouth 2 (two) times daily.      . clopidogrel (PLAVIX) 75 MG tablet Take 75 mg by mouth daily.      Marland Kitchen dicyclomine (BENTYL) 10 MG capsule Take 10 mg by mouth 3 (three) times daily as needed for spasms.    . diphenhydrAMINE (SOMINEX) 25 MG tablet Take 25 mg by mouth at bedtime as needed. For allergy    . esomeprazole (NEXIUM) 40 MG capsule Take 40 mg by mouth daily before breakfast.      . hyoscyamine (LEVBID) 0.375 MG 12 hr tablet Take 1 tablet (0.375 mg total) by mouth 2 (two) times daily. (Patient not taking: Reported on 04/15/2015) 60 tablet 4  . ketoconazole (NIZORAL) 200 MG tablet Take by mouth daily.    Marland Kitchen levofloxacin (LEVAQUIN) 750 MG tablet Take 1 tablet (  750 mg total) by mouth daily. (Patient not taking: Reported on 04/15/2015) 4 tablet 0  . Linaclotide (LINZESS) 145 MCG CAPS capsule Take 1 capsule (145 mcg total) by mouth daily. (Patient not taking: Reported on 04/15/2015) 120 capsule 0  . metFORMIN (GLUCOPHAGE) 500 MG tablet Take 1 tablet (500 mg total) by mouth 2 (two) times daily with a meal. (Patient not taking: Reported on 04/15/2015) 60 tablet 0  . mirabegron ER (MYRBETRIQ) 50 MG TB24 tablet Take 50 mg by mouth at bedtime.    . Multiple Vitamin (MULTIVITAMIN WITH MINERALS) TABS tablet Take 1 tablet by mouth daily.    Vladimir Faster Glycol-Propyl Glycol (SYSTANE OP) Place 1-2 drops into both eyes 2 (two) times daily.     Vladimir Faster Glycol-Propyl Glycol 0.4-0.3 % GEL Apply 1 application to eye at bedtime.    . polyethylene glycol powder (MIRALAX) powder Take 17 g by mouth  daily as needed for mild constipation.     . psyllium (METAMUCIL) 58.6 % powder Take 1 packet by mouth 3 (three) times daily.    . rosuvastatin (CRESTOR) 5 MG tablet Take 5 mg by mouth daily.      . tamsulosin (FLOMAX) 0.4 MG CAPS capsule Take 0.8 mg by mouth daily.     No current facility-administered medications for this visit.    Review of Systems : See HPI for pertinent positives and negatives.  Physical Examination  Filed Vitals:   07/30/15 1333  BP: 129/77  Pulse: 59  Temp: 97.7 F (36.5 C)  Resp: 18  Height: 6' (1.829 m)  Weight: 167 lb (75.751 kg)  SpO2: 99%   Body mass index is 22.64 kg/(m^2).  General: WDWN male in NAD GAIT: slow and deliberate, using walker Eyes: PERRLA Pulmonary: CTAB, Negative Rales, Negative rhonchi, & Negative wheezing.  Cardiac: regular Rhythm, positive murmur.  VASCULAR EXAM Carotid Bruits Left Right   Transmitted cardiac murmer Transmitted cardiac murmer   Aorta is not palpable. Radial pulses are 2+ palpable and equal.      LE Pulses LEFT RIGHT   POPLITEAL not palpable  not palpable   POSTERIOR TIBIAL  palpable   palpable    DORSALIS PEDIS  ANTERIOR TIBIAL palpable  palpable     Gastrointestinal:no tenderness, no palpable masses.  Musculoskeletal: Negative muscle atrophy/wasting. M/S 5/5 throughout, Extremities without ischemic changes.  Neurologic: A&O X 3; Appropriate Affect ; SENSATION ;normal;  Speech is normal CN 2-12 intact except hard of hearing, Pain and light touch intact in extremities, Motor exam as listed above.         09/15/11 MRA of head: 1. Mild atherosclerotic irregularity of the supraclinoid internal  carotid arteries bilaterally with approximately 50% stenosis on the  left.  2. Mild moderate small vessel disease. No  significant proximal  stenosis, aneurysm, or branch vessel occlusion is evident.  MRI of head 09/15/11: 1. No acute intracranial abnormality or significant interval  change.  2. The stable remote infarcts of the left parietal lobe and  cerebellum.  3. Stable periventricular subcortical white matter changes  bilaterally, likely reflecting sequelae of chronic microvascular  ischemia.   Non-Invasive Vascular Imaging CAROTID DUPLEX 07/30/2015   Right ICA: 1 - 39 % stenosis. Left ICA: 40 - 59 % stenosis. Mild increase in the left ICA stenosis compared to study on 07/25/14, with the right ICA remaining stable.   Assessment: URIAN MARTENSON is a 81 y.o. male who had mild strokes in 2009 and 2012, none subsequently. Today's carotid duplex  suggests minimal right ICA stenosis and mild/moiderate left ICA stenosis. Mild increase in the left ICA stenosis compared to study on 07/25/14, with the right ICA remaining stable.  His DM seems in control and he has never used tobacco. He is on a statin and Plavix.   Plan: Follow-up in 1 year with Carotid Duplex scan.   I discussed in depth with the patient the nature of atherosclerosis, and emphasized the importance of maximal medical management including strict control of blood pressure, blood glucose, and lipid levels, obtaining regular exercise, and continued cessation of smoking.  The patient is aware that without maximal medical management the underlying atherosclerotic disease process will progress, limiting the benefit of any interventions. The patient was given information about stroke prevention and what symptoms should prompt the patient to seek immediate medical care. Thank you for allowing [place] to participate in this patient's care.  Clemon Chambers, RN, MSN, FNP-C [facility] Office: [phone]  Clinic Physician: Oneida Alar  07/30/2015 1:35 PM

## 2015-08-04 NOTE — Addendum Note (Signed)
Addended by: Dorthula Rue L on: 08/04/2015 02:44 PM   Modules accepted: Orders

## 2015-08-21 DIAGNOSIS — I639 Cerebral infarction, unspecified: Secondary | ICD-10-CM | POA: Insufficient documentation

## 2015-08-21 DIAGNOSIS — N400 Enlarged prostate without lower urinary tract symptoms: Secondary | ICD-10-CM | POA: Insufficient documentation

## 2016-08-02 ENCOUNTER — Encounter: Payer: Self-pay | Admitting: Family

## 2016-08-04 ENCOUNTER — Ambulatory Visit (HOSPITAL_COMMUNITY)
Admission: RE | Admit: 2016-08-04 | Discharge: 2016-08-04 | Disposition: A | Discharge status: Home / Self Care | Payer: Medicare Other | Source: Ambulatory Visit | Attending: Family | Admitting: Family

## 2016-08-04 ENCOUNTER — Ambulatory Visit (INDEPENDENT_AMBULATORY_CARE_PROVIDER_SITE_OTHER): Payer: Medicare Other | Admitting: Family

## 2016-08-04 ENCOUNTER — Encounter: Payer: Self-pay | Admitting: Family

## 2016-08-04 VITALS — BP 158/78 | HR 58 | Temp 97.7°F | Resp 18 | Ht 70.0 in | Wt 173.0 lb

## 2016-08-04 DIAGNOSIS — Z8673 Personal history of transient ischemic attack (TIA), and cerebral infarction without residual deficits: Secondary | ICD-10-CM | POA: Diagnosis not present

## 2016-08-04 DIAGNOSIS — I6523 Occlusion and stenosis of bilateral carotid arteries: Secondary | ICD-10-CM

## 2016-08-04 LAB — VAS US CAROTID
LCCADDIAS: 16 cm/s
LCCADSYS: 92 cm/s
LEFT ECA DIAS: -4 cm/s
LEFT VERTEBRAL DIAS: 17 cm/s
LICADDIAS: -14 cm/s
LICADSYS: -83 cm/s
LICAPDIAS: -50 cm/s
LICAPSYS: -194 cm/s
Left CCA prox dias: 11 cm/s
Left CCA prox sys: 72 cm/s
RIGHT CCA MID DIAS: -10 cm/s
RIGHT ECA DIAS: 3 cm/s
RIGHT VERTEBRAL DIAS: 12 cm/s
Right CCA prox dias: 11 cm/s
Right CCA prox sys: 60 cm/s
Right cca dist sys: 73 cm/s

## 2016-08-04 NOTE — Patient Instructions (Signed)
Stroke Prevention Some medical conditions and behaviors are associated with an increased chance of having a stroke. You may prevent a stroke by making healthy choices and managing medical conditions. HOW CAN I REDUCE MY RISK OF HAVING A STROKE?   Stay physically active. Get at least 30 minutes of activity on most or all days.  Do not smoke. It may also be helpful to avoid exposure to secondhand smoke.  Limit alcohol use. Moderate alcohol use is considered to be:  No more than 2 drinks per day for men.  No more than 1 drink per day for nonpregnant women.  Eat healthy foods. This involves:  Eating 5 or more servings of fruits and vegetables a day.  Making dietary changes that address high blood pressure (hypertension), high cholesterol, diabetes, or obesity.  Manage your cholesterol levels.  Making food choices that are high in fiber and low in saturated fat, trans fat, and cholesterol may control cholesterol levels.  Take any prescribed medicines to control cholesterol as directed by your health care provider.  Manage your diabetes.  Controlling your carbohydrate and sugar intake is recommended to manage diabetes.  Take any prescribed medicines to control diabetes as directed by your health care provider.  Control your hypertension.  Making food choices that are low in salt (sodium), saturated fat, trans fat, and cholesterol is recommended to manage hypertension.  Ask your health care provider if you need treatment to lower your blood pressure. Take any prescribed medicines to control hypertension as directed by your health care provider.  If you are 18-39 years of age, have your blood pressure checked every 3-5 years. If you are 40 years of age or older, have your blood pressure checked every year.  Maintain a healthy weight.  Reducing calorie intake and making food choices that are low in sodium, saturated fat, trans fat, and cholesterol are recommended to manage  weight.  Stop drug abuse.  Avoid taking birth control pills.  Talk to your health care provider about the risks of taking birth control pills if you are over 35 years old, smoke, get migraines, or have ever had a blood clot.  Get evaluated for sleep disorders (sleep apnea).  Talk to your health care provider about getting a sleep evaluation if you snore a lot or have excessive sleepiness.  Take medicines only as directed by your health care provider.  For some people, aspirin or blood thinners (anticoagulants) are helpful in reducing the risk of forming abnormal blood clots that can lead to stroke. If you have the irregular heart rhythm of atrial fibrillation, you should be on a blood thinner unless there is a good reason you cannot take them.  Understand all your medicine instructions.  Make sure that other conditions (such as anemia or atherosclerosis) are addressed. SEEK IMMEDIATE MEDICAL CARE IF:   You have sudden weakness or numbness of the face, arm, or leg, especially on one side of the body.  Your face or eyelid droops to one side.  You have sudden confusion.  You have trouble speaking (aphasia) or understanding.  You have sudden trouble seeing in one or both eyes.  You have sudden trouble walking.  You have dizziness.  You have a loss of balance or coordination.  You have a sudden, severe headache with no known cause.  You have new chest pain or an irregular heartbeat. Any of these symptoms may represent a serious problem that is an emergency. Do not wait to see if the symptoms will   go away. Get medical help at once. Call your local emergency services (911 in [place] Do not drive yourself to the hospital.   This information is not intended to replace advice given to you by your health care provider. Make sure you discuss any questions you have with your health care provider.   Document Released: 11/03/2004 Document Revised: 10/17/2014 Document Reviewed:  03/29/2013 Elsevier Interactive Patient Education 2016 Elsevier Inc.  

## 2016-08-04 NOTE — Progress Notes (Signed)
Chief Complaint: Follow up Extracranial Carotid Artery Stenosis   History of Present Illness  Gregory Padilla is a 82 y.o. male patient whom Dr. Kellie Simmering has been monitoring for extracranial carotid artery occlusive disease. He returns today for followup. He suffered a small left brain stroke in 2012 following a TURP by Dr. Gaynelle Arabian, he also had a remote history of a right brain stroke about 2009 with some mild left-sided weakness. He had evidence of mild to moderate left internal carotid stenosis.  He reports a TIA this Summer of 2017 while in his yard; he did not get evaluated for this. His symptoms were feeling woozy. He states it was hot that day.  He denies vision changes, denies hemiparesis, denies speech difficulties.  He reports that he has Meniere's Disease, and has hearing loss from this, wears bilateral hearing aids, but states his balance is mildly off due to many small strokes. The patient denies a history of amaurosis fugax or monocular blindness, denies a history of receptive or expressive aphasia.   His primary concern seems to be his dyspnea and generalized weakness.   Pt Diabetic: Yes, states in control Pt smoker: non-smoker  Pt meds include: Statin : Yes ASA: no Other anticoagulants/antiplatelets: Plavix   Past Medical History:  Diagnosis Date  . Arthritis   . BPH (benign prostatic hyperplasia)   . Carotid artery occlusion   . Coccidioidomycosis   . CVA (cerebral vascular accident) ([place])    left sided weakness  . Degenerative joint disease   . Diabetes mellitus age 14   no insulin  . Hyperlipidemia   . Hypertension   . Meniere disease   . Skin cancer    history of skin cancer  . Stroke [facility])     Social History Social History  Substance Use Topics  . Smoking status: Never Smoker  . Smokeless tobacco: Never Used  . Alcohol use No    Family History Family History  Problem Relation Age of Onset  . Diabetes Mother   . Arthritis Mother    . Coronary artery disease Father   . Heart disease Father   . Stroke Neg Hx     Surgical History Past Surgical History:  Procedure Laterality Date  . BACK SURGERY  2004  . CHOLECYSTECTOMY    . PROSTATE SURGERY     x2    Allergies  Allergen Reactions  . Amitriptyline     unknown    Current Outpatient Prescriptions  Medication Sig Dispense Refill  . acetaminophen (TYLENOL) 500 MG tablet Take 1,000 mg by mouth at bedtime. For fever     . ALPRAZolam (XANAX) 0.25 MG tablet Take 0.25 mg by mouth at bedtime as needed. For anxiety    . Ascorbic Acid (VITAMIN C PO) Take 1,000 mg by mouth daily.     Marland Kitchen aspirin 81 MG tablet Take 81 mg by mouth daily.      . calcium citrate-vitamin D 200-200 MG-UNIT TABS Take 1 tablet by mouth 2 (two) times daily.      . clopidogrel (PLAVIX) 75 MG tablet Take 75 mg by mouth daily.      Marland Kitchen dicyclomine (BENTYL) 10 MG capsule Take 10 mg by mouth 3 (three) times daily as needed for spasms.    . diphenhydrAMINE (SOMINEX) 25 MG tablet Take 25 mg by mouth at bedtime as needed. For allergy    . donepezil (ARICEPT) 10 MG tablet Take by mouth.    . esomeprazole (NEXIUM) 40 MG capsule Take  40 mg by mouth daily before breakfast.      . ipratropium (ATROVENT) 0.03 % nasal spray Place into the nose.    Marland Kitchen ketoconazole (NIZORAL) 200 MG tablet Take by mouth daily.    . Linaclotide (LINZESS) 145 MCG CAPS capsule Take 1 capsule (145 mcg total) by mouth daily. 120 capsule 0  . metFORMIN (GLUCOPHAGE) 500 MG tablet Take 1 tablet (500 mg total) by mouth 2 (two) times daily with a meal. 60 tablet 0  . mirabegron ER (MYRBETRIQ) 50 MG TB24 tablet Take 50 mg by mouth at bedtime.    . Multiple Vitamin (MULTIVITAMIN WITH MINERALS) TABS tablet Take 1 tablet by mouth daily.    Vladimir Faster Glycol-Propyl Glycol (SYSTANE OP) Place 1-2 drops into both eyes 2 (two) times daily.     Vladimir Faster Glycol-Propyl Glycol 0.4-0.3 % GEL Apply 1 application to eye at bedtime.    . polyethylene  glycol powder (MIRALAX) powder Take 17 g by mouth daily as needed for mild constipation.     . psyllium (METAMUCIL) 58.6 % powder Take 1 packet by mouth 3 (three) times daily.    . rosuvastatin (CRESTOR) 5 MG tablet Take 5 mg by mouth daily.      . tamsulosin (FLOMAX) 0.4 MG CAPS capsule Take 0.8 mg by mouth daily.     No current facility-administered medications for this visit.     Review of Systems : See HPI for pertinent positives and negatives.  Physical Examination  Vitals:   08/04/16 1609 08/04/16 1613 08/04/16 1614  BP: (!) 164/87 (!) 142/74 (!) 158/78  Pulse: (!) 55 (!) 56 (!) 58  Resp: 18    Temp: 97.7 F (36.5 C)    TempSrc: Oral    SpO2: 99%    Weight: 173 lb (78.5 kg)    Height: 5\' 10"  (1.778 m)     Body mass index is 24.82 kg/m.  General: WDWN male in NAD GAIT: slow and deliberate, using walker Eyes: PERRLA Pulmonary: Respirations are non labored, CTAB, good air movement in all fields.   Cardiac: regular rhythm, positive murmur.  VASCULAR EXAM Carotid Bruits Left Right   Transmitted cardiac murmer Transmitted cardiac murmer   Aorta is not palpable. Radial pulses are 2+ palpable and equal.      LE Pulses LEFT RIGHT   POPLITEAL not palpable  not palpable   POSTERIOR TIBIAL  palpable   palpable    DORSALIS PEDIS  ANTERIOR TIBIAL palpable  palpable     Gastrointestinal:no tenderness, normal pitched bowel sounds, no palpable masses.  Musculoskeletal: No muscle atrophy/wasting. M/S 5/5 throughout, Extremities without ischemic changes.  Neurologic: A&O X 3; Appropriate Affect ; SENSATION ;normal;  Speech is normal CN 2-12 intact except hard of hearing, Pain and light touch intact in extremities, Motor exam as listed above.   09/15/11 MRA of head: 1. Mild atherosclerotic  irregularity of the supraclinoid internal  carotid arteries bilaterally with approximately 50% stenosis on the  left.  2. Mild moderate small vessel disease. No significant proximal  stenosis, aneurysm, or branch vessel occlusion is evident.  MRI of head 09/15/11: 1. No acute intracranial abnormality or significant interval  change.  2. The stable remote infarcts of the left parietal lobe and  cerebellum.  3. Stable periventricular subcortical white matter changes  bilaterally, likely reflecting sequelae of chronic microvascular  ischemia.    Assessment: ANKUSH WIDRIG is a 82 y.o. male who had mild strokes in 2009 and 2012, none subsequently. His  DM seems in control and he has never used tobacco. He is on a statin and Plavix.   DATA Today's carotid duplex suggests <40% right ICA stenosis and 40-59% left ICA stenosis. No significant stenosis of the bilateral ECA or CCA. Bilateral vertebral arteries are antegrade (normal).  Bilateral subclavian arteries are multiphasic (normal).  No significant change compared to the last exam on 07/30/15.     Plan: Follow-up in 1 year with Carotid Duplex scan.   I discussed in depth with the patient the nature of atherosclerosis, and emphasized the importance of maximal medical management including strict control of blood pressure, blood glucose, and lipid levels, obtaining regular exercise, and continued cessation of smoking.  The patient is aware that without maximal medical management the underlying atherosclerotic disease process will progress, limiting the benefit of any interventions. The patient was given information about stroke prevention and what symptoms should prompt the patient to seek immediate medical care. Thank you for allowing [place] to participate in this patient's care.  Clemon Chambers, RN, MSN, FNP-C [facility] Office: [phone]  Clinic Physician: Oneida Alar  08/04/16 4:21  PM

## 2016-08-30 NOTE — Addendum Note (Signed)
Addended by: Lianne Cure A on: 08/30/2016 02:18 PM   Modules accepted: Orders

## 2017-01-27 ENCOUNTER — Other Ambulatory Visit: Payer: Self-pay | Admitting: Nurse Practitioner

## 2017-01-27 DIAGNOSIS — R29898 Other symptoms and signs involving the musculoskeletal system: Secondary | ICD-10-CM

## 2017-02-06 ENCOUNTER — Ambulatory Visit: Payer: Medicare Other | Attending: Neurology | Admitting: Physical Therapy

## 2017-02-06 ENCOUNTER — Encounter: Payer: Self-pay | Admitting: Physical Therapy

## 2017-02-06 DIAGNOSIS — M6281 Muscle weakness (generalized): Secondary | ICD-10-CM | POA: Diagnosis present

## 2017-02-06 DIAGNOSIS — R2681 Unsteadiness on feet: Secondary | ICD-10-CM | POA: Diagnosis present

## 2017-02-06 DIAGNOSIS — R269 Unspecified abnormalities of gait and mobility: Secondary | ICD-10-CM | POA: Insufficient documentation

## 2017-02-06 NOTE — Patient Instructions (Addendum)
    Copyright  VHI. All rights reserved. Knee to Chest (Flexion)   Pull knee toward chest. Feel stretch in lower back or buttock area. Breathing deeply, Hold __15__ seconds. Repeat with other knee. Repeat _2-3___ times. Do _2-3___ sessions per day.  http://[email or web address].exer.us/225   Copyright  VHI. All rights reserved.   Lower Trunk Rotation Stretch  Lying on back with knees bent, Keeping back flat and feet together, rotate knees side to side slowly and in pain free range of motion.  Hold _2___ seconds. Repeat for 1-2 minutes. Do __1__ sets per session. Do __2-3__ sessions per day.  http://orth.exer.us/122   Copyright  VHI. All rights reserved.   Bridge   Lie back, legs bent. Inhale, pressing hips up.  Exhale, rolling down along spine from top. Repeat _10___ times. Do __1-2__ sessions per day.  Copyright  VHI. All rights reserved.     Hamstring Stretch    With other leg bent, foot flat, grasp right leg pull knee towards chest, then try to straighten leg until stretch is felt behind kneel . Hold __5__ seconds. Repeat _5___ times. Do __2__ sessions per day.  http://gt2.exer.us/279   Copyright  VHI. All rights reserved.

## 2017-02-06 NOTE — Therapy (Signed)
[place] MAIN [facility] SERVICES [address] [place], [place], [address] Phone: [phone]   Fax:  [phone]  Physical Therapy Evaluation  Patient Details  Name: Gregory Padilla MRN: [id number] Date of Birth: [birth year] Referring Provider: Dr. Manuella Ghazi  Encounter Date: 02/06/2017      PT End of Session - 02/06/17 1418    Visit Number 1   Number of Visits 17   Date for PT Re-Evaluation 04/03/17   Authorization Type gcode 1   Authorization Time Period 10   PT Start Time 1300   PT Stop Time 1358   PT Time Calculation (min) 58 min   Equipment Utilized During Treatment Gait belt   Activity Tolerance Patient tolerated treatment well;No increased pain   Behavior During Therapy WFL for tasks assessed/performed      Past Medical History:  Diagnosis Date  . Arthritis   . BPH (benign prostatic hyperplasia)   . Carotid artery occlusion   . Coccidioidomycosis   . CVA (cerebral vascular accident) ([place])    left sided weakness  . Degenerative joint disease   . Diabetes mellitus age 55   no insulin  . Hyperlipidemia   . Hypertension   . Meniere disease   . Skin cancer    history of skin cancer  . Stroke [facility])     Past Surgical History:  Procedure Laterality Date  . BACK SURGERY  2004  . CHOLECYSTECTOMY    . PROSTATE SURGERY     x2    There were no vitals filed for this visit.       Subjective Assessment - 02/06/17 1306    Subjective 83 yo Male reports impaired balance and difficulty walking over last few months. He reports having a recent fall about 2 weeks ago when he was turning in the bathroom. He currently uses a RW which he uses all the time. He reports increased weakness over the last month. He reports increased stiffness in BLE with popping in knees and occasional pain. He reports having trouble getting up out of a chair, especially when low or without arm rests; He denies any numbness/tingling; He will have leg cramps  sometimes;    Pertinent History personal factors affecting rehab: high fall risk, lives with wife who has limited mobility, not driving but has transportation assist from family, HTN/Diabetes (controlled); impaired memory, ramped entrance in home, tub/shower but does have shower seat; past strokes;    Limitations Standing;Walking   How long can you sit comfortably? NA   How long can you stand comfortably? 2-5 min   How long can you walk comfortably? >500 feet with RW; he reports increased stiffness and difficulty walking in the morning;    Diagnostic tests no recent imaging   Patient Stated Goals "I want to move around better" reduce falls,    Currently in Pain? No/denies   Multiple Pain Sites No            [facility] PT Assessment - 02/06/17 0001      Assessment   Medical Diagnosis Impaired balance   Referring Provider Dr. Manuella Ghazi   Onset Date/Surgical Date --  worsened last few months; stroke 8 years ago   Hand Dominance Right   Next MD Visit none scheduled   Prior Therapy had PT following stroke 8 years ago with good results; denies any PT recently for this condition;      Precautions   Precautions Fall     Restrictions   Weight Bearing  Restrictions No     Balance Screen   Has the patient fallen in the past 6 months Yes   How many times? 6-8   Has the patient had a decrease in activity level because of a fear of falling?  Yes   Is the patient reluctant to leave their home because of a fear of falling?  No     Home Environment   Additional Comments lives in a single story home with his wife; has ramp entrance; reports his wife has had multiple surgeries and doesn't get around well; He reports eating out; uses a tub shower with seat; reports being mod I for self care ADLs including bathing/dressing; takes longer time;      Prior Function   Level of Independence Independent with basic ADLs;Independent with household mobility with device;Requires assistive device for independence   uses RW for a while   Vocation Retired   Leisure gets on computer; dozes;      Cognition   Overall Cognitive Status --  has trouble with short term memory;      Observation/Other Assessments   Observations patient hard of hearing; pleasant man;      Sensation   Light Touch Appears Intact   Additional Comments reports no numbness/tingling;      Coordination   Gross Motor Movements are Fluid and Coordinated Yes   Fine Motor Movements are Fluid and Coordinated --  finger to thumb accurate;      Posture/Postural Control   Posture Comments sits with moderate forward head; mild rounded shoulders;      AROM   Overall AROM Comments BUE and BLE are WFL;      Strength   Right Hip Flexion 4-/5   Right Hip Extension 3/5   Right Hip ABduction 3+/5   Right Hip ADduction 3+/5   Left Hip Flexion 3+/5   Left Hip Extension 3-/5   Left Hip ABduction 3/5   Left Hip ADduction 3/5   Right Knee Flexion 3/5   Right Knee Extension 4-/5   Left Knee Flexion 3/5   Left Knee Extension 4-/5   Right Ankle Dorsiflexion 4/5   Right Ankle Plantar Flexion 2/5   Left Ankle Dorsiflexion 4/5   Left Ankle Plantar Flexion 2/5     Palpation   Palpation comment denies any pain to palpation; exhibits tightness in posterior leg;      Transfers   Comments requires 2 HHA to push up from chair and reports some left knee discomfort with transfers;      Ambulation/Gait   Gait Comments ambulates with RW, narrow base of support, increased foot drag, slight sway, slower gait speed, heavy lean through BUE on walker, forward flexed posture, increased knee flexion during stance;      Standardized Balance Assessment   Five times sit to stand comments  24 sec with 2 HHA pushing on chair (>15 sec indicates high fall risk)   10 Meter Walk 0.74 m/s (13.5 sec) with RW, limited community ambulator     [employer]   Sit to Stand Able to stand using hands after several tries   Standing Unsupported Able to stand  30 seconds unsupported   Sitting with Back Unsupported but Feet Supported on Floor or Stool Able to sit safely and securely 2 minutes   Stand to Sit Controls descent by using hands   Transfers Able to transfer with verbal cueing and /or supervision   Standing Unsupported with Eyes Closed Needs help to keep  from falling   Standing Ubsupported with Feet Together Able to place feet together independently but unable to hold for 30 seconds   From Standing, Reach Forward with Outstretched Arm Can reach forward >12 cm safely (5")   From Standing Position, Pick up Object from Floor Unable to try/needs assist to keep balance   From Standing Position, Turn to Look Behind Over each Shoulder Needs assist to keep from losing balance and falling   Turn 360 Degrees Needs assistance while turning   Standing Unsupported, Alternately Place Feet on Step/Stool Able to complete >2 steps/needs minimal assist   Standing Unsupported, One Foot in Front Loses balance while stepping or standing   Standing on One Leg Unable to try or needs assist to prevent fall   Total Score 19   Berg comment: 100% risk for falls;      High Level Balance   High Level Balance Comments patient able to stand short time unsupported but requires CGA to close supervision with increased flexed posture including knee flexion in stance; unable to hold tandem stance or SLS;        TREATMENT: PT instructed patient in LE stretches to reduce stiffness: Patient hooklying: Knee to chest stretch 10 sec hold x2 bilaterally; Lumbar trunk rotation x1 min; Hamstring neural stretch 5 sec hold x5 bilaterally; Bridges with arms across chest x10 reps; Patient required min-moderate verbal/tactile cues for correct exercise technique. Provided written handout for better compliance;                     PT Education - 02/06/17 1417    Education provided Yes   Education Details HEP initiated, recommendations/plan of care   Person(s)  Educated Patient   Methods Explanation;Verbal cues;Handout   Comprehension Returned demonstration;Verbalized understanding;Verbal cues required             PT Long Term Goals - 02/06/17 1423      PT LONG TERM GOAL #1   Title Patient will be independent in home exercise program to improve strength/mobility for better functional independence with ADLs.   Time 8   Period Weeks   Status New     PT LONG TERM GOAL #2   Title Patient (> 85 years old) will complete five times sit to stand test in < 15 seconds indicating an increased LE strength and improved balance.   Time 8   Period Weeks   Status New     PT LONG TERM GOAL #3   Title Patient will increase Berg Balance score by > 6 points to demonstrate decreased fall risk during functional activities.   Time 8   Period Weeks   Status New     PT LONG TERM GOAL #4   Title Patient will increase 10 meter walk test to >1.76m/s as to improve gait speed for better community ambulation and to reduce fall risk.   Time 8   Period Weeks   Status New     PT LONG TERM GOAL #5   Title Patient will increase BLE gross strength to 4+/5 as to improve functional strength for independent gait, increased standing tolerance and increased ADL ability.   Time 8   Period Weeks   Status New               Plan - 02/06/17 1418    Clinical Impression Statement 83 yo Male presents to therapy with difficulty walking, weakness in BLE (L>R) and imbalance. He reports multiple recent falls. He is  currently using a RW for ambulation which he uses all the time. Patient's medical history significant for past CVA with left sided weakness. Patient denies any numbness. He tested as a high fall risk with low Berg Balance score. Patient ambulates slower with RW. He also exhibits stiffness in BLE. He would benefit from skilled PT intervention to improve strength, balance and gait safety to reduce fall risk and improve mobility;    Rehab Potential Good   Clinical  Impairments Affecting Rehab Potential positive: motivated, good caregiver support; negative: co-morbidities, age, fall risk; Patient's clinical presentation is evolving as he has had multiple recent falls and exhibits significant weakness and gait instability;    PT Frequency 2x / week   PT Duration 8 weeks   PT Treatment/Interventions Cryotherapy;Moist Heat;Neuromuscular re-education;Balance training;Therapeutic exercise;Therapeutic activities;Functional mobility training;Stair training;Gait training;DME Instruction;Patient/family education;Passive range of motion;Energy conservation   PT Next Visit Plan 6 min walk test, balance/strengthening   PT Home Exercise Plan initiated- see patient instructions;    Consulted and Agree with Plan of Care Patient      Patient will benefit from skilled therapeutic intervention in order to improve the following deficits and impairments:  Abnormal gait, Decreased endurance, Hypomobility, Decreased activity tolerance, Decreased strength, Difficulty walking, Decreased mobility, Decreased balance, Decreased range of motion, Improper body mechanics, Postural dysfunction, Impaired flexibility, Decreased safety awareness  Visit Diagnosis: Abnormality of gait - Plan: PT plan of care cert/re-cert  Unsteadiness - Plan: PT plan of care cert/re-cert  Muscle weakness (generalized) - Plan: PT plan of care cert/re-cert      G-Codes - 02/40/97 1425    Functional Assessment Tool Used (Outpatient Only) 10 meter walk, 5 times sit<>Stand, Berg Balance assesment, clinical judgement;    Functional Limitation Mobility: Walking and moving around   Mobility: Walking and Moving Around Current Status [phone]) At least 60 percent but less than 80 percent impaired, limited or restricted   Mobility: Walking and Moving Around Goal Status [phone]) At least 20 percent but less than 40 percent impaired, limited or restricted       Problem List Patient Active Problem List   Diagnosis  Date Noted  . CAP (community acquired pneumonia) 01/27/2015  . Sepsis ([place]) 01/27/2015  . Community acquired bacterial pneumonia 01/27/2015  . Stroke ([place])   . Arthritis   . Carotid artery occlusion   . CVA (cerebral vascular accident) ([place])   . Hyperlipidemia   . Hypertension   . BPH (benign prostatic hyperplasia)   . Skin cancer   . Weakness   . Pain in limb-Right trunk 07/18/2013  . Occlusion and stenosis of carotid artery without mention of cerebral infarction 07/18/2013  . TIA (transient ischemic attack) 09/15/2011  . Diabetes mellitus without complication ([place]) 83/41/9622  . PERSONAL HISTORY OF COLONIC POLYPS 10/28/2009    Trotter,Margaret PT, DPT 02/06/2017, 2:27 PM  [place] MAIN [facility] SERVICES [address] [place], [place], [address] Phone: [phone]   Fax:  [phone]  Name: Gregory Padilla MRN: [id number] Date of Birth: January 24, 1925

## 2017-02-07 ENCOUNTER — Ambulatory Visit
Admission: RE | Admit: 2017-02-07 | Discharge: 2017-02-07 | Disposition: A | Discharge status: Home / Self Care | Payer: Medicare Other | Source: Ambulatory Visit | Attending: Nurse Practitioner | Admitting: Nurse Practitioner

## 2017-02-13 ENCOUNTER — Ambulatory Visit: Payer: Medicare Other | Admitting: Physical Therapy

## 2017-02-14 ENCOUNTER — Encounter: Payer: Self-pay | Admitting: Physical Therapy

## 2017-02-14 ENCOUNTER — Ambulatory Visit: Payer: Medicare Other | Attending: Neurology | Admitting: Physical Therapy

## 2017-02-14 DIAGNOSIS — M6281 Muscle weakness (generalized): Secondary | ICD-10-CM | POA: Diagnosis present

## 2017-02-14 DIAGNOSIS — R2689 Other abnormalities of gait and mobility: Secondary | ICD-10-CM | POA: Insufficient documentation

## 2017-02-14 DIAGNOSIS — R269 Unspecified abnormalities of gait and mobility: Secondary | ICD-10-CM | POA: Diagnosis not present

## 2017-02-14 DIAGNOSIS — R2681 Unsteadiness on feet: Secondary | ICD-10-CM

## 2017-02-14 NOTE — Therapy (Signed)
[place] MAIN [facility] SERVICES [address] [place], [place], [address] Phone: [phone]   Fax:  [phone]  Physical Therapy Treatment  Patient Details  Name: Gregory Padilla MRN: [id number] Date of Birth: 1925-07-06 Referring Provider: Dr. Manuella Ghazi  Encounter Date: 02/14/2017      PT End of Session - 02/14/17 0906    Visit Number 2   Number of Visits 17   Date for PT Re-Evaluation 04/03/17   Authorization Type gcode 2   Authorization Time Period 10   PT Start Time 0908   PT Stop Time 0950   PT Time Calculation (min) 42 min   Equipment Utilized During Treatment Gait belt   Activity Tolerance Patient tolerated treatment well;No increased pain   Behavior During Therapy WFL for tasks assessed/performed      Past Medical History:  Diagnosis Date  . Arthritis   . BPH (benign prostatic hyperplasia)   . Carotid artery occlusion   . Coccidioidomycosis   . CVA (cerebral vascular accident) ([place])    left sided weakness  . Degenerative joint disease   . Diabetes mellitus age 30   no insulin  . Hyperlipidemia   . Hypertension   . Meniere disease   . Skin cancer    history of skin cancer  . Stroke [facility])     Past Surgical History:  Procedure Laterality Date  . BACK SURGERY  2004  . CHOLECYSTECTOMY    . PROSTATE SURGERY     x2    There were no vitals filed for this visit.      Subjective Assessment - 02/14/17 0915    Subjective Patient reports some soreness after last session; He reports compliance with HEP; Reports no pain currently. He denies any recent falls;    Pertinent History personal factors affecting rehab: high fall risk, lives with wife who has limited mobility, not driving but has transportation assist from family, HTN/Diabetes (controlled); impaired memory, ramped entrance in home, tub/shower but does have shower seat; past strokes;    Limitations Standing;Walking   How long can you sit comfortably? NA   How long  can you stand comfortably? 2-5 min   How long can you walk comfortably? >500 feet with RW; he reports increased stiffness and difficulty walking in the morning;    Diagnostic tests no recent imaging   Patient Stated Goals "I want to move around better" reduce falls,    Currently in Pain? No/denies       TREATMENT: Patient ambulated around gym with RW, requiring close supervision demonstrating increased foot drag, forward trunk lean, requiring mod VCS to increase hip/knee flexion during swing for better foot clearance and to increase step length;  Leg press: BLE plate105# 2x15 with cues to slow down LE movement and avoid terminal knee extension for better quad strengthening;   Standing at parallel bars: Red tband around BLE: Hip abduction x15 bilaterally; Hip extension x10 bilaterally; Side stepping x10 feet x2 laps each direction; Patient required min-moderate verbal/tactile cues for correct exercise technique including cues to improve erect posture and to keep knee straight for better hip strengthening; Patient able to don/doff red band independently;  Standing: Heel/toe raises x15 High knee march x10 with 1 rail assist;  Required mod VCs to increase ROM for better strengthening/stretching;  Standing at steps with B rails: Forward/backward step ups x5 leading with each foot with CGA for safety;  Balance: Alternate toe taps on 4 inch step with 1 rail assist x20 bilaterally  with CGA for safety and cues to improve foot clearance, erect posture and better weight shift; Forward/backward walking with 1 rail assist, CGA to close supervision (10 feet) x3 laps each direction;   Nustep BLE ony level 2 x4 min with HEP instruction;   Patient denies any pain after treatment session but does report some fatigue;                        PT Education - 02/14/17 0915    Education provided Yes   Education Details Exercise technique, strengthening/balance;    Person(s)  Educated Patient   Methods Explanation;Verbal cues   Comprehension Verbalized understanding;Returned demonstration;Verbal cues required             PT Long Term Goals - 02/06/17 1423      PT LONG TERM GOAL #1   Title Patient will be independent in home exercise program to improve strength/mobility for better functional independence with ADLs.   Time 8   Period Weeks   Status New     PT LONG TERM GOAL #2   Title Patient (> 58 years old) will complete five times sit to stand test in < 15 seconds indicating an increased LE strength and improved balance.   Time 8   Period Weeks   Status New     PT LONG TERM GOAL #3   Title Patient will increase Berg Balance score by > 6 points to demonstrate decreased fall risk during functional activities.   Time 8   Period Weeks   Status New     PT LONG TERM GOAL #4   Title Patient will increase 10 meter walk test to >1.48m/s as to improve gait speed for better community ambulation and to reduce fall risk.   Time 8   Period Weeks   Status New     PT LONG TERM GOAL #5   Title Patient will increase BLE gross strength to 4+/5 as to improve functional strength for independent gait, increased standing tolerance and increased ADL ability.   Time 8   Period Weeks   Status New               Plan - 02/14/17 1004    Clinical Impression Statement Patient instructed in advanced strengthening and balance exercise. He is very hard of hearing and requires several cues for erect posture and correct exercise technique. He requires at least 1 rail assist with all standing exercise. Patient denies any increase in pain with advanced exercise, but does seem to fatigue. He requested to reduce therapy to 1x a week. Instructed patient that he would have to do more exercises at home for progression. Patient agreeable. His son mentioned that he can help with some exercise as well. Patient would benefit from additional skilled PT intervention to improve  strength, balance and gait safety;    Rehab Potential Good   Clinical Impairments Affecting Rehab Potential positive: motivated, good caregiver support; negative: co-morbidities, age, fall risk; Patient's clinical presentation is evolving as he has had multiple recent falls and exhibits significant weakness and gait instability;    PT Frequency 2x / week   PT Duration 8 weeks   PT Treatment/Interventions Cryotherapy;Moist Heat;Neuromuscular re-education;Balance training;Therapeutic exercise;Therapeutic activities;Functional mobility training;Stair training;Gait training;DME Instruction;Patient/family education;Passive range of motion;Energy conservation   PT Next Visit Plan 6 min walk test, balance/strengthening   PT Home Exercise Plan advanced- see patient instructions;    Consulted and Agree with Plan of Care Patient  Patient will benefit from skilled therapeutic intervention in order to improve the following deficits and impairments:  Abnormal gait, Decreased endurance, Hypomobility, Decreased activity tolerance, Decreased strength, Difficulty walking, Decreased mobility, Decreased balance, Decreased range of motion, Improper body mechanics, Postural dysfunction, Impaired flexibility, Decreased safety awareness  Visit Diagnosis: Abnormality of gait  Unsteadiness  Muscle weakness (generalized)     Problem List Patient Active Problem List   Diagnosis Date Noted  . CAP (community acquired pneumonia) 01/27/2015  . Sepsis ([place]) 01/27/2015  . Community acquired bacterial pneumonia 01/27/2015  . Stroke ([place])   . Arthritis   . Carotid artery occlusion   . CVA (cerebral vascular accident) ([place])   . Hyperlipidemia   . Hypertension   . BPH (benign prostatic hyperplasia)   . Skin cancer   . Weakness   . Pain in limb-Right trunk 07/18/2013  . Occlusion and stenosis of carotid artery without mention of cerebral infarction 07/18/2013  . TIA (transient ischemic attack) 09/15/2011  .  Diabetes mellitus without complication ([place]) 02/33/4356  . PERSONAL HISTORY OF COLONIC POLYPS 10/28/2009    Doryce Mcgregory PT, DPT 02/14/2017, 10:07 AM  [place] MAIN [facility] SERVICES [address] [place], [place], [address] Phone: [phone]   Fax:  [phone]  Name: Gregory Padilla MRN: [id number] Date of Birth: 10-26-1924

## 2017-02-14 NOTE — Patient Instructions (Addendum)
Balance, Proprioception: Hip Abduction With Tubing   With tubing attached to both ankles, Standing holding onto counter, kick one leg out to side and then Return.  Repeat _10___ times  On each side.  Do ___2_ sessions per day.  http://cc.exer.us/20     Balance, Proprioception: Hip Extension With Tubing   With tubing tied around both legs, holding onto kitchen counter, swing leg back. Return. Repeat _10___ times . Do __2__ sessions per day.  http://cc.exer.us/19   .  Band Walk: Side Stepping   Tie band around legs, AROUND ANKLES. Step _10__ feet to one side, then step back to start. Repeat _2-3__ feet per session. Note: Small towel between band and skin eases rubbing.  http://plyo.exer.us/76   Copyright  VHI. All rights reserved.     Toe / Heel Raise    Gently rock back on heels and raise toes. Then rock forward on toes and raise heels. Repeat sequence __15__ times per session. Do _2___ sessions per day Copyright  VHI. All rights reserved.  March    Lift knee toward chest to 90 bend, then lower leg as knee is straightened. Session:Do 15 marches each leg; Hold onto counter for safety;  Do 2x a day;    Copyright  VHI. All rights reserved.  Backward Walking    Stand beside counter, walk forward using 1 hand on counter for safety; Then walk backwards. Repeat 3 laps each direction  (HAVE SOMEONE WITH YOU FOR SAFETY)   Copyright  VHI. All rights reserved.

## 2017-02-15 ENCOUNTER — Ambulatory Visit: Payer: Medicare Other | Admitting: Physical Therapy

## 2017-02-20 ENCOUNTER — Ambulatory Visit: Payer: Medicare Other

## 2017-02-20 DIAGNOSIS — R269 Unspecified abnormalities of gait and mobility: Secondary | ICD-10-CM | POA: Diagnosis not present

## 2017-02-20 DIAGNOSIS — M6281 Muscle weakness (generalized): Secondary | ICD-10-CM

## 2017-02-20 DIAGNOSIS — R2681 Unsteadiness on feet: Secondary | ICD-10-CM

## 2017-02-20 NOTE — Therapy (Signed)
[place] MAIN [facility] SERVICES [address] [place], [place], [address] Phone: [phone]   Fax:  [phone]  Physical Therapy Treatment  Patient Details  Name: Gregory Padilla MRN: [id number] Date of Birth: 01/15/1925 Referring Provider: Dr. Manuella Ghazi  Encounter Date: 02/20/2017      PT End of Session - 02/20/17 1108    Visit Number 3   Number of Visits 17   Date for PT Re-Evaluation 04/03/17   Authorization Type gcode 3   Authorization Time Period 10   PT Start Time 1015   PT Stop Time 1101   PT Time Calculation (min) 46 min   Equipment Utilized During Treatment --  min guard to S prn   Activity Tolerance Patient tolerated treatment well;Patient limited by fatigue   Behavior During Therapy [facility] for tasks assessed/performed      Past Medical History:  Diagnosis Date  . Arthritis   . BPH (benign prostatic hyperplasia)   . Carotid artery occlusion   . Coccidioidomycosis   . CVA (cerebral vascular accident) ([place])    left sided weakness  . Degenerative joint disease   . Diabetes mellitus age 64   no insulin  . Hyperlipidemia   . Hypertension   . Meniere disease   . Skin cancer    history of skin cancer  . Stroke [facility])     Past Surgical History:  Procedure Laterality Date  . BACK SURGERY  2004  . CHOLECYSTECTOMY    . PROSTATE SURGERY     x2    There were no vitals filed for this visit.      Subjective Assessment - 02/20/17 1019    Subjective Pt reports he comes close to falling quite often, mostly when he is standing and turning his head.    Pertinent History personal factors affecting rehab: high fall risk, lives with wife who has limited mobility, not driving but has transportation assist from family, HTN/Diabetes (controlled); impaired memory, ramped entrance in home, tub/shower but does have shower seat; past strokes;    Patient Stated Goals "I want to move around better" reduce falls,    Currently in Pain?  No/denies       Therex:     [facility] PT Assessment - 02/20/17 1021      6 Minute Walk- Baseline   6 Minute Walk- Baseline yes   BP (mmHg) 128/54   HR (bpm) 59   02 Sat (%RA) 100 %   Modified Borg Scale for Dyspnea 0- Nothing at all   Perceived Rate of Exertion (Borg) 6-     6 Minute walk- Post Test   6 Minute Walk Post Test yes   BP (mmHg) 141/64   HR (bpm) 67   02 Sat (%RA) 100 %   Modified Borg Scale for Dyspnea 3- Moderate shortness of breath or breathing difficulty   Perceived Rate of Exertion (Borg) 13- Somewhat hard     6 minute walk test results    Aerobic Endurance Distance Walked 705   Endurance additional comments with RW    Pt required seated rest break during and after 6MWT 2/2 fatigue and L knee buckling.                 [facility] Adult PT Treatment/Exercise - 02/20/17 1104      High Level Balance   High Level Balance Activities Side stepping;Backward walking;Marching forwards;Other (comment)  heel/toe walking   High Level Balance Comments Performed in // bars with BUE  support and min guard to S for safety: pt performed each activity with cues and demo for technique and improved upright posture. Cues to improve narrow BOS, stride length and lateral weight shifting. Pt required seated rest break after balance activities 2/2 fatigue.                 PT Education - 02/20/17 1105    Education provided Yes   Education Details PT educated pt on walking program, 6MWT results and vitals. PT encouraged pt to perform HEP as instructed.              PT Long Term Goals - 02/06/17 1423      PT LONG TERM GOAL #1   Title Patient will be independent in home exercise program to improve strength/mobility for better functional independence with ADLs.   Time 8   Period Weeks   Status New     PT LONG TERM GOAL #2   Title Patient (> 70 years old) will complete five times sit to stand test in < 15 seconds indicating an increased LE strength and improved  balance.   Time 8   Period Weeks   Status New     PT LONG TERM GOAL #3   Title Patient will increase Berg Balance score by > 6 points to demonstrate decreased fall risk during functional activities.   Time 8   Period Weeks   Status New     PT LONG TERM GOAL #4   Title Patient will increase 10 meter walk test to >1.76m/s as to improve gait speed for better community ambulation and to reduce fall risk.   Time 8   Period Weeks   Status New     PT LONG TERM GOAL #5   Title Patient will increase BLE gross strength to 4+/5 as to improve functional strength for independent gait, increased standing tolerance and increased ADL ability.   Time 8   Period Weeks   Status New               Plan - 02/20/17 1108    Clinical Impression Statement Pt's 6MWT results are below the normative value for his age group (90-94y/o), normal is 1209' and pt amb. 705' and required one seated rest break during testing and one seated rest break after test. Pt's diastolic BP increased by 08QPYP after walking test, therefore, PT will continue to monitor BP during sessions to ensure safety. Pt continues to require cues to improve stride length, heel strike, decr. narrow BOS during amb. and high level balance activities. Pt would also benefit from postural exercises next session and continued skilled PT to improve safety during functional mobility.    Rehab Potential Good   Clinical Impairments Affecting Rehab Potential positive: motivated, good caregiver support; negative: co-morbidities, age, fall risk; Patient's clinical presentation is evolving as he has had multiple recent falls and exhibits significant weakness and gait instability;    PT Frequency 2x / week   PT Duration 8 weeks   PT Treatment/Interventions Cryotherapy;Moist Heat;Neuromuscular re-education;Balance training;Therapeutic exercise;Therapeutic activities;Functional mobility training;Stair training;Gait training;DME Instruction;Patient/family  education;Passive range of motion;Energy conservation   PT Next Visit Plan Continue high level balance/strengthening and postural improvement exercises. Perform STS txfs to improve strength and eccentric control.   PT Home Exercise Plan advanced- see patient instructions;    Consulted and Agree with Plan of Care Patient      Patient will benefit from skilled therapeutic intervention in order to improve the following deficits  and impairments:  Abnormal gait, Decreased endurance, Hypomobility, Decreased activity tolerance, Decreased strength, Difficulty walking, Decreased mobility, Decreased balance, Decreased range of motion, Improper body mechanics, Postural dysfunction, Impaired flexibility, Decreased safety awareness  Visit Diagnosis: Unsteadiness  Muscle weakness (generalized)     Problem List Patient Active Problem List   Diagnosis Date Noted  . CAP (community acquired pneumonia) 01/27/2015  . Sepsis ([place]) 01/27/2015  . Community acquired bacterial pneumonia 01/27/2015  . Stroke ([place])   . Arthritis   . Carotid artery occlusion   . CVA (cerebral vascular accident) ([place])   . Hyperlipidemia   . Hypertension   . BPH (benign prostatic hyperplasia)   . Skin cancer   . Weakness   . Pain in limb-Right trunk 07/18/2013  . Occlusion and stenosis of carotid artery without mention of cerebral infarction 07/18/2013  . TIA (transient ischemic attack) 09/15/2011  . Diabetes mellitus without complication ([place]) 42/68/3419  . PERSONAL HISTORY OF COLONIC POLYPS 10/28/2009    Seanpatrick Maisano L 02/20/2017, 11:12 AM  [place] MAIN [facility] SERVICES [address] [place], [place], [address] Phone: [phone]   Fax:  [phone]  Name: Gregory Padilla MRN: [id number] Date of Birth: [birth year]  Geoffry Paradise, PT,DPT 02/20/17 11:13 AM Phone: [phone] Fax: [phone]

## 2017-02-20 NOTE — Patient Instructions (Signed)
Walking Program:  Begin walking for exercise for 4 minutes, 1-2 times/day, 5 days/week.   Progress your walking program by adding 1 minutes to your routine each week, as tolerated. Be sure to wear good walking shoes, walk in a safe environment and only progress to your tolerance.

## 2017-02-22 ENCOUNTER — Ambulatory Visit: Payer: Medicare Other

## 2017-02-27 ENCOUNTER — Encounter: Payer: Self-pay | Admitting: Physical Therapy

## 2017-02-27 ENCOUNTER — Ambulatory Visit: Payer: Medicare Other | Admitting: Physical Therapy

## 2017-02-27 VITALS — BP 132/58

## 2017-02-27 DIAGNOSIS — R2681 Unsteadiness on feet: Secondary | ICD-10-CM

## 2017-02-27 DIAGNOSIS — M6281 Muscle weakness (generalized): Secondary | ICD-10-CM

## 2017-02-27 DIAGNOSIS — R269 Unspecified abnormalities of gait and mobility: Secondary | ICD-10-CM | POA: Diagnosis not present

## 2017-02-27 NOTE — Therapy (Signed)
[place] MAIN [facility] SERVICES [address] [place], [place], [address] Phone: [phone]   Fax:  [phone]  Physical Therapy Treatment  Patient Details  Name: Gregory Padilla MRN: [id number] Date of Birth: 06/19/1925 Referring Provider: Dr. Manuella Ghazi  Encounter Date: 02/27/2017      PT End of Session - 02/27/17 1101    Visit Number 4   Number of Visits 17   Date for PT Re-Evaluation 04/03/17   Authorization Type gcode 4   Authorization Time Period 10   PT Start Time 1101   PT Stop Time 1151   PT Time Calculation (min) 50 min   Equipment Utilized During Treatment --  min guard to S prn   Activity Tolerance Patient tolerated treatment well;Patient limited by fatigue   Behavior During Therapy [facility] for tasks assessed/performed      Past Medical History:  Diagnosis Date  . Arthritis   . BPH (benign prostatic hyperplasia)   . Carotid artery occlusion   . Coccidioidomycosis   . CVA (cerebral vascular accident) ([place])    left sided weakness  . Degenerative joint disease   . Diabetes mellitus age 83   no insulin  . Hyperlipidemia   . Hypertension   . Meniere disease   . Skin cancer    history of skin cancer  . Stroke [facility])     Past Surgical History:  Procedure Laterality Date  . BACK SURGERY  2004  . CHOLECYSTECTOMY    . PROSTATE SURGERY     x2    Vitals:   02/27/17 1111  BP: (!) 132/58        Subjective Assessment - 02/27/17 1110    Subjective Pt reports his goal with therapy is to remain active.  He says he finds himself stiff and unsteady in the morning, making it difficult to stand and shave.  He reports fatiguing with HEP but is completing without questions.     Pertinent History personal factors affecting rehab: high fall risk, lives with wife who has limited mobility, not driving but has transportation assist from family, HTN/Diabetes (controlled); impaired memory, ramped entrance in home, tub/shower but does have  shower seat; past strokes;    Patient Stated Goals "I want to move around better" reduce falls,    Currently in Pain? No/denies       TREATMENT   Therapeutic Exercise:  Hip abduction 2x10 bilaterally with RTB around ankles, fatigue at end of each set  Hip extension 2x10 bilaterally with RTB around ankles;  Side stepping x10 feet x2 laps each direction;  Seated Bil hip ER/Abd with RTB around knees 2x20  Standing marching with RTB x20 each LE  Lateral stepping over  foam roll with flat side down x15 each direction  Forward and backward stepping over 1/2 foam roll with flat side down x10  Seated prayer stretch with theraball with 5 second holds x10 for gentle stretch as pt reports difficulty reaching down to tie his shoes  Max verbal cues for upright posture and forward gaze throughout session during ambulation and therapeutic exercises in standing.    Neuromuscular Re-ed:  Alternating toe taps from airex up to 8" step with 1UE supported x20 each LE  Tandem stance x30 seconds each with each LE forward with intermittent UE support  Ambulating in // bars over airex pads and flat surface x8 lengths  Lateral stepping up and over airex pad x10 each direction  PT Education - 02/27/17 1101    Education provided Yes   Education Details Exercise technique; posture; gait mechanics   Person(s) Educated Patient   Methods Explanation;Demonstration;Verbal cues   Comprehension Verbalized understanding;Returned demonstration;Verbal cues required;Need further instruction             PT Long Term Goals - 02/06/17 1423      PT LONG TERM GOAL #1   Title Patient will be independent in home exercise program to improve strength/mobility for better functional independence with ADLs.   Time 8   Period Weeks   Status New     PT LONG TERM GOAL #2   Title Patient (> 81 years old) will complete five times sit to stand test in < 15 seconds indicating an increased LE strength and  improved balance.   Time 8   Period Weeks   Status New     PT LONG TERM GOAL #3   Title Patient will increase Berg Balance score by > 6 points to demonstrate decreased fall risk during functional activities.   Time 8   Period Weeks   Status New     PT LONG TERM GOAL #4   Title Patient will increase 10 meter walk test to >1.49m/s as to improve gait speed for better community ambulation and to reduce fall risk.   Time 8   Period Weeks   Status New     PT LONG TERM GOAL #5   Title Patient will increase BLE gross strength to 4+/5 as to improve functional strength for independent gait, increased standing tolerance and increased ADL ability.   Time 8   Period Weeks   Status New               Plan - 02/27/17 1127    Clinical Impression Statement Pt presents with flexed posture and decreased Bil hip flexion and knee flexion when ambulating.  Provided pt with max verbal cues for upright posture and forward gaze throughout session when ambulating and with standing exercises.  He demonstrates fatigue with strengthening exercise but does not require rest breaks.  Pt will benefit from continued skilled PT interventions for improved posture, strength, gait mechanics, and to decrease his fall risk.   Rehab Potential Good   Clinical Impairments Affecting Rehab Potential positive: motivated, good caregiver support; negative: co-morbidities, age, fall risk; Patient's clinical presentation is evolving as he has had multiple recent falls and exhibits significant weakness and gait instability;    PT Frequency 2x / week   PT Duration 8 weeks   PT Treatment/Interventions Cryotherapy;Moist Heat;Neuromuscular re-education;Balance training;Therapeutic exercise;Therapeutic activities;Functional mobility training;Stair training;Gait training;DME Instruction;Patient/family education;Passive range of motion;Energy conservation   PT Next Visit Plan Continue high level balance/strengthening and postural  improvement exercises   PT Home Exercise Plan advanced- see patient instructions;    Consulted and Agree with Plan of Care Patient      Patient will benefit from skilled therapeutic intervention in order to improve the following deficits and impairments:  Abnormal gait, Decreased endurance, Hypomobility, Decreased activity tolerance, Decreased strength, Difficulty walking, Decreased mobility, Decreased balance, Decreased range of motion, Improper body mechanics, Postural dysfunction, Impaired flexibility, Decreased safety awareness  Visit Diagnosis: Unsteadiness  Muscle weakness (generalized)     Problem List Patient Active Problem List   Diagnosis Date Noted  . CAP (community acquired pneumonia) 01/27/2015  . Sepsis ([place]) 01/27/2015  . Community acquired bacterial pneumonia 01/27/2015  . Stroke ([place])   . Arthritis   . Carotid artery occlusion   .  CVA (cerebral vascular accident) ([place])   . Hyperlipidemia   . Hypertension   . BPH (benign prostatic hyperplasia)   . Skin cancer   . Weakness   . Pain in limb-Right trunk 07/18/2013  . Occlusion and stenosis of carotid artery without mention of cerebral infarction 07/18/2013  . TIA (transient ischemic attack) 09/15/2011  . Diabetes mellitus without complication ([place]) 42/35/3614  . PERSONAL HISTORY OF COLONIC POLYPS 10/28/2009    Collie Siad PT, DPT 02/27/2017, 12:17 PM  [place] MAIN [facility] SERVICES [address] [place], [place], [address] Phone: [phone]   Fax:  [phone]  Name: OTTIE TILLERY MRN: [id number] Date of Birth: December 12, 1924

## 2017-03-01 ENCOUNTER — Ambulatory Visit: Payer: Medicare Other | Admitting: Physical Therapy

## 2017-03-07 ENCOUNTER — Ambulatory Visit: Payer: Medicare Other

## 2017-03-07 DIAGNOSIS — R2689 Other abnormalities of gait and mobility: Secondary | ICD-10-CM

## 2017-03-07 DIAGNOSIS — R269 Unspecified abnormalities of gait and mobility: Secondary | ICD-10-CM

## 2017-03-07 DIAGNOSIS — R2681 Unsteadiness on feet: Secondary | ICD-10-CM

## 2017-03-07 DIAGNOSIS — M6281 Muscle weakness (generalized): Secondary | ICD-10-CM

## 2017-03-07 NOTE — Therapy (Signed)
[place] MAIN [facility] SERVICES [address] [place], [place], [address] Phone: [phone]   Fax:  [phone]  Physical Therapy Treatment  Patient Details  Name: Gregory Padilla MRN: [id number] Date of Birth: 08/07/1925 Referring Provider: Dr. Manuella Ghazi  Encounter Date: 03/07/2017      PT End of Session - 03/07/17 1219    Visit Number 5   Number of Visits 17   Date for PT Re-Evaluation 04/03/17   Authorization Type gcode 5   Authorization Time Period 10   PT Start Time 1116   PT Stop Time 1201   PT Time Calculation (min) 45 min   Equipment Utilized During Treatment --  min guard to S prn   Activity Tolerance Patient tolerated treatment well;Patient limited by fatigue   Behavior During Therapy [facility] for tasks assessed/performed      Past Medical History:  Diagnosis Date  . Arthritis   . BPH (benign prostatic hyperplasia)   . Carotid artery occlusion   . Coccidioidomycosis   . CVA (cerebral vascular accident) ([place])    left sided weakness  . Degenerative joint disease   . Diabetes mellitus age 52   no insulin  . Hyperlipidemia   . Hypertension   . Meniere disease   . Skin cancer    history of skin cancer  . Stroke [facility])     Past Surgical History:  Procedure Laterality Date  . BACK SURGERY  2004  . CHOLECYSTECTOMY    . PROSTATE SURGERY     x2    There were no vitals filed for this visit.      Subjective Assessment - 03/07/17 1217    Subjective Pt. and son report that patient has fallen 6x since last visit. Falls primarily occur in the evening with the patient more fatigued often tripping over himself. Pt. has not had any pain prior, during, or after falls.    Pertinent History personal factors affecting rehab: high fall risk, lives with wife who has limited mobility, not driving but has transportation assist from family, HTN/Diabetes (controlled); impaired memory, ramped entrance in home, tub/shower but does have shower  seat; past strokes;    Patient Stated Goals "I want to move around better" reduce falls,    Currently in Pain? No/denies     Neuro Re-ed Step over and back half foam roller  Alternating toe taps from airex up to 8" step with UE supported x20 each LE   TherEx Side stepping in // bars 8x with BUE support Standing marches in // bars with BUE support and cues for upright posture 2x20 Walking with foam roller between feet to promote wider BOS 3x length of // bars and BUE support. Ambulating w/o foam roller guide 4x length of // bars to demonstrate carryover Ambulating 2x15ft with FWW and CGA.  Sit to stand from raised plinth table. Cues for sequencing and positioning of hand .2x10 Seated RTB 20x abduction  Max verbal cues for upright posture and forward gaze throughout session during ambulation and therapeutic exercises in standing.   Pt. response to medical necessity:Patient will benefit from skilled physical therapy intervention to improve strength, balance, and ambulation for improved safety in his natural environment.       PT Education - 03/07/17 1218    Education provided Yes   Education Details widening BOS while ambulating to prevent falls, sit to stand body mechanics   Person(s) Educated Patient   Methods Explanation;Demonstration   Comprehension Verbalized understanding;Returned demonstration  PT Long Term Goals - 02/06/17 1423      PT LONG TERM GOAL #1   Title Patient will be independent in home exercise program to improve strength/mobility for better functional independence with ADLs.   Time 8   Period Weeks   Status New     PT LONG TERM GOAL #2   Title Patient (> 28 years old) will complete five times sit to stand test in < 15 seconds indicating an increased LE strength and improved balance.   Time 8   Period Weeks   Status New     PT LONG TERM GOAL #3   Title Patient will increase Berg Balance score by > 6 points to demonstrate decreased fall  risk during functional activities.   Time 8   Period Weeks   Status New     PT LONG TERM GOAL #4   Title Patient will increase 10 meter walk test to >1.33m/s as to improve gait speed for better community ambulation and to reduce fall risk.   Time 8   Period Weeks   Status New     PT LONG TERM GOAL #5   Title Patient will increase BLE gross strength to 4+/5 as to improve functional strength for independent gait, increased standing tolerance and increased ADL ability.   Time 8   Period Weeks   Status New               Plan - 03/07/17 1222    Clinical Impression Statement Patient presents to therapy reporting 6 falls since last visit. Pt.'s falls are correlated with fatigue and tripping over his own feet. Ambulating with a focus on wider BOS was focused on throughout session with pt. demonstrating carryover. Balance is challenged by LE weakness. Proper body mechanics utilizing plinth table and FWW for sit to stand.  Patient will benefit from skilled physical therapy intervention to improve strength, balance, and ambulation for improved safety in his natural environment.    Rehab Potential Good   Clinical Impairments Affecting Rehab Potential positive: motivated, good caregiver support; negative: co-morbidities, age, fall risk; Patient's clinical presentation is evolving as he has had multiple recent falls and exhibits significant weakness and gait instability;    PT Frequency 2x / week   PT Duration 8 weeks   PT Treatment/Interventions Cryotherapy;Moist Heat;Neuromuscular re-education;Balance training;Therapeutic exercise;Therapeutic activities;Functional mobility training;Stair training;Gait training;DME Instruction;Patient/family education;Passive range of motion;Energy conservation   PT Next Visit Plan Continue high level balance/strengthening and postural improvement exercises, fall prevention   PT Home Exercise Plan advanced- see patient instructions;    Consulted and Agree with  Plan of Care Patient      Patient will benefit from skilled therapeutic intervention in order to improve the following deficits and impairments:  Abnormal gait, Decreased endurance, Hypomobility, Decreased activity tolerance, Decreased strength, Difficulty walking, Decreased mobility, Decreased balance, Decreased range of motion, Improper body mechanics, Postural dysfunction, Impaired flexibility, Decreased safety awareness  Visit Diagnosis: Unsteadiness  Muscle weakness (generalized)  Abnormality of gait  Balance problems     Problem List Patient Active Problem List   Diagnosis Date Noted  . CAP (community acquired pneumonia) 01/27/2015  . Sepsis ([place]) 01/27/2015  . Community acquired bacterial pneumonia 01/27/2015  . Stroke ([place])   . Arthritis   . Carotid artery occlusion   . CVA (cerebral vascular accident) ([place])   . Hyperlipidemia   . Hypertension   . BPH (benign prostatic hyperplasia)   . Skin cancer   . Weakness   .  Pain in limb-Right trunk 07/18/2013  . Occlusion and stenosis of carotid artery without mention of cerebral infarction 07/18/2013  . TIA (transient ischemic attack) 09/15/2011  . Diabetes mellitus without complication ([place]) 14/43/1540  . PERSONAL HISTORY OF COLONIC POLYPS 10/28/2009   Janna Arch, PT, DPT  Janna Arch 03/07/2017, 12:28 PM  [place] MAIN [facility] SERVICES [address] [place], [place], [address] Phone: [phone]   Fax:  [phone]  Name: Gregory Padilla MRN: [id number] Date of Birth: [birth year]

## 2017-03-13 ENCOUNTER — Ambulatory Visit: Payer: Medicare Other | Admitting: Physical Therapy

## 2017-03-15 ENCOUNTER — Ambulatory Visit: Payer: Medicare Other | Admitting: Physical Therapy

## 2017-03-20 ENCOUNTER — Ambulatory Visit: Payer: Medicare Other | Admitting: Physical Therapy

## 2017-03-22 ENCOUNTER — Ambulatory Visit: Payer: Medicare Other | Admitting: Physical Therapy

## 2017-03-27 ENCOUNTER — Ambulatory Visit: Payer: Medicare Other | Admitting: Physical Therapy

## 2017-03-29 ENCOUNTER — Ambulatory Visit: Payer: Medicare Other | Admitting: Physical Therapy

## 2017-04-03 ENCOUNTER — Ambulatory Visit: Payer: Medicare Other | Admitting: Physical Therapy

## 2017-04-05 ENCOUNTER — Ambulatory Visit: Payer: Medicare Other | Admitting: Physical Therapy

## 2017-05-16 DIAGNOSIS — C4491 Basal cell carcinoma of skin, unspecified: Secondary | ICD-10-CM

## 2017-05-16 HISTORY — DX: Basal cell carcinoma of skin, unspecified: C44.91

## 2017-08-10 ENCOUNTER — Ambulatory Visit: Payer: Medicare Other | Admitting: Family

## 2017-08-10 ENCOUNTER — Encounter (HOSPITAL_COMMUNITY): Payer: Medicare Other

## 2017-09-14 ENCOUNTER — Ambulatory Visit (HOSPITAL_COMMUNITY)
Admission: RE | Admit: 2017-09-14 | Discharge: 2017-09-14 | Disposition: A | Discharge status: Home / Self Care | Payer: Medicare Other | Source: Ambulatory Visit | Attending: Family | Admitting: Family

## 2017-09-14 ENCOUNTER — Encounter: Payer: Self-pay | Admitting: Family

## 2017-09-14 ENCOUNTER — Ambulatory Visit (INDEPENDENT_AMBULATORY_CARE_PROVIDER_SITE_OTHER): Payer: Medicare Other | Admitting: Family

## 2017-09-14 VITALS — BP 137/75 | HR 99 | Temp 97.1°F | Resp 17 | Wt 181.1 lb

## 2017-09-14 DIAGNOSIS — I6523 Occlusion and stenosis of bilateral carotid arteries: Secondary | ICD-10-CM | POA: Insufficient documentation

## 2017-09-14 DIAGNOSIS — R609 Edema, unspecified: Secondary | ICD-10-CM | POA: Diagnosis not present

## 2017-09-14 DIAGNOSIS — Z8673 Personal history of transient ischemic attack (TIA), and cerebral infarction without residual deficits: Secondary | ICD-10-CM | POA: Insufficient documentation

## 2017-09-14 LAB — VAS US CAROTID
LCCADDIAS: 9 cm/s
LCCADSYS: 53 cm/s
LEFT ECA DIAS: 0 cm/s
LICADDIAS: -18 cm/s
LICADSYS: -73 cm/s
LICAPDIAS: 40 cm/s
LICAPSYS: 189 cm/s
Left CCA prox dias: 0 cm/s
Left CCA prox sys: 96 cm/s
RCCADSYS: -77 cm/s
RCCAPSYS: 71 cm/s
RIGHT ECA DIAS: 0 cm/s
Right CCA prox dias: 0 cm/s

## 2017-09-14 NOTE — Progress Notes (Signed)
Chief Complaint: Follow up Extracranial Carotid Artery Stenosis   History of Present Illness  KERIN CECCHI is a 83 y.o. male patient whom Dr. Kellie Simmering has been monitoring for extracranial carotid artery occlusive disease. He returns today for followup. He suffered a small left brain stroke in 2012 following a TURP by Dr. Gaynelle Arabian, he also had a remote history of a right brain stroke about 2009 with some mild left-sided weakness. He had evidence of mild to moderate left internal carotid stenosis.  He reports a TIA the Summer of 2017 while in his yard; he did not get evaluated for this. His symptoms were feeling woozy. He states it was hot that day.  He denies vision changes, denies hemiparesis, denies speech difficulties.  He reports that he has Meniere's Disease, and has hearing loss from this, wears bilateral hearing aids, but states his balance is mildly off due to many small strokes. The patient denies a history of amaurosis fugax or monocular blindness, denies a history of receptive or expressive aphasia.   His primary concern seems to be his generalized weakness and dizziness. He has falling issues, about once/week for the last 3 weeks.   Pt Diabetic: Yes, states in control Pt smoker: non-smoker  Pt meds include: Statin : Yes ASA: no Other anticoagulants/antiplatelets: Plavix   Past Medical History:  Diagnosis Date  . Arthritis   . BPH (benign prostatic hyperplasia)   . Carotid artery occlusion   . Coccidioidomycosis   . CVA (cerebral vascular accident) ([place])    left sided weakness  . Degenerative joint disease   . Diabetes mellitus age 80   no insulin  . Hyperlipidemia   . Hypertension   . Meniere disease   . Skin cancer    history of skin cancer  . Stroke [facility])     Social History Social History   Tobacco Use  . Smoking status: Never Smoker  . Smokeless tobacco: Never Used  Substance Use Topics  . Alcohol use: No    Alcohol/week: 0.0 oz  . Drug  use: No    Family History Family History  Problem Relation Age of Onset  . Diabetes Mother   . Arthritis Mother   . Coronary artery disease Father   . Heart disease Father   . Stroke Neg Hx     Surgical History Past Surgical History:  Procedure Laterality Date  . BACK SURGERY  2004  . CHOLECYSTECTOMY    . PROSTATE SURGERY     x2    Allergies  Allergen Reactions  . Amitriptyline     unknown    Current Outpatient Medications  Medication Sig Dispense Refill  . acetaminophen (TYLENOL) 500 MG tablet Take 1,000 mg by mouth at bedtime. For fever     . ALPRAZolam (XANAX) 0.25 MG tablet Take 0.25 mg by mouth at bedtime as needed. For anxiety    . Ascorbic Acid (VITAMIN C PO) Take 1,000 mg by mouth daily.     Marland Kitchen aspirin 81 MG tablet Take 81 mg by mouth daily.      . calcium citrate-vitamin D 200-200 MG-UNIT TABS Take 1 tablet by mouth 2 (two) times daily.      . clopidogrel (PLAVIX) 75 MG tablet Take 75 mg by mouth daily.      Marland Kitchen dicyclomine (BENTYL) 10 MG capsule Take 10 mg by mouth 3 (three) times daily as needed for spasms.    . diphenhydrAMINE (SOMINEX) 25 MG tablet Take 25 mg by mouth at bedtime  as needed. For allergy    . donepezil (ARICEPT) 10 MG tablet Take by mouth.    . esomeprazole (NEXIUM) 40 MG capsule Take 40 mg by mouth daily before breakfast.      . ipratropium (ATROVENT) 0.03 % nasal spray Place into the nose.    Marland Kitchen ketoconazole (NIZORAL) 200 MG tablet Take by mouth daily.    . Linaclotide (LINZESS) 145 MCG CAPS capsule Take 1 capsule (145 mcg total) by mouth daily. 120 capsule 0  . metFORMIN (GLUCOPHAGE) 500 MG tablet Take 1 tablet (500 mg total) by mouth 2 (two) times daily with a meal. 60 tablet 0  . mirabegron ER (MYRBETRIQ) 50 MG TB24 tablet Take 50 mg by mouth at bedtime.    . Multiple Vitamin (MULTIVITAMIN WITH MINERALS) TABS tablet Take 1 tablet by mouth daily.    Vladimir Faster Glycol-Propyl Glycol (SYSTANE OP) Place 1-2 drops into both eyes 2 (two) times  daily.     Vladimir Faster Glycol-Propyl Glycol 0.4-0.3 % GEL Apply 1 application to eye at bedtime.    . polyethylene glycol powder (MIRALAX) powder Take 17 g by mouth daily as needed for mild constipation.     . psyllium (METAMUCIL) 58.6 % powder Take 1 packet by mouth 3 (three) times daily.    . rosuvastatin (CRESTOR) 5 MG tablet Take 5 mg by mouth daily.      . tamsulosin (FLOMAX) 0.4 MG CAPS capsule Take 0.8 mg by mouth daily.    Marland Kitchen buPROPion (WELLBUTRIN) 100 MG tablet      No current facility-administered medications for this visit.     Review of Systems : See HPI for pertinent positives and negatives.  Physical Examination  Vitals:   09/14/17 1012 09/14/17 1015  BP: 137/65 137/75  Pulse: 99   Resp: 17   Temp: (!) 97.1 F (36.2 C)   TempSrc: Oral   SpO2: 98%   Weight: 181 lb 1.6 oz (82.1 kg)    Body mass index is 25.99 kg/m.  General: WDWN male in NAD GAIT:slow and deliberate, using walker Eyes: PERRLA Pulmonary: Respirations are non labored, CTAB, good air movement in all fields.   Cardiac: regular rhythm, positive murmur.  VASCULAR EXAM Carotid Bruits Left Right   Transmitted cardiac murmer Transmitted cardiac murmer    Abdominal aortic pulse is not palpable. Radial pulses are 2+ palpable and equal.      LE Pulses LEFT RIGHT   POPLITEAL not palpable  not palpable   POSTERIOR TIBIAL  palpable   palpable    DORSALIS PEDIS  ANTERIOR TIBIAL palpable  palpable     Gastrointestinal:no tenderness, normal pitched bowel sounds, no palpable masses.  Musculoskeletal: No muscle atrophy/wasting. M/S 5/5 throughout, Extremities without ischemic changes.  Neurologic: A&O X 3; appropriate affect; SENSATION ;normal; speech is normal, CN 2-12 intact except hard of hearing, Pain and light touch  intact in extremities, Motor exam as listed above.   Assessment: JAYANTH SZCZESNIAK is a 83 y.o. male who had mild strokes in 2009 and 2012, none subsequently. His DM seems in control and he has never used tobacco. He is on a statin and Plavix.   DATA  Carotid Duplex (09/14/17): <40% right ICA stenosis and 40-59% left ICA stenosis. No significant stenosis of the bilateral ECA or CCA. Bilateral vertebral arteries are antegrade (normal).  Bilateral subclavian arteries are multiphasic (normal).  No significant change compared to the exams on 07/30/15 and 08/04/16.   09/15/11 MRA of head: 1. Mild atherosclerotic irregularity  of the supraclinoid internal  carotid arteries bilaterally with approximately 50% stenosis on the  left.  2. Mild moderate small vessel disease. No significant proximal  stenosis, aneurysm, or branch vessel occlusion is evident.  MRI of head 09/15/11: 1. No acute intracranial abnormality or significant interval  change.  2. The stable remote infarcts of the left parietal lobe and  cerebellum.  3. Stable periventricular subcortical white matter changes  bilaterally, likely reflecting sequelae of chronic microvascular  Ischemia.   Plan: Follow-up in 1 year with Carotid Duplex scan.   I discussed in depth with the patient the nature of atherosclerosis, and emphasized the importance of maximal medical management including strict control of blood pressure, blood glucose, and lipid levels, obtaining regular exercise, and continued cessation of smoking.  The patient is aware that without maximal medical management the underlying atherosclerotic disease process will progress, limiting the benefit of any interventions. The patient was given information about stroke prevention and what symptoms should prompt the patient to seek immediate medical care. Thank you for allowing [place] to participate in this patient's care.  Clemon Chambers, RN, MSN, FNP-C [facility] [place] Office: [phone]  Clinic Physician: Oneida Alar  09/14/17 10:22 AM

## 2017-09-14 NOTE — Patient Instructions (Addendum)
Stroke Prevention Some health problems and behaviors may make it more likely for you to have a stroke. Below are ways to lessen your risk of having a stroke.  Be active for at least 30 minutes on most or all days.  Do not smoke. Try not to be around others who smoke.  Do not drink too much alcohol. ? Do not have more than 2 drinks a day if you are a man. ? Do not have more than 1 drink a day if you are a woman and are not pregnant.  Eat healthy foods, such as fruits and vegetables. If you were put on a specific diet, follow the diet as told.  Keep your cholesterol levels under control through diet and medicines. Look for foods that are low in saturated fat, trans fat, cholesterol, and are high in fiber.  If you have diabetes, follow all diet plans and take your medicine as told.  Ask your doctor if you need treatment to lower your blood pressure. If you have high blood pressure (hypertension), follow all diet plans and take your medicine as told by your doctor.  If you are 70-71 years old, have your blood pressure checked every 3-5 years. If you are age 80 or older, have your blood pressure checked every year.  Keep a healthy weight. Eat foods that are low in calories, salt, saturated fat, trans fat, and cholesterol.  Do not take drugs.  Avoid birth control pills, if this applies. Talk to your doctor about the risks of taking birth control pills.  Talk to your doctor if you have sleep problems (sleep apnea).  Take all medicine as told by your doctor. ? You may be told to take aspirin or blood thinner medicine. Take this medicine as told by your doctor. ? Understand your medicine instructions.  Make sure any other conditions you have are being taken care of.  Get help right away if:  You suddenly lose feeling (you feel numb) or have weakness in your face, arm, or leg.  Your face or eyelid hangs down to one side.  You suddenly feel confused.  You have trouble talking  (aphasia) or understanding what people are saying.  You suddenly have trouble seeing in one or both eyes.  You suddenly have trouble walking.  You are dizzy.  You lose your balance or your movements are clumsy (uncoordinated).  You suddenly have a very bad headache and you do not know the cause.  You have new chest pain.  Your heart feels like it is fluttering or skipping a beat (irregular heartbeat). Do not wait to see if the symptoms above go away. Get help right away. Call your local emergency services (911 in [place] Do not drive yourself to the hospital. This information is not intended to replace advice given to you by your health care provider. Make sure you discuss any questions you have with your health care provider. Document Released: 03/27/2012 Document Revised: 03/03/2016 Document Reviewed: 03/29/2013 Elsevier Interactive Patient Education  2018 [employer].    To measure for knee high compression hose: Measure the length of calf (from the crease of the knee to the bottom of the heel), largest circumference of calf, and ankle circumference first thing in the morning before your legs have a chance to swell.  Take these 3 measurements with you to obtain 20-30 or 15-20 mm mercury graduated knee high compression hose.  Put the stockings on in the morning, remove at bedtime.

## 2017-10-02 NOTE — Addendum Note (Signed)
Addended by: Lianne Cure A on: 10/02/2017 11:26 AM   Modules accepted: Orders

## 2018-02-20 ENCOUNTER — Ambulatory Visit: Payer: Medicare Other | Attending: Neurology

## 2018-02-20 ENCOUNTER — Other Ambulatory Visit: Payer: Self-pay

## 2018-02-20 DIAGNOSIS — R2681 Unsteadiness on feet: Secondary | ICD-10-CM

## 2018-02-20 DIAGNOSIS — M6281 Muscle weakness (generalized): Secondary | ICD-10-CM | POA: Insufficient documentation

## 2018-02-20 DIAGNOSIS — R2689 Other abnormalities of gait and mobility: Secondary | ICD-10-CM | POA: Diagnosis present

## 2018-02-20 NOTE — Therapy (Signed)
[place] MAIN [facility] SERVICES [address] [place], [place], [address] Phone: [phone]   Fax:  [phone]  Physical Therapy Evaluation  Patient Details  Name: Gregory Padilla MRN: [id number] Date of Birth: 12/25/1924 Referring Provider: Dr. Manuella Ghazi   Encounter Date: 02/20/2018  PT End of Session - 02/20/18 1715    Visit Number  1    Number of Visits  16    Date for PT Re-Evaluation  04/17/18    PT Start Time  1330    PT Stop Time  1424    PT Time Calculation (min)  54 min    Equipment Utilized During Treatment  Gait belt    Activity Tolerance  Patient tolerated treatment well;Treatment limited secondary to medical complications (Comment);Patient limited by fatigue    Behavior During Therapy  [facility] for tasks assessed/performed memory impairement, hearing impairment       Past Medical History:  Diagnosis Date  . Arthritis   . BPH (benign prostatic hyperplasia)   . Carotid artery occlusion   . Coccidioidomycosis   . CVA (cerebral vascular accident) ([place])    left sided weakness  . Degenerative joint disease   . Diabetes mellitus age 76   no insulin  . Hyperlipidemia   . Hypertension   . Meniere disease   . Skin cancer    history of skin cancer  . Stroke [facility])     Past Surgical History:  Procedure Laterality Date  . BACK SURGERY  2004  . CHOLECYSTECTOMY    . PROSTATE SURGERY     x2    There were no vitals filed for this visit.   Subjective Assessment - 02/20/18 1337    Subjective  Patient is a pleasant 84 year old who has been seen in the past for balance/gait at this clinic and presents again for balance/gait    Pertinent History  Patient is a pleasant 84 year old who has been seen in the past for balance/gait at this clinic and presents again for balance/gait. He did not complete his prior therapy. 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 Patient is  hard of hearing and wears hearing aids. Patient reports gradually getting slower and slower. Is now, according to family member supposed to stay in wheelchair in the past 4- 5 months. Is non ambulatory. Independent in dressing but takes a while. A woman comes in the morning helps 4 days/week. Reports doing 10-15 minutes of seated HEP.  Has difficulty getting pants up. Propels w/c in house with feet    Limitations  Standing;Walking;House hold activities;Sitting    How long can you sit comfortably?  n/a     How long can you stand comfortably?  challenging to stand    How long can you walk comfortably?  non ambulatory     Patient Stated Goals  wants to avoid being bedridden. Family member states to help get up and down out of wheelchair and improve strength.     Currently in Pain?  No/denies      L arm 100 flexion R arm WFL flexion   Cant straighten LLE, increased tone   Patient initially starts buckling in bilateral knees upon standing position with excessive trunk flexion and reliance upon UE support.  [facility] PT Assessment - 02/20/18 0001      Assessment   Medical Diagnosis  BLE weakness/ impaired balance    Referring Provider  Dr.  Manuella Ghazi    Onset Date/Surgical Date  -- chair ridden for last 4-5 months. Decline last 2 years.     Hand Dominance  Right    Next MD Visit  -- goes to bladder doctor every 3 weeks.     Prior Therapy  had PT last year, had to stop due to doctor appts.       Precautions   Precautions  Fall supposed to stay seated when alone per son report       Restrictions   Weight Bearing Restrictions  No      Balance Screen   Has the patient fallen in the past 6 months  Yes    How many times?  2-3x/week until w/c bound     Has the patient had a decrease in activity level because of a fear of falling?   Yes    Is the patient reluctant to leave their home because of a fear of falling?   Yes      [place]  Private residence    Living Arrangements   Spouse/significant other    Available Help at Discharge  Family    Type of [place] seat;Grab bars - toilet;Grab bars - tub/shower;Wheelchair - manual;Toilet riser      Prior Function   Level of Independence  Independent with homemaking with wheelchair    Vocation  Retired    Leisure  gets on computer; dozes;       Cognition   Overall Cognitive Status  History of cognitive impairments - at baseline mixed alzheimers and vascular dementia      Observation/Other Assessments   Observations  patient hard of hearing; pleasant man; excessive forward trunk lean    Focus on Therapeutic Outcomes (FOTO)   ABC 2%      Sensation   Light Touch  Appears Intact      Coordination   Gross Motor Movements are Fluid and Coordinated  No    Finger Nose Finger Test  dyskenesia of LUE      Posture/Postural Control   Posture/Postural Control  Postural limitations    Postural Limitations  Forward head;Rounded Shoulders;Flexed trunk      Tone   Assessment Location  Left Lower Extremity      ROM / Strength   AROM / PROM / Strength  AROM;Strength      AROM   Overall AROM   Within functional limits for tasks performed    Overall AROM Comments  BUE WFL: L arm flexion 100 degrees. Decreased hip extension bilaterally.       Strength   Overall Strength  Deficits    Strength Assessment Site  Shoulder;Elbow;Wrist;Hip;Knee;Ankle    Right/Left Shoulder  Right;Left    Right Shoulder Flexion  4/5    Right Shoulder Extension  4-/5    Right Shoulder ABduction  4-/5    Right Shoulder Internal Rotation  4-/5    Right Shoulder External Rotation  4-/5    Left Shoulder Flexion  4-/5    Left Shoulder Extension  3+/5    Left Shoulder ABduction  4-/5    Left Shoulder Internal Rotation  4-/5    Left Shoulder External Rotation  4-/5    Right/Left Elbow  Right;Left    Right Elbow Flexion  4/5    Right Elbow Extension  4/5  Left Elbow Flexion  4-/5    Left Elbow Extension  4-/5    Right/Left Wrist  Right;Left    Right Wrist Flexion  4-/5    Right Wrist Extension  4-/5    Left Wrist Flexion  4-/5    Left Wrist Extension  4-/5    Right/Left Hip  Right;Left    Right Hip Flexion  4-/5    Right Hip Extension  2/5    Right Hip ABduction  3+/5    Right Hip ADduction  3+/5    Left Hip Flexion  3+/5    Left Hip Extension  2/5    Left Hip ABduction  3/5    Left Hip ADduction  3/5    Right/Left Knee  Right;Left    Right Knee Flexion  4-/5    Right Knee Extension  4-/5    Left Knee Flexion  3/5    Left Knee Extension  2+/5    Right/Left Ankle  Right;Left    Right Ankle Dorsiflexion  3/5    Right Ankle Plantar Flexion  3/5    Left Ankle Dorsiflexion  3/5    Left Ankle Plantar Flexion  3/5      Flexibility   Soft Tissue Assessment /Muscle Length  yes    Quadriceps  tight illiopsoas due to seated posture       Transfers   Transfers  Sit to Stand;Stand to Sit;Stand Pivot Transfers;Squat Pivot Transfers    Sit to Stand  4: Min guard;From elevated surface;With upper extremity assist;With armrests    Five time sit to stand comments   72 seconds excessive UE support     Stand to Sit  4: Min guard;With upper extremity assist;With armrests;To elevated surface;Uncontrolled descent    Stand Pivot Transfers  4: Min guard;From elevated surface;With armrests    Squat Pivot Transfers  4: Min guard;From elevated surface;With upper extremity assistance    Comments  Transfers require CGA with patient unable to stand without knee buckling. bilaterally.       Balance   Balance Assessed  Yes      Static Sitting Balance   Static Sitting - Balance Support  Feet supported    Static Sitting - Level of Assistance  5: Stand by assistance    Static Sitting - Comment/# of Minutes  2      Dynamic Sitting Balance   Dynamic Sitting - Balance Support  Feet supported;During functional activity;Right upper extremity supported     Dynamic Sitting - Level of Assistance  5: Stand by assistance    Dynamic Sitting Balance - Compensations  when reach outside BOS patient requires use of UE to stabilize self.     Dynamic Sitting - Balance Activities  Reaching for objects;Reaching across midline    Dynamic Sitting balance - Comments  Reach within and outside of BOS. when reach outside of BOS requires hand to touch surface to stabilize self       Static Standing Balance   Static Standing - Balance Support  Bilateral upper extremity supported    Static Standing - Level of Assistance  4: Min assist    Static Standing Balance -  Activities   [place] - Eyes Opened    Static Standing - Comment/# of Minutes  2.5 minutes      Dynamic Standing Balance   Dynamic Standing - Balance Support  Bilateral upper extremity supported    Dynamic Standing - Level of Assistance  4: Min assist;3: Mod assist  Dynamic Standing - Balance Activities  Head nods    Dynamic Standing - Comments  requires UE support      LLE Tone   LLE Tone  Mild       5x STS=72 second, excessive BUE support.  ABC=2%   when get up at night always falls to left to use urinal.     Treat  Access Code: [email or web address]  URL: [email or web address]  Date: 02/20/2018  Prepared by: Janna Arch   Exercises  Seated Scapular Retraction - 10 reps - 1 sets - 5 hold - 1x daily - 7x weekly  Seated Long Arc Quad - 10 reps - 1 sets - 5 hold - 1x daily - 7x weekly         Objective measurements completed on examination: See above findings.              PT Education - 02/20/18 1715    Education provided  Yes    Education Details  HEP, POC,     Person(s) Educated  Patient;Child(ren)    Methods  Explanation;Demonstration;Handout;Verbal cues;Tactile cues    Comprehension  Verbalized understanding;Returned demonstration;Need further instruction       PT Short Term Goals - 02/20/18 1724      PT SHORT TERM GOAL #1   Title  Patient will be  independent in home exercise program to improve strength/mobility for better functional independence with ADLs.    Baseline  HEP given    Time  2    Period  Weeks    Status  New    Target Date  03/06/18      PT SHORT TERM GOAL #2   Title  Patient will perform SPT with supervision and BUE support to increase safety and mobility.     Baseline  requires CGA and BUE support     Time  2    Period  Weeks    Status  New    Target Date  03/06/18      PT SHORT TERM GOAL #3   Title  Patient will improve UE and LE strength to 4/5 for improved transfer and mobility.     Baseline  Gross 3+/5    Time  2    Period  Weeks    Status  New    Target Date  03/06/18      PT SHORT TERM GOAL #4   Title  Patient will demonstrate ability to stand with single UE support and reach for object  outside of BOS without LOB to increase independence in home.     Baseline  require BUE support    Time  2    Period  Weeks    Status  New    Target Date  03/06/18        PT Long Term Goals - 02/20/18 1727      PT LONG TERM GOAL #1   Title  Patient (> 70 years old) will complete five times sit to stand test in < 15 seconds indicating an increased LE strength and improved balance.    Baseline  72 seconds with excessive UE support     Time  8    Period  Weeks    Status  New    Target Date  04/17/18      PT LONG TERM GOAL #2   Title  Patient will increase BLE and BUE gross strength to 4+/5 as to improve functional strength for independent gait, increased standing tolerance and increased  ADL ability.    Baseline  3/5 gross    Time  8    Period  Weeks    Status  New    Target Date  04/17/18      PT LONG TERM GOAL #3   Title  Patient will perform SPT independently from "average" chair height to improve mobility in and outside of home.     Baseline  requires CGA from raised surface with excessive UE support     Time  8    Period  Weeks    Status  New    Target Date  04/17/18      PT LONG TERM GOAL #4    Title  Patient will perform STS with SUE support to demonstrate improved LE strength and transfers.     Baseline  require BUE support     Time  8    Period  Weeks    Status  New    Target Date  04/17/18      PT LONG TERM GOAL #5   Title  Patient will stand without UE support for 2 minutes to increase independence with ADLs.     Baseline  require BUE support     Time  8    Period  Weeks    Status  New    Target Date  04/17/18             Plan - 02/20/18 1717    Clinical Impression Statement  Patient is a pleasant 84 year old man who presents with difficulty transferring, standing, and with concurrent LE weakness. Pt. Has received therapy for this in the past but 4-5 months ago per patient/patient's son report patient is now wheelchair bound. He is able to stand with UE support and perform standing balance as well as transfer. Patient and family wants to improve transfer ability, increase strength, and prevent patient from becoming "bed bound". Patient presents with BLE weakness with L>R due to hx of stroke. Patient has concurrent mixed Alzheimers and vascular dementia.  5x STS= 72 seconds with excessive UE support. Patient requires UE support for all transfers and is unable to initially stand tall with buckling of BLE. Patient will benefit from skilled physical therapy to improve strength, transfers, and balance to decrease fall risk and improve quality of life.     History and Personal Factors relevant to plan of care:  PMH includes:dementia and alzheimers, Type 2 DM, GERD, IBS, Meniere's disease and stroke (2011, 2005).     Clinical Presentation  Unstable    Clinical Presentation due to:  concurrent alzheimers and dementia    Clinical Decision Making  High    Rehab Potential  Fair    Clinical Impairments Affecting Rehab Potential  (+) good family support, motivated (-) dementia/memory/alzheimers. age    PT Frequency  2x / week    PT Duration  8 weeks    PT Treatment/Interventions   ADLs/Self Care Home Management;Biofeedback;Electrical Stimulation;Functional mobility training;Gait training;DME Instruction;Therapeutic activities;Therapeutic exercise;Balance training;Neuromuscular re-education;Manual techniques;Wheelchair mobility training;Patient/family education;Passive range of motion;Dry needling;Vestibular;Taping;Splinting;Energy conservation    PT Next Visit Plan  stand in // bars. LE strength, transfer practice.     PT Home Exercise Plan  see sheet    Consulted and Agree with Plan of Care  Patient;Family member/caregiver    Family Member Consulted  son       Patient will benefit from skilled therapeutic intervention in order to improve the following deficits and impairments:  Abnormal gait, Decreased  activity tolerance, Decreased balance, Decreased knowledge of precautions, Decreased endurance, Decreased coordination, Decreased cognition, Decreased knowledge of use of DME, Decreased mobility, Decreased range of motion, Decreased safety awareness, Difficulty walking, Decreased strength, Hypomobility, Impaired flexibility, Impaired perceived functional ability, Impaired tone, Postural dysfunction, Improper body mechanics  Visit Diagnosis: Unsteadiness  Muscle weakness (generalized)  Other abnormalities of gait and mobility     Problem List Patient Active Problem List   Diagnosis Date Noted  . CAP (community acquired pneumonia) 01/27/2015  . Sepsis ([place]) 01/27/2015  . Community acquired bacterial pneumonia 01/27/2015  . Stroke ([place])   . Arthritis   . Carotid artery occlusion   . CVA (cerebral vascular accident) ([place])   . Hyperlipidemia   . Hypertension   . BPH (benign prostatic hyperplasia)   . Skin cancer   . Weakness   . Pain in limb-Right trunk 07/18/2013  . Occlusion and stenosis of carotid artery without mention of cerebral infarction 07/18/2013  . TIA (transient ischemic attack) 09/15/2011  . Diabetes mellitus without complication ([place]) 39/76/7341   . PERSONAL HISTORY OF COLONIC POLYPS 10/28/2009   Janna Arch, PT, DPT   02/20/2018, 5:32 PM  [place] MAIN [facility] SERVICES [address] [place], [place], [address] Phone: [phone]   Fax:  [phone]  Name: ROLLA KEDZIERSKI MRN: [id number] Date of Birth: 06/07/1925

## 2018-02-20 NOTE — Patient Instructions (Signed)
Access Code: [email or web address]  URL: [email or web address]  Date: 02/20/2018  Prepared by: Janna Arch   Exercises  Seated Scapular Retraction - 10 reps - 1 sets - 5 hold - 1x daily - 7x weekly  Seated Long Arc Quad - 10 reps - 1 sets - 5 hold - 1x daily - 7x weekly

## 2018-02-21 ENCOUNTER — Ambulatory Visit: Payer: Medicare Other | Admitting: Physical Therapy

## 2018-02-27 ENCOUNTER — Encounter: Payer: Medicare Other | Admitting: Physical Therapy

## 2018-02-27 ENCOUNTER — Ambulatory Visit: Payer: Medicare Other | Admitting: Physical Therapy

## 2018-03-02 ENCOUNTER — Ambulatory Visit: Payer: Medicare Other | Admitting: Physical Therapy

## 2018-03-07 ENCOUNTER — Ambulatory Visit: Payer: Medicare Other | Admitting: Physical Therapy

## 2018-03-12 ENCOUNTER — Ambulatory Visit: Payer: Medicare Other | Admitting: Physical Therapy

## 2018-03-14 ENCOUNTER — Encounter: Payer: Self-pay | Admitting: Physical Therapy

## 2018-03-14 ENCOUNTER — Ambulatory Visit: Payer: Medicare Other | Attending: Neurology | Admitting: Physical Therapy

## 2018-03-14 DIAGNOSIS — R2681 Unsteadiness on feet: Secondary | ICD-10-CM | POA: Diagnosis not present

## 2018-03-14 DIAGNOSIS — M6281 Muscle weakness (generalized): Secondary | ICD-10-CM | POA: Diagnosis present

## 2018-03-14 DIAGNOSIS — R2689 Other abnormalities of gait and mobility: Secondary | ICD-10-CM | POA: Insufficient documentation

## 2018-03-14 DIAGNOSIS — R269 Unspecified abnormalities of gait and mobility: Secondary | ICD-10-CM | POA: Diagnosis present

## 2018-03-14 NOTE — Patient Instructions (Addendum)
http://gt2.exer.us/225   Copyright  VHI. All rights reserved.   Lower Trunk Rotation Stretch  Lying on back with knees bent, Keeping back flat and feet together, rotate knees side to side slowly and in pain free range of motion.  Hold _2___ seconds. Repeat for 1-2 minutes. Do __1__ sets per session. Do __2-3__ sessions per day.  http://orth.exer.us/122   Copyright  VHI. All rights reserved.   Bridge   Lie back, legs bent. Inhale, pressing hips up.  Exhale, rolling down along spine from top. Repeat _5___ times then stretch (next exercise). Repeat 3 times. . Do __1__ sessions per day.  Copyright  VHI. All rights reserved.   Copyright  VHI. All rights reserved. Knee to Chest (Flexion)   Pull knee toward chest. Feel stretch in lower back or buttock area. Breathing deeply, Hold __15__ seconds. Repeat with other knee. Repeat _2-3___ times. Do _2-3___ sessions per day.    Hip Flexion / Knee Extension: Straight-Leg Raise (Eccentric)    Lie on back. Lift leg with knee straight. Slowly lower leg for 3-5 seconds. __10_ reps per set, 2___ sets per day, _5__ days per week. Lower like elevator, stopping at each floor. Copyright  VHI. All rights reserved.  Abduction: Clam (Eccentric) - Side-Lying    Lie on side with knees bent. Lift top knee, keeping feet together. Keep trunk steady. Slowly lower for 3-5 seconds. __10_ reps per set, __2_ sets per day, __5_ days per week.  http://ecce.exer.us/64   Copyright  VHI. All rights reserved.

## 2018-03-14 NOTE — Therapy (Signed)
[place] MAIN [facility] SERVICES [address] [place], [place], [address] Phone: [phone]   Fax:  [phone]  Physical Therapy Treatment  Patient Details  Name: Gregory Padilla MRN: [id number] Date of Birth: 19-Mar-1925 Referring Provider: Dr. Manuella Ghazi   Encounter Date: 03/14/2018  PT End of Session - 03/14/18 1335    Visit Number  2    Number of Visits  16    Date for PT Re-Evaluation  04/17/18    Authorization Type  progress note 2/10    PT Start Time  1332    PT Stop Time  1415    PT Time Calculation (min)  43 min    Equipment Utilized During Treatment  Gait belt    Activity Tolerance  Patient tolerated treatment well;Treatment limited secondary to medical complications (Comment);Patient limited by fatigue    Behavior During Therapy  [facility] for tasks assessed/performed memory impairement, hearing impairment       Past Medical History:  Diagnosis Date  . Arthritis   . BPH (benign prostatic hyperplasia)   . Carotid artery occlusion   . Coccidioidomycosis   . CVA (cerebral vascular accident) ([place])    left sided weakness  . Degenerative joint disease   . Diabetes mellitus age 84   no insulin  . Hyperlipidemia   . Hypertension   . Meniere disease   . Skin cancer    history of skin cancer  . Stroke [facility])     Past Surgical History:  Procedure Laterality Date  . BACK SURGERY  2004  . CHOLECYSTECTOMY    . PROSTATE SURGERY     x2    There were no vitals filed for this visit.  Subjective Assessment - 03/14/18 1334    Subjective  Patient reports doing okay; He reports being mostly wheelchair bound and not doing any walking; Reports adherence to HEP but states,"I still feel weak. I can't tell a difference after doing them." Pt denies any recent falls;     Pertinent History  Patient is a pleasant 84 year old who has been seen in the past for balance/gait at this clinic and presents again for balance/gait. He did not complete his prior  therapy. 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 Patient is hard of hearing and wears hearing aids. Patient reports gradually getting slower and slower. Is now, according to family member supposed to stay in wheelchair in the past 4- 5 months. Is non ambulatory. Independent in dressing but takes a while. A woman comes in the morning helps 4 days/week. Reports doing 10-15 minutes of seated HEP.  Has difficulty getting pants up. Propels w/c in house with feet    Limitations  Standing;Walking;House hold activities;Sitting    How long can you sit comfortably?  n/a     How long can you stand comfortably?  challenging to stand    How long can you walk comfortably?  non ambulatory     Patient Stated Goals  wants to avoid being bedridden. Family member states to help get up and down out of wheelchair and improve strength.     Currently in Pain?  No/denies    Multiple Pain Sites  No        TREATMENT: Warm up on Nustep BUE/BLE level 2 x4 min (unbilled);  Pt re-educated in HEP; He is currently using green tband with sitting exercise.    Patient requires min A for  stand pivot transfer wheelchair<>mat table; He is supervision for sit to supine.  Supine: Passive hamstring stretch to tolerable range of motion 20 sec hold x2 reps bilaterally; SAQ with large bolster 2# 3 sec hold, x12 bilaterally with min VCs to slow down and increase hold time for better strengthening;  SLR hip flexion AROM x10 reps bilaterally with min Vcs to keep knee straight for better quad strengthening;  Lumbar trunk rotation x10 reps each direction; BLE bridges with arms by side, x5 reps, x3 sets Single knee to chest stretch 15 sec hold x1 rep, x2 sets each; Patient required min-moderate verbal/tactile cues for correct exercise technique including cues for correct position and to increase hold time for better strengthening and motor  control;  Sidelying: Clamshells AROM 2x10 bilaterally with mod VCs to avoid posterior rotation for better hip abduction activation and strengthening;  Provided written HEP for better adherence;   Sit<>Stand transfer with RW x2 reps; Standing with RW, march in place x5 reps bilaterally x2 sets; Patient required min VCs to improve erect posture. With 2nd set of exercise, able to exhibit better ROM and flexibility. Patient ambulated 10 feet with RW, min A with cues for erect posture. Exhibits forward flexed posture with narrow base of support and decreased step length;                   PT Education - 03/14/18 1335    Education provided  Yes    Education Details  HEP reinforced, strengthening;     Person(s) Educated  Patient    Methods  Explanation;Demonstration;Verbal cues    Comprehension  Verbalized understanding;Returned demonstration;Verbal cues required;Need further instruction       PT Short Term Goals - 02/20/18 1724      PT SHORT TERM GOAL #1   Title  Patient will be independent in home exercise program to improve strength/mobility for better functional independence with ADLs.    Baseline  HEP given    Time  2    Period  Weeks    Status  New    Target Date  03/06/18      PT SHORT TERM GOAL #2   Title  Patient will perform SPT with supervision and BUE support to increase safety and mobility.     Baseline  requires CGA and BUE support     Time  2    Period  Weeks    Status  New    Target Date  03/06/18      PT SHORT TERM GOAL #3   Title  Patient will improve UE and LE strength to 4/5 for improved transfer and mobility.     Baseline  Gross 3+/5    Time  2    Period  Weeks    Status  New    Target Date  03/06/18      PT SHORT TERM GOAL #4   Title  Patient will demonstrate ability to stand with single UE support and reach for object  outside of BOS without LOB to increase independence in home.     Baseline  require BUE support    Time  2    Period   Weeks    Status  New    Target Date  03/06/18        PT Long Term Goals - 02/20/18 1727      PT LONG TERM GOAL #1   Title  Patient (> 60 years old) will complete five times sit to  stand test in < 15 seconds indicating an increased LE strength and improved balance.    Baseline  72 seconds with excessive UE support     Time  8    Period  Weeks    Status  New    Target Date  04/17/18      PT LONG TERM GOAL #2   Title  Patient will increase BLE and BUE gross strength to 4+/5 as to improve functional strength for independent gait, increased standing tolerance and increased ADL ability.    Baseline  3/5 gross    Time  8    Period  Weeks    Status  New    Target Date  04/17/18      PT LONG TERM GOAL #3   Title  Patient will perform SPT independently from "average" chair height to improve mobility in and outside of home.     Baseline  requires CGA from raised surface with excessive UE support     Time  8    Period  Weeks    Status  New    Target Date  04/17/18      PT LONG TERM GOAL #4   Title  Patient will perform STS with SUE support to demonstrate improved LE strength and transfers.     Baseline  require BUE support     Time  8    Period  Weeks    Status  New    Target Date  04/17/18      PT LONG TERM GOAL #5   Title  Patient will stand without UE support for 2 minutes to increase independence with ADLs.     Baseline  require BUE support     Time  8    Period  Weeks    Status  New    Target Date  04/17/18            Plan - 03/14/18 1422    Clinical Impression Statement  Patient instructed in advanced LE strengthening. he exhibits increased tightness in LE hip flexors and hamstrings due to chronic seated position at home. Patient does require multiple cues for correct positioning and exercise technique. Instructed patient to slow down LE movement for better motor control and strengthening. Advanced HEP;  Patient would benefit from additional skilled PT intervention  to improve strength, balance and gait safety;    Rehab Potential  Fair    Clinical Impairments Affecting Rehab Potential  (+) good family support, motivated (-) dementia/memory/alzheimers. age    PT Frequency  2x / week    PT Duration  8 weeks    PT Treatment/Interventions  ADLs/Self Care Home Management;Biofeedback;Electrical Stimulation;Functional mobility training;Gait training;DME Instruction;Therapeutic activities;Therapeutic exercise;Balance training;Neuromuscular re-education;Manual techniques;Wheelchair mobility training;Patient/family education;Passive range of motion;Dry needling;Vestibular;Taping;Splinting;Energy conservation    PT Next Visit Plan  stand in // bars. LE strength, transfer practice.     PT Home Exercise Plan  see sheet    Consulted and Agree with Plan of Care  Patient;Family member/caregiver    Family Member Consulted  son       Patient will benefit from skilled therapeutic intervention in order to improve the following deficits and impairments:  Abnormal gait, Decreased activity tolerance, Decreased balance, Decreased knowledge of precautions, Decreased endurance, Decreased coordination, Decreased cognition, Decreased knowledge of use of DME, Decreased mobility, Decreased range of motion, Decreased safety awareness, Difficulty walking, Decreased strength, Hypomobility, Impaired flexibility, Impaired perceived functional ability, Impaired tone, Postural dysfunction, Improper body mechanics  Visit  Diagnosis: Unsteadiness  Muscle weakness (generalized)  Other abnormalities of gait and mobility     Problem List Patient Active Problem List   Diagnosis Date Noted  . CAP (community acquired pneumonia) 01/27/2015  . Sepsis ([place]) 01/27/2015  . Community acquired bacterial pneumonia 01/27/2015  . Stroke ([place])   . Arthritis   . Carotid artery occlusion   . CVA (cerebral vascular accident) ([place])   . Hyperlipidemia   . Hypertension   . BPH (benign prostatic  hyperplasia)   . Skin cancer   . Weakness   . Pain in limb-Right trunk 07/18/2013  . Occlusion and stenosis of carotid artery without mention of cerebral infarction 07/18/2013  . TIA (transient ischemic attack) 09/15/2011  . Diabetes mellitus without complication ([place]) 83/66/2947  . PERSONAL HISTORY OF COLONIC POLYPS 10/28/2009    Wrigley Plasencia PT, DPT 03/14/2018, 2:23 PM  [place] MAIN [facility] SERVICES [address] [place], [place], [address] Phone: [phone]   Fax:  [phone]  Name: Gregory Padilla MRN: [id number] Date of Birth: [birth year]

## 2018-03-19 ENCOUNTER — Ambulatory Visit: Payer: Medicare Other | Admitting: Physical Therapy

## 2018-03-21 ENCOUNTER — Encounter: Payer: Self-pay | Admitting: Physical Therapy

## 2018-03-21 ENCOUNTER — Ambulatory Visit: Payer: Medicare Other | Admitting: Physical Therapy

## 2018-03-21 DIAGNOSIS — M6281 Muscle weakness (generalized): Secondary | ICD-10-CM

## 2018-03-21 DIAGNOSIS — R2689 Other abnormalities of gait and mobility: Secondary | ICD-10-CM

## 2018-03-21 DIAGNOSIS — R2681 Unsteadiness on feet: Secondary | ICD-10-CM | POA: Diagnosis not present

## 2018-03-21 NOTE — Therapy (Signed)
[place] MAIN [facility] SERVICES [address] [place], [place], [address] Phone: [phone]   Fax:  [phone]  Physical Therapy Treatment  Patient Details  Name: Gregory Padilla MRN: [id number] Date of Birth: 06/13/1925 Referring Provider: Dr. Manuella Ghazi   Encounter Date: 03/21/2018  PT End of Session - 03/21/18 1357    Visit Number  3    Number of Visits  16    Date for PT Re-Evaluation  04/17/18    Authorization Type  progress note 3/10    PT Start Time  1345    PT Stop Time  1430    PT Time Calculation (min)  45 min    Equipment Utilized During Treatment  Gait belt    Activity Tolerance  Patient tolerated treatment well;Treatment limited secondary to medical complications (Comment);Patient limited by fatigue    Behavior During Therapy  [facility] for tasks assessed/performed memory impairement, hearing impairment       Past Medical History:  Diagnosis Date  . Arthritis   . BPH (benign prostatic hyperplasia)   . Carotid artery occlusion   . Coccidioidomycosis   . CVA (cerebral vascular accident) ([place])    left sided weakness  . Degenerative joint disease   . Diabetes mellitus age 50   no insulin  . Hyperlipidemia   . Hypertension   . Meniere disease   . Skin cancer    history of skin cancer  . Stroke [facility])     Past Surgical History:  Procedure Laterality Date  . BACK SURGERY  2004  . CHOLECYSTECTOMY    . PROSTATE SURGERY     x2    There were no vitals filed for this visit.  Subjective Assessment - 03/21/18 1356    Subjective  Patient reports increased soreness early this morning (at 6AM in small of low back). He states that the pain went away around 8AM. He denies any pain currently. He reports, "I think that I overdid the exercise last night."     Pertinent History  Patient is a pleasant 84 year old who has been seen in the past for balance/gait at this clinic and presents again for balance/gait. He did not complete his prior  therapy. 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 Patient is hard of hearing and wears hearing aids. Patient reports gradually getting slower and slower. Is now, according to family member supposed to stay in wheelchair in the past 4- 5 months. Is non ambulatory. Independent in dressing but takes a while. A woman comes in the morning helps 4 days/week. Reports doing 10-15 minutes of seated HEP.  Has difficulty getting pants up. Propels w/c in house with feet    Limitations  Standing;Walking;House hold activities;Sitting    How long can you sit comfortably?  n/a     How long can you stand comfortably?  challenging to stand    How long can you walk comfortably?  non ambulatory     Patient Stated Goals  wants to avoid being bedridden. Family member states to help get up and down out of wheelchair and improve strength.     Currently in Pain?  No/denies    Multiple Pain Sites  No           TREATMENT: Warm up on Nustep BUE/BLE level 2 x4 min (unbilled);  Patient ambulated with RW x30 feet with min-mod A with wheelchair follow (+2 assist); Patient exhibits  narrow base of support, reciprocal pattern with increased foot drag and slouched posture with increased hip/knee flexion in stance;  Supine: Passive hamstring stretch to tolerable range of motion 20 sec hold x2 reps bilaterally; Passive knee to chest stretch 20 sec hold x2 reps bilaterally; Lumbar trunk rotation x2 min with cues to avoid painful ROM to help reduce tightness in lumbar spine;  SAQ with large bolster 2# 3 sec hold, x15 bilaterally with min VCs to slow down and increase hold time for better strengthening;  SLR hip flexion AROM 2#x10 reps bilaterally with min Vcs to keep knee straight for better quad strengthening;  Heel slides with 2# ankle weight x15 reps bilaterally; patient able to exhibit better ROM tolerance exhibiting less stiffness in knee and less  discomfort during exercise;  BLE bridges with arms by side, x5 reps, x1 sets with mod VCs to avoid painful ROM; Patient able to contract partial ROM with minimal difficulty;  Patient required min-moderate verbal/tactile cues for correct exercise technique including cues for correct position and to increase hold time for better strengthening and motor control;  Sidelying: Hip Abduction SLR 2#  2x10 bilaterally with mod VCs to avoid posterior rotation for better hip abduction activation and strengthening;   Patient required min A for rolling and sidelying to sitting; Required mod A for stand pivot transfer mat<>wheelchair;                     PT Education - 03/21/18 1356    Education provided  Yes    Education Details  HEP reinforced, strengthening,     Person(s) Educated  Patient    Methods  Explanation;Demonstration;Verbal cues    Comprehension  Verbalized understanding;Returned demonstration;Verbal cues required;Need further instruction       PT Short Term Goals - 02/20/18 1724      PT SHORT TERM GOAL #1   Title  Patient will be independent in home exercise program to improve strength/mobility for better functional independence with ADLs.    Baseline  HEP given    Time  2    Period  Weeks    Status  New    Target Date  03/06/18      PT SHORT TERM GOAL #2   Title  Patient will perform SPT with supervision and BUE support to increase safety and mobility.     Baseline  requires CGA and BUE support     Time  2    Period  Weeks    Status  New    Target Date  03/06/18      PT SHORT TERM GOAL #3   Title  Patient will improve UE and LE strength to 4/5 for improved transfer and mobility.     Baseline  Gross 3+/5    Time  2    Period  Weeks    Status  New    Target Date  03/06/18      PT SHORT TERM GOAL #4   Title  Patient will demonstrate ability to stand with single UE support and reach for object  outside of BOS without LOB to increase independence in home.      Baseline  require BUE support    Time  2    Period  Weeks    Status  New    Target Date  03/06/18        PT Long Term Goals - 02/20/18 1727      PT LONG TERM GOAL #1  Title  Patient (> 3 years old) will complete five times sit to stand test in < 15 seconds indicating an increased LE strength and improved balance.    Baseline  72 seconds with excessive UE support     Time  8    Period  Weeks    Status  New    Target Date  04/17/18      PT LONG TERM GOAL #2   Title  Patient will increase BLE and BUE gross strength to 4+/5 as to improve functional strength for independent gait, increased standing tolerance and increased ADL ability.    Baseline  3/5 gross    Time  8    Period  Weeks    Status  New    Target Date  04/17/18      PT LONG TERM GOAL #3   Title  Patient will perform SPT independently from "average" chair height to improve mobility in and outside of home.     Baseline  requires CGA from raised surface with excessive UE support     Time  8    Period  Weeks    Status  New    Target Date  04/17/18      PT LONG TERM GOAL #4   Title  Patient will perform STS with SUE support to demonstrate improved LE strength and transfers.     Baseline  require BUE support     Time  8    Period  Weeks    Status  New    Target Date  04/17/18      PT LONG TERM GOAL #5   Title  Patient will stand without UE support for 2 minutes to increase independence with ADLs.     Baseline  require BUE support     Time  8    Period  Weeks    Status  New    Target Date  04/17/18            Plan - 03/21/18 1414    Clinical Impression Statement  Patient instructed in advanced LE strengthening exercise. He required min VCs for correct exercise technique including to increase hold time for better LE strengthening. patient very slow with exercise requiring multiple rest breaks due to fatigue. He exhibits increased stiffness in hip/knee being unable to fully extend legs in supine and  unable to stand fully erect; He ambulates with mod A with RW with chair follow exhibiting short shuffled steps with narrow base of support. He exhibits slight buckling in each LE during stance at times.  Patient would benefit from additional skilled PT intervention to improve strength, balance and gait safety;    Rehab Potential  Fair    Clinical Impairments Affecting Rehab Potential  (+) good family support, motivated (-) dementia/memory/alzheimers. age    PT Frequency  2x / week    PT Duration  8 weeks    PT Treatment/Interventions  ADLs/Self Care Home Management;Biofeedback;Electrical Stimulation;Functional mobility training;Gait training;DME Instruction;Therapeutic activities;Therapeutic exercise;Balance training;Neuromuscular re-education;Manual techniques;Wheelchair mobility training;Patient/family education;Passive range of motion;Dry needling;Vestibular;Taping;Splinting;Energy conservation    PT Next Visit Plan  stand in // bars. LE strength, transfer practice.     PT Home Exercise Plan  see sheet    Consulted and Agree with Plan of Care  Patient;Family member/caregiver    Family Member Consulted  son       Patient will benefit from skilled therapeutic intervention in order to improve the following deficits and impairments:  Abnormal gait,  Decreased activity tolerance, Decreased balance, Decreased knowledge of precautions, Decreased endurance, Decreased coordination, Decreased cognition, Decreased knowledge of use of DME, Decreased mobility, Decreased range of motion, Decreased safety awareness, Difficulty walking, Decreased strength, Hypomobility, Impaired flexibility, Impaired perceived functional ability, Impaired tone, Postural dysfunction, Improper body mechanics  Visit Diagnosis: Unsteadiness  Muscle weakness (generalized)  Other abnormalities of gait and mobility     Problem List Patient Active Problem List   Diagnosis Date Noted  . CAP (community acquired pneumonia)  01/27/2015  . Sepsis ([place]) 01/27/2015  . Community acquired bacterial pneumonia 01/27/2015  . Stroke ([place])   . Arthritis   . Carotid artery occlusion   . CVA (cerebral vascular accident) ([place])   . Hyperlipidemia   . Hypertension   . BPH (benign prostatic hyperplasia)   . Skin cancer   . Weakness   . Pain in limb-Right trunk 07/18/2013  . Occlusion and stenosis of carotid artery without mention of cerebral infarction 07/18/2013  . TIA (transient ischemic attack) 09/15/2011  . Diabetes mellitus without complication ([place]) 32/67/1245  . PERSONAL HISTORY OF COLONIC POLYPS 10/28/2009    Jyrah Blye  PT, DPT 03/21/2018, 3:43 PM  [place] MAIN [facility] SERVICES [address] [place], [place], [address] Phone: [phone]   Fax:  [phone]  Name: Gregory Padilla MRN: [id number] Date of Birth: 1924-11-08

## 2018-03-26 ENCOUNTER — Ambulatory Visit: Payer: Medicare Other | Admitting: Physical Therapy

## 2018-03-26 ENCOUNTER — Encounter: Payer: Self-pay | Admitting: Physical Therapy

## 2018-03-26 DIAGNOSIS — R2681 Unsteadiness on feet: Secondary | ICD-10-CM | POA: Diagnosis not present

## 2018-03-26 DIAGNOSIS — M6281 Muscle weakness (generalized): Secondary | ICD-10-CM

## 2018-03-26 DIAGNOSIS — R2689 Other abnormalities of gait and mobility: Secondary | ICD-10-CM

## 2018-03-26 NOTE — Therapy (Signed)
[place] MAIN [facility] SERVICES [address] [place], [place], [address] Phone: [phone]   Fax:  [phone]  Physical Therapy Treatment  Patient Details  Name: Gregory Padilla MRN: [id number] Date of Birth: Apr 27, 1925 Referring Provider: Dr. Manuella Ghazi   Encounter Date: 03/26/2018  PT End of Session - 03/26/18 1350    Visit Number  4    Number of Visits  16    Date for PT Re-Evaluation  04/17/18    Authorization Type  progress note 4/10    PT Start Time  1340    PT Stop Time  1430    PT Time Calculation (min)  50 min    Equipment Utilized During Treatment  Gait belt    Activity Tolerance  Patient tolerated treatment well;Treatment limited secondary to medical complications (Comment);Patient limited by fatigue    Behavior During Therapy  [facility] for tasks assessed/performed memory impairement, hearing impairment       Past Medical History:  Diagnosis Date  . Arthritis   . BPH (benign prostatic hyperplasia)   . Carotid artery occlusion   . Coccidioidomycosis   . CVA (cerebral vascular accident) ([place])    left sided weakness  . Degenerative joint disease   . Diabetes mellitus age 89   no insulin  . Hyperlipidemia   . Hypertension   . Meniere disease   . Skin cancer    history of skin cancer  . Stroke [facility])     Past Surgical History:  Procedure Laterality Date  . BACK SURGERY  2004  . CHOLECYSTECTOMY    . PROSTATE SURGERY     x2    There were no vitals filed for this visit.  Subjective Assessment - 03/26/18 1349    Subjective  Patient reports doing a little better today. He reports still feeling well but states that his back hasn't bothered him as much as before. He reports adherence to HEP;     Pertinent History  Patient is a pleasant 84 year old who has been seen in the past for balance/gait at this clinic and presents again for balance/gait. He did not complete his prior therapy. Patient does have mixed alzheimer's and  vascular dementia as well as takes care of his wife who has dementia. Patient is hard of hearing and wears hearing aids. Patient reports gradually getting slower and slower. Is now, according to family member supposed to stay in wheelchair in the past 4- 5 months. Is non ambulatory. Independent in dressing but takes a while. A woman comes in the morning helps 4 days/week. Reports doing 10-15 minutes of seated HEP.  Has difficulty getting pants up. Propels w/c in house with feet    Limitations  Standing;Walking;House hold activities;Sitting    How long can you sit comfortably?  n/a     How long can you stand comfortably?  challenging to stand    How long can you walk comfortably?  non ambulatory     Patient Stated Goals  wants to avoid being bedridden. Family member states to help get up and down out of wheelchair and improve strength.     Currently in Pain?  No/denies    Multiple Pain Sites  No         TREATMENT: Warm up on Nustep BUE/BLE level 2 x5 min (unbilled);  Patient ambulated with RW x30 feet x3 reps with min-mod A with wheelchair follow (+2 assist); Patient exhibits narrow base of support, reciprocal pattern with increased foot drag  and slouched posture with increased hip/knee flexion in stance; Patient required cues to increase step length and improve erect posture; He was able to progress to min A with +1 exhibiting improved gait mechanics with better step control.   Supine: Passive hamstring stretch to tolerable range of motion contract relax 5 sec hold, 10 sec rest, x3 reps each x2 sets; Passive knee to chest stretch 20 sec hold x2 reps bilaterally; Lumbar trunk rotation x2 min with cues to avoid painful ROM to help reduce tightness in lumbar spine;  Patient continues to have difficulty straightening legs after hamstring stretch, PT performed soft tissue massage to bilateral hamstring in supine x3 min each LE to facilitate better tissue mobility and reduce tightness;  Quad set 3  sec hold x15 reps bilaterally x2 sets with min VCs for positioning for better quad activation and improved knee ROM.   SAQ with large bolster 2# 3 sec hold, x15 bilaterally with min VCs to slow down and increase hold time for better strengthening; SLR hip flexion AROM 2#x15 reps bilaterally with min Vcs to keep knee straight for better quad strengthening;  Hip abduction/adduction to midline SLR 2# x10 bilaterally with cues to keep foot oriented towards ceiling for better hip strengthening;   Patient required min-moderate verbal/tactile cues for correct exercise techniqueincluding cues for correct position and to increase hold time for better strengthening and motor control;  Sidelying: Hip Abduction clamshells orange tband 2x15 with min VCs to avoid posterior roll for  Better hip strengthening;   Finished with gait around gym x60 feet with RW, min A for safety with reciprocal gait pattern, requiring min Vcs to keep feet apart and to increase erect posture for better gait safety;   Sit<>Stand from regular chair x5 reps with 2 HHA; Cues to increase forward lean and to improve hand placement for safety;                           PT Education - 03/26/18 1349    Education provided  Yes    Education Details  HEP reinforced, strengthening;     Person(s) Educated  Patient    Methods  Explanation;Demonstration;Verbal cues    Comprehension  Verbalized understanding;Returned demonstration;Verbal cues required;Need further instruction       PT Short Term Goals - 02/20/18 1724      PT SHORT TERM GOAL #1   Title  Patient will be independent in home exercise program to improve strength/mobility for better functional independence with ADLs.    Baseline  HEP given    Time  2    Period  Weeks    Status  New    Target Date  03/06/18      PT SHORT TERM GOAL #2   Title  Patient will perform SPT with supervision and BUE support to increase safety and mobility.     Baseline   requires CGA and BUE support     Time  2    Period  Weeks    Status  New    Target Date  03/06/18      PT SHORT TERM GOAL #3   Title  Patient will improve UE and LE strength to 4/5 for improved transfer and mobility.     Baseline  Gross 3+/5    Time  2    Period  Weeks    Status  New    Target Date  03/06/18      PT  SHORT TERM GOAL #4   Title  Patient will demonstrate ability to stand with single UE support and reach for object  outside of BOS without LOB to increase independence in home.     Baseline  require BUE support    Time  2    Period  Weeks    Status  New    Target Date  03/06/18        PT Long Term Goals - 02/20/18 1727      PT LONG TERM GOAL #1   Title  Patient (> 60 years old) will complete five times sit to stand test in < 15 seconds indicating an increased LE strength and improved balance.    Baseline  72 seconds with excessive UE support     Time  8    Period  Weeks    Status  New    Target Date  04/17/18      PT LONG TERM GOAL #2   Title  Patient will increase BLE and BUE gross strength to 4+/5 as to improve functional strength for independent gait, increased standing tolerance and increased ADL ability.    Baseline  3/5 gross    Time  8    Period  Weeks    Status  New    Target Date  04/17/18      PT LONG TERM GOAL #3   Title  Patient will perform SPT independently from "average" chair height to improve mobility in and outside of home.     Baseline  requires CGA from raised surface with excessive UE support     Time  8    Period  Weeks    Status  New    Target Date  04/17/18      PT LONG TERM GOAL #4   Title  Patient will perform STS with SUE support to demonstrate improved LE strength and transfers.     Baseline  require BUE support     Time  8    Period  Weeks    Status  New    Target Date  04/17/18      PT LONG TERM GOAL #5   Title  Patient will stand without UE support for 2 minutes to increase independence with ADLs.     Baseline   require BUE support     Time  8    Period  Weeks    Status  New    Target Date  04/17/18            Plan - 03/26/18 1416    Clinical Impression Statement  Patient instructed in advanced LE strengthening. Instructed patient in advanced knee strengthening exercise including quad sets to facilitate better knee control in stance. Patient able to progress gait with improved step length and motor control. He went from Mod A to min A with RW on level surface. He continues to have narrow base of support due to hip abductor weakness.  Patient would benefit from additional skilled PT intervention to improve strength, balance and gait safety;    Rehab Potential  Fair    Clinical Impairments Affecting Rehab Potential  (+) good family support, motivated (-) dementia/memory/alzheimers. age    PT Frequency  2x / week    PT Duration  8 weeks    PT Treatment/Interventions  ADLs/Self Care Home Management;Biofeedback;Electrical Stimulation;Functional mobility training;Gait training;DME Instruction;Therapeutic activities;Therapeutic exercise;Balance training;Neuromuscular re-education;Manual techniques;Wheelchair mobility training;Patient/family education;Passive range of motion;Dry needling;Vestibular;Taping;Splinting;Energy conservation    PT Next  Visit Plan  stand in // bars. LE strength, transfer practice.     PT Home Exercise Plan  see sheet    Consulted and Agree with Plan of Care  Patient;Family member/caregiver    Family Member Consulted  son       Patient will benefit from skilled therapeutic intervention in order to improve the following deficits and impairments:  Abnormal gait, Decreased activity tolerance, Decreased balance, Decreased knowledge of precautions, Decreased endurance, Decreased coordination, Decreased cognition, Decreased knowledge of use of DME, Decreased mobility, Decreased range of motion, Decreased safety awareness, Difficulty walking, Decreased strength, Hypomobility, Impaired  flexibility, Impaired perceived functional ability, Impaired tone, Postural dysfunction, Improper body mechanics  Visit Diagnosis: Unsteadiness  Muscle weakness (generalized)  Other abnormalities of gait and mobility     Problem List Patient Active Problem List   Diagnosis Date Noted  . CAP (community acquired pneumonia) 01/27/2015  . Sepsis ([place]) 01/27/2015  . Community acquired bacterial pneumonia 01/27/2015  . Stroke ([place])   . Arthritis   . Carotid artery occlusion   . CVA (cerebral vascular accident) ([place])   . Hyperlipidemia   . Hypertension   . BPH (benign prostatic hyperplasia)   . Skin cancer   . Weakness   . Pain in limb-Right trunk 07/18/2013  . Occlusion and stenosis of carotid artery without mention of cerebral infarction 07/18/2013  . TIA (transient ischemic attack) 09/15/2011  . Diabetes mellitus without complication ([place]) 30/94/0768  . PERSONAL HISTORY OF COLONIC POLYPS 10/28/2009    Denea Cheaney DPT 03/26/2018, 2:22 PM  [place] MAIN [facility] SERVICES [address] [place], [place], [address] Phone: [phone]   Fax:  [phone]  Name: KESTON SEEVER MRN: [id number] Date of Birth: 07/24/1925

## 2018-03-28 ENCOUNTER — Encounter: Payer: Self-pay | Admitting: Physical Therapy

## 2018-03-28 ENCOUNTER — Ambulatory Visit: Payer: Medicare Other | Admitting: Physical Therapy

## 2018-03-28 DIAGNOSIS — R2689 Other abnormalities of gait and mobility: Secondary | ICD-10-CM

## 2018-03-28 DIAGNOSIS — R2681 Unsteadiness on feet: Secondary | ICD-10-CM

## 2018-03-28 DIAGNOSIS — M6281 Muscle weakness (generalized): Secondary | ICD-10-CM

## 2018-03-28 NOTE — Therapy (Signed)
[place] MAIN [facility] SERVICES [address] [place], [place], [address] Phone: [phone]   Fax:  [phone]  Physical Therapy Treatment  Patient Details  Name: Gregory Padilla MRN: [id number] Date of Birth: 10-11-1924 Referring Provider: Dr. Manuella Ghazi   Encounter Date: 03/28/2018  PT End of Session - 03/28/18 1416    Visit Number  5    Number of Visits  16    Date for PT Re-Evaluation  04/17/18    Authorization Type  progress note 5/10    PT Start Time  1350    PT Stop Time  1430    PT Time Calculation (min)  40 min    Equipment Utilized During Treatment  Gait belt    Activity Tolerance  Patient tolerated treatment well;Patient limited by fatigue    Behavior During Therapy  [facility] for tasks assessed/performed memory impairement, hearing impairment       Past Medical History:  Diagnosis Date  . Arthritis   . BPH (benign prostatic hyperplasia)   . Carotid artery occlusion   . Coccidioidomycosis   . CVA (cerebral vascular accident) ([place])    left sided weakness  . Degenerative joint disease   . Diabetes mellitus age 9   no insulin  . Hyperlipidemia   . Hypertension   . Meniere disease   . Skin cancer    history of skin cancer  . Stroke [facility])     Past Surgical History:  Procedure Laterality Date  . BACK SURGERY  2004  . CHOLECYSTECTOMY    . PROSTATE SURGERY     x2    There were no vitals filed for this visit.  Subjective Assessment - 03/28/18 1415    Subjective  Patient reports feeling stronger. He states, "I slide out of the bed last night and I was able to get back into the bed by myself."     Pertinent History  Patient is a pleasant 84 year old who has been seen in the past for balance/gait at this clinic and presents again for balance/gait. He did not complete his prior therapy. 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 Patient is hard of hearing and wears  hearing aids. Patient reports gradually getting slower and slower. Is now, according to family member supposed to stay in wheelchair in the past 4- 5 months. Is non ambulatory. Independent in dressing but takes a while. A woman comes in the morning helps 4 days/week. Reports doing 10-15 minutes of seated HEP.  Has difficulty getting pants up. Propels w/c in house with feet    Limitations  Standing;Walking;House hold activities;Sitting    How long can you sit comfortably?  n/a     How long can you stand comfortably?  challenging to stand    How long can you walk comfortably?  non ambulatory     Patient Stated Goals  wants to avoid being bedridden. Family member states to help get up and down out of wheelchair and improve strength.     Currently in Pain?  No/denies    Multiple Pain Sites  No         [facility] PT Assessment - 03/28/18 0001      Standardized Balance Assessment   10 Meter Walk  0.46 m/s with RW, min A for safety indicating high fall risk, limited home ambulator         TREATMENT: Patient instructed in stand pivot  transfer with RW chair to mat table, min A for safety;  Supine: Passive hamstring stretch to tolerable range of motion, with contract relax 5 sec hold, 10 sec rest, x3 reps each x2 sets; Roller stick to bilateral hamstring and posterior knee capsule x4 min each LE to facilitate better tissue extensibility;   Quad set 3 sec hold x20 reps bilaterally x1 sets with min VCs for positioning for better quad activation and improved knee ROM.   SLR hip flexion AROM2#x15 reps bilaterally with min Vcs to keep knee straight for better quad strengthening; Suspended heel slides 2# x10 bilaterally with min VCs for correct exercise technique;  Patient required min-moderate verbal/tactile cues for correct exercise techniqueincluding cues for correct position and to increase hold time for better strengthening and motor control;  Sidelying: Hip Abduction clamshells orange tband  2x15 with min VCs to avoid posterior roll for  Better hip strengthening;   Instructed patient in 10 meter walk to address gait safety, see above;   Patient appears weaker today likely due to recent fall.                    PT Education - 03/28/18 1416    Education provided  Yes    Education Details  LE strengthening, stretches, gait safety;     Person(s) Educated  Patient    Methods  Explanation;Demonstration;Verbal cues    Comprehension  Verbalized understanding;Returned demonstration;Verbal cues required;Need further instruction       PT Short Term Goals - 02/20/18 1724      PT SHORT TERM GOAL #1   Title  Patient will be independent in home exercise program to improve strength/mobility for better functional independence with ADLs.    Baseline  HEP given    Time  2    Period  Weeks    Status  New    Target Date  03/06/18      PT SHORT TERM GOAL #2   Title  Patient will perform SPT with supervision and BUE support to increase safety and mobility.     Baseline  requires CGA and BUE support     Time  2    Period  Weeks    Status  New    Target Date  03/06/18      PT SHORT TERM GOAL #3   Title  Patient will improve UE and LE strength to 4/5 for improved transfer and mobility.     Baseline  Gross 3+/5    Time  2    Period  Weeks    Status  New    Target Date  03/06/18      PT SHORT TERM GOAL #4   Title  Patient will demonstrate ability to stand with single UE support and reach for object  outside of BOS without LOB to increase independence in home.     Baseline  require BUE support    Time  2    Period  Weeks    Status  New    Target Date  03/06/18        PT Long Term Goals - 02/20/18 1727      PT LONG TERM GOAL #1   Title  Patient (> 19 years old) will complete five times sit to stand test in < 15 seconds indicating an increased LE strength and improved balance.    Baseline  72 seconds with excessive UE support     Time  8    Period  Weeks     Status  New    Target Date  04/17/18      PT LONG TERM GOAL #2   Title  Patient will increase BLE and BUE gross strength to 4+/5 as to improve functional strength for independent gait, increased standing tolerance and increased ADL ability.    Baseline  3/5 gross    Time  8    Period  Weeks    Status  New    Target Date  04/17/18      PT LONG TERM GOAL #3   Title  Patient will perform SPT independently from "average" chair height to improve mobility in and outside of home.     Baseline  requires CGA from raised surface with excessive UE support     Time  8    Period  Weeks    Status  New    Target Date  04/17/18      PT LONG TERM GOAL #4   Title  Patient will perform STS with SUE support to demonstrate improved LE strength and transfers.     Baseline  require BUE support     Time  8    Period  Weeks    Status  New    Target Date  04/17/18      PT LONG TERM GOAL #5   Title  Patient will stand without UE support for 2 minutes to increase independence with ADLs.     Baseline  require BUE support     Time  8    Period  Weeks    Status  New    Target Date  04/17/18            Plan - 03/28/18 1426    Clinical Impression Statement  Patient continues to have knee flexion contractures with difficulty extending LE. PT performed extensive stretches to help reduce tightness. PT instructed patient in advanced LE strengthening exercise. Instructed patient in suspended heel slides to facilitate better hip/knee strengthening.Patient appears weaker today likely due to recent fall.  Patient would benefit from additional skilled PT intervention to improve strength, balance and gait safety;     Rehab Potential  Fair    Clinical Impairments Affecting Rehab Potential  (+) good family support, motivated (-) dementia/memory/alzheimers. age    PT Frequency  2x / week    PT Duration  8 weeks    PT Treatment/Interventions  ADLs/Self Care Home Management;Biofeedback;Electrical  Stimulation;Functional mobility training;Gait training;DME Instruction;Therapeutic activities;Therapeutic exercise;Balance training;Neuromuscular re-education;Manual techniques;Wheelchair mobility training;Patient/family education;Passive range of motion;Dry needling;Vestibular;Taping;Splinting;Energy conservation    PT Next Visit Plan  stand in // bars. LE strength, transfer practice.     PT Home Exercise Plan  see sheet    Consulted and Agree with Plan of Care  Patient;Family member/caregiver    Family Member Consulted  son       Patient will benefit from skilled therapeutic intervention in order to improve the following deficits and impairments:  Abnormal gait, Decreased activity tolerance, Decreased balance, Decreased knowledge of precautions, Decreased endurance, Decreased coordination, Decreased cognition, Decreased knowledge of use of DME, Decreased mobility, Decreased range of motion, Decreased safety awareness, Difficulty walking, Decreased strength, Hypomobility, Impaired flexibility, Impaired perceived functional ability, Impaired tone, Postural dysfunction, Improper body mechanics  Visit Diagnosis: Unsteadiness  Muscle weakness (generalized)  Other abnormalities of gait and mobility     Problem List Patient Active Problem List   Diagnosis Date Noted  . CAP (community acquired pneumonia) 01/27/2015  . Sepsis (  [place]) 01/27/2015  . Community acquired bacterial pneumonia 01/27/2015  . Stroke ([place])   . Arthritis   . Carotid artery occlusion   . CVA (cerebral vascular accident) ([place])   . Hyperlipidemia   . Hypertension   . BPH (benign prostatic hyperplasia)   . Skin cancer   . Weakness   . Pain in limb-Right trunk 07/18/2013  . Occlusion and stenosis of carotid artery without mention of cerebral infarction 07/18/2013  . TIA (transient ischemic attack) 09/15/2011  . Diabetes mellitus without complication ([place]) 35/39/1225  . PERSONAL HISTORY OF COLONIC POLYPS 10/28/2009     Tolulope Pinkett DPT 03/28/2018, 2:44 PM  [place] MAIN [facility] SERVICES [address] [place], [place], [address] Phone: [phone]   Fax:  [phone]  Name: Gregory Padilla MRN: [id number] Date of Birth: 1924-11-16

## 2018-04-02 ENCOUNTER — Ambulatory Visit: Payer: Medicare Other | Admitting: Physical Therapy

## 2018-04-02 ENCOUNTER — Encounter: Payer: Self-pay | Admitting: Physical Therapy

## 2018-04-02 DIAGNOSIS — R2689 Other abnormalities of gait and mobility: Secondary | ICD-10-CM

## 2018-04-02 DIAGNOSIS — M6281 Muscle weakness (generalized): Secondary | ICD-10-CM

## 2018-04-02 DIAGNOSIS — R2681 Unsteadiness on feet: Secondary | ICD-10-CM

## 2018-04-02 NOTE — Therapy (Signed)
[place] MAIN [facility] SERVICES [address] [place], [place], [address] Phone: [phone]   Fax:  [phone]  Physical Therapy Treatment  Patient Details  Name: Gregory Padilla MRN: [id number] Date of Birth: 1925/09/09 Referring Provider: Dr. Manuella Ghazi   Encounter Date: 04/02/2018  PT End of Session - 04/02/18 1343    Visit Number  6    Number of Visits  16    Date for PT Re-Evaluation  04/17/18    Authorization Type  progress note 6/10    PT Start Time  1345    PT Stop Time  1430    PT Time Calculation (min)  45 min    Equipment Utilized During Treatment  Gait belt    Activity Tolerance  Patient tolerated treatment well;Patient limited by fatigue    Behavior During Therapy  [facility] for tasks assessed/performed memory impairement, hearing impairment       Past Medical History:  Diagnosis Date  . Arthritis   . BPH (benign prostatic hyperplasia)   . Carotid artery occlusion   . Coccidioidomycosis   . CVA (cerebral vascular accident) ([place])    left sided weakness  . Degenerative joint disease   . Diabetes mellitus age 23   no insulin  . Hyperlipidemia   . Hypertension   . Meniere disease   . Skin cancer    history of skin cancer  . Stroke [facility])     Past Surgical History:  Procedure Laterality Date  . BACK SURGERY  2004  . CHOLECYSTECTOMY    . PROSTATE SURGERY     x2    There were no vitals filed for this visit.  Subjective Assessment - 04/02/18 1358    Subjective  Patient reports feeling weak and states that he gets tired often. He reports still having stiffness in BLE with difficulty straightening out legs; Denies any pain currently;     Pertinent History  Patient is a pleasant 84 year old who has been seen in the past for balance/gait at this clinic and presents again for balance/gait. He did not complete his prior therapy. 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 Patient is hard of hearing and wears hearing aids. Patient reports gradually getting slower and slower. Is now, according to family member supposed to stay in wheelchair in the past 4- 5 months. Is non ambulatory. Independent in dressing but takes a while. A woman comes in the morning helps 4 days/week. Reports doing 10-15 minutes of seated HEP.  Has difficulty getting pants up. Propels w/c in house with feet    Limitations  Standing;Walking;House hold activities;Sitting    How long can you sit comfortably?  n/a     How long can you stand comfortably?  challenging to stand    How long can you walk comfortably?  non ambulatory     Patient Stated Goals  wants to avoid being bedridden. Family member states to help get up and down out of wheelchair and improve strength.     Currently in Pain?  No/denies    Multiple Pain Sites  No             TREATMENT: Patient instructed in stand pivot transfer with RW chair to mat table, min A for safety;  Supine: Passive hamstring stretch to tolerable range of motion, withcontract relax 5 sec hold, 10 sec rest, x3 reps each x2 sets; Roller stick to bilateral  hamstring and posterior knee capsule x4 min each LE to facilitate better tissue extensibility;  Passive Hamstring static stretch 30 sec hold x1 reps bilaterally;  Quad set 3 sec hold x15 reps bilaterally x1 sets with min VCs for positioning for better quad activation and improved knee ROM. SLR hip flexion AROM2#[id number] bilaterally with min Vcs to keep knee straight for better quad strengthening; SAQ 2#, 5 sec hold, x15 bilaterally Patient required min-moderate verbal/tactile cues for correct exercise techniqueincluding cues for correct position and to increase hold time for better strengthening and motor control;  Sit to stand from mat table x10 reps with min Vcs for correct hand placement and to increase push through LE;   Patient ambulated 10 feet mat table<>wheelchair x2  sets with min A for safety and mod VCs to increase erect posture and increase foot clearance;                     PT Education - 04/02/18 1342    Education provided  Yes    Education Details  LE strengthening, stretches, gait safety/transfers    Person(s) Educated  Patient    Methods  Explanation;Demonstration;Verbal cues    Comprehension  Verbalized understanding;Returned demonstration;Verbal cues required;Need further instruction       PT Short Term Goals - 02/20/18 1724      PT SHORT TERM GOAL #1   Title  Patient will be independent in home exercise program to improve strength/mobility for better functional independence with ADLs.    Baseline  HEP given    Time  2    Period  Weeks    Status  New    Target Date  03/06/18      PT SHORT TERM GOAL #2   Title  Patient will perform SPT with supervision and BUE support to increase safety and mobility.     Baseline  requires CGA and BUE support     Time  2    Period  Weeks    Status  New    Target Date  03/06/18      PT SHORT TERM GOAL #3   Title  Patient will improve UE and LE strength to 4/5 for improved transfer and mobility.     Baseline  Gross 3+/5    Time  2    Period  Weeks    Status  New    Target Date  03/06/18      PT SHORT TERM GOAL #4   Title  Patient will demonstrate ability to stand with single UE support and reach for object  outside of BOS without LOB to increase independence in home.     Baseline  require BUE support    Time  2    Period  Weeks    Status  New    Target Date  03/06/18        PT Long Term Goals - 02/20/18 1727      PT LONG TERM GOAL #1   Title  Patient (> 51 years old) will complete five times sit to stand test in < 15 seconds indicating an increased LE strength and improved balance.    Baseline  72 seconds with excessive UE support     Time  8    Period  Weeks    Status  New    Target Date  04/17/18      PT LONG TERM GOAL #2   Title  Patient will increase BLE and  BUE  gross strength to 4+/5 as to improve functional strength for independent gait, increased standing tolerance and increased ADL ability.    Baseline  3/5 gross    Time  8    Period  Weeks    Status  New    Target Date  04/17/18      PT LONG TERM GOAL #3   Title  Patient will perform SPT independently from "average" chair height to improve mobility in and outside of home.     Baseline  requires CGA from raised surface with excessive UE support     Time  8    Period  Weeks    Status  New    Target Date  04/17/18      PT LONG TERM GOAL #4   Title  Patient will perform STS with SUE support to demonstrate improved LE strength and transfers.     Baseline  require BUE support     Time  8    Period  Weeks    Status  New    Target Date  04/17/18      PT LONG TERM GOAL #5   Title  Patient will stand without UE support for 2 minutes to increase independence with ADLs.     Baseline  require BUE support     Time  8    Period  Weeks    Status  New    Target Date  04/17/18            Plan - 04/02/18 1520    Clinical Impression Statement  Patient instructed in advanced LE stretches. He continues to have knee flexion contractures. Advanced HEP with knee extension stretches. He was educated to sit with legs propped to reduce hamstring tightness. Patient also educated in quad strengthening to facilitate better knee control in stance and improve sit<>Stand ability. He would benefit from additional skilled PT Intervention to improve strength, balance and gait safety;     Rehab Potential  Fair    Clinical Impairments Affecting Rehab Potential  (+) good family support, motivated (-) dementia/memory/alzheimers. age    PT Frequency  2x / week    PT Duration  8 weeks    PT Treatment/Interventions  ADLs/Self Care Home Management;Biofeedback;Electrical Stimulation;Functional mobility training;Gait training;DME Instruction;Therapeutic activities;Therapeutic exercise;Balance training;Neuromuscular  re-education;Manual techniques;Wheelchair mobility training;Patient/family education;Passive range of motion;Dry needling;Vestibular;Taping;Splinting;Energy conservation    PT Next Visit Plan  stand in // bars. LE strength, transfer practice.     PT Home Exercise Plan  see sheet    Consulted and Agree with Plan of Care  Patient;Family member/caregiver    Family Member Consulted  son       Patient will benefit from skilled therapeutic intervention in order to improve the following deficits and impairments:  Abnormal gait, Decreased activity tolerance, Decreased balance, Decreased knowledge of precautions, Decreased endurance, Decreased coordination, Decreased cognition, Decreased knowledge of use of DME, Decreased mobility, Decreased range of motion, Decreased safety awareness, Difficulty walking, Decreased strength, Hypomobility, Impaired flexibility, Impaired perceived functional ability, Impaired tone, Postural dysfunction, Improper body mechanics  Visit Diagnosis: Unsteadiness  Muscle weakness (generalized)  Other abnormalities of gait and mobility     Problem List Patient Active Problem List   Diagnosis Date Noted  . CAP (community acquired pneumonia) 01/27/2015  . Sepsis ([place]) 01/27/2015  . Community acquired bacterial pneumonia 01/27/2015  . Stroke ([place])   . Arthritis   . Carotid artery occlusion   . CVA (cerebral vascular accident) ([place])   .  Hyperlipidemia   . Hypertension   . BPH (benign prostatic hyperplasia)   . Skin cancer   . Weakness   . Pain in limb-Right trunk 07/18/2013  . Occlusion and stenosis of carotid artery without mention of cerebral infarction 07/18/2013  . TIA (transient ischemic attack) 09/15/2011  . Diabetes mellitus without complication ([place]) 08/65/7846  . PERSONAL HISTORY OF COLONIC POLYPS 10/28/2009    Rolena Knutson PT, DPT 04/02/2018, 3:22 PM  [place] MAIN [facility] SERVICES [address]  [place], [place], [address] Phone: [phone]   Fax:  [phone]  Name: Gregory Padilla MRN: [id number] Date of Birth: 1925/10/09

## 2018-04-04 ENCOUNTER — Ambulatory Visit: Payer: Medicare Other | Admitting: Physical Therapy

## 2018-04-04 ENCOUNTER — Encounter: Payer: Self-pay | Admitting: Physical Therapy

## 2018-04-04 DIAGNOSIS — R2681 Unsteadiness on feet: Secondary | ICD-10-CM

## 2018-04-04 DIAGNOSIS — R2689 Other abnormalities of gait and mobility: Secondary | ICD-10-CM

## 2018-04-04 DIAGNOSIS — R269 Unspecified abnormalities of gait and mobility: Secondary | ICD-10-CM

## 2018-04-04 DIAGNOSIS — M6281 Muscle weakness (generalized): Secondary | ICD-10-CM

## 2018-04-04 NOTE — Therapy (Addendum)
[place] MAIN [facility] SERVICES [address] [place], [place], [address] Phone: [phone]   Fax:  [phone]  Physical Therapy Treatment  Patient Details  Name: Gregory Padilla MRN: [id number] Date of Birth: [birth year] Referring Provider: Dr. Manuella Ghazi   Encounter Date: 04/04/2018  PT End of Session - 04/04/18 1352    Visit Number  7    Number of Visits  16    Date for PT Re-Evaluation  04/17/18    Authorization Type  progress note 7/10    PT Start Time  1348    PT Stop Time  1430    PT Time Calculation (min)  42 min    Equipment Utilized During Treatment  Gait belt    Activity Tolerance  Patient tolerated treatment well;Patient limited by fatigue    Behavior During Therapy  [facility] for tasks assessed/performed memory impairement, hearing impairment       Past Medical History:  Diagnosis Date  . Arthritis   . BPH (benign prostatic hyperplasia)   . Carotid artery occlusion   . Coccidioidomycosis   . CVA (cerebral vascular accident) ([place])    left sided weakness  . Degenerative joint disease   . Diabetes mellitus age 89   no insulin  . Hyperlipidemia   . Hypertension   . Meniere disease   . Skin cancer    history of skin cancer  . Stroke [facility])     Past Surgical History:  Procedure Laterality Date  . BACK SURGERY  2004  . CHOLECYSTECTOMY    . PROSTATE SURGERY     x2    There were no vitals filed for this visit.  Subjective Assessment - 04/04/18 1353    Subjective  Pt states he is feeling better today and feels stronger as the day goes on. Pt reports he spends most of his day doing exercises; denies any pain.     Pertinent History  Patient is a pleasant 84 year old who has been seen in the past for balance/gait at this clinic and presents again for balance/gait. He did not complete his prior therapy. 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 Patient is hard of  hearing and wears hearing aids. Patient reports gradually getting slower and slower. Is now, according to family member supposed to stay in wheelchair in the past 4- 5 months. Is non ambulatory. Independent in dressing but takes a while. A woman comes in the morning helps 4 days/week. Reports doing 10-15 minutes of seated HEP.  Has difficulty getting pants up. Propels w/c in house with feet    Limitations  Standing;Walking;House hold activities;Sitting    How long can you sit comfortably?  n/a     How long can you stand comfortably?  challenging to stand    How long can you walk comfortably?  non ambulatory     Patient Stated Goals  wants to avoid being bedridden. Family member states to help get up and down out of wheelchair and improve strength.     Currently in Pain?  No/denies       TREATMENT: Pt instructed in sit <> stand transfer and amb over to chair; min A for safety with RW  Review HEP and instructed in proper exercise technique and sequencing: Sitting therex: Hip abduction with green tband x10 reps x2 sets Alternating marching with green tband x10 reps bilaterally x2 sets Knee flexion with green tband around  ankles x10 reps bilaterally x2 sets Ankle DF against green tband resistance around toes x10 reps bilaterally x2 sets Heel/toe raises x10 reps each direction x2 sets  Patient required min-moderate verbal/tactile cues for correct exercise technique including positioning.   Supine therex: Double knee trunk rotation x10 reps bilaterally with VCs for keeping knees together Bridging x5 reps with arms by sides with VCs for lifting bottom all the way off the mat table Single knee to chest x20 sec holds x2 reps bilaterally, VCs for technique and hand placement Straight leg raise x10 reps bilaterally with VCs for proper technique and trying to keep knee straight throughout movement Sidelying clamshells x10 reps bilaterally with VCs for keeping ankles together and avoiding posterior  lean, tactile cuing to keep hips pulled forwards             PT Education - 04/04/18 1352    Education provided  Yes    Education Details  stretches, LE strengthening, gait safety, transfers    Person(s) Educated  Patient    Methods  Explanation;Demonstration;Verbal cues    Comprehension  Verbalized understanding;Returned demonstration;Verbal cues required;Need further instruction       PT Short Term Goals - 02/20/18 1724      PT SHORT TERM GOAL #1   Title  Patient will be independent in home exercise program to improve strength/mobility for better functional independence with ADLs.    Baseline  HEP given    Time  2    Period  Weeks    Status  New    Target Date  03/06/18      PT SHORT TERM GOAL #2   Title  Patient will perform SPT with supervision and BUE support to increase safety and mobility.     Baseline  requires CGA and BUE support     Time  2    Period  Weeks    Status  New    Target Date  03/06/18      PT SHORT TERM GOAL #3   Title  Patient will improve UE and LE strength to 4/5 for improved transfer and mobility.     Baseline  Gross 3+/5    Time  2    Period  Weeks    Status  New    Target Date  03/06/18      PT SHORT TERM GOAL #4   Title  Patient will demonstrate ability to stand with single UE support and reach for object  outside of BOS without LOB to increase independence in home.     Baseline  require BUE support    Time  2    Period  Weeks    Status  New    Target Date  03/06/18        PT Long Term Goals - 02/20/18 1727      PT LONG TERM GOAL #1   Title  Patient (> 1 years old) will complete five times sit to stand test in < 15 seconds indicating an increased LE strength and improved balance.    Baseline  72 seconds with excessive UE support     Time  8    Period  Weeks    Status  New    Target Date  04/17/18      PT LONG TERM GOAL #2   Title  Patient will increase BLE and BUE gross strength to 4+/5 as to improve functional  strength for independent gait, increased standing tolerance and increased ADL ability.  Baseline  3/5 gross    Time  8    Period  Weeks    Status  New    Target Date  04/17/18      PT LONG TERM GOAL #3   Title  Patient will perform SPT independently from "average" chair height to improve mobility in and outside of home.     Baseline  requires CGA from raised surface with excessive UE support     Time  8    Period  Weeks    Status  New    Target Date  04/17/18      PT LONG TERM GOAL #4   Title  Patient will perform STS with SUE support to demonstrate improved LE strength and transfers.     Baseline  require BUE support     Time  8    Period  Weeks    Status  New    Target Date  04/17/18      PT LONG TERM GOAL #5   Title  Patient will stand without UE support for 2 minutes to increase independence with ADLs.     Baseline  require BUE support     Time  8    Period  Weeks    Status  New    Target Date  04/17/18            Plan - 04/04/18 1353    Clinical Impression Statement  Patient tolerated therapy session well. Pt required VCs for technique and sequencing of seated and supine therapeutic exercise to reinforce HEP. Pt compliant with HEP at home; will be coming to therapy for one more session to reinforce HEP and be sure comfortable with all exercises prior to D/C. Pt would continue to benefit from skilled therapy for strength, balance, and gait safety.    Rehab Potential  Fair    Clinical Impairments Affecting Rehab Potential  (+) good family support, motivated (-) dementia/memory/alzheimers. age    PT Frequency  2x / week    PT Duration  8 weeks    PT Treatment/Interventions  ADLs/Self Care Home Management;Biofeedback;Electrical Stimulation;Functional mobility training;Gait training;DME Instruction;Therapeutic activities;Therapeutic exercise;Balance training;Neuromuscular re-education;Manual techniques;Wheelchair mobility training;Patient/family education;Passive range  of motion;Dry needling;Vestibular;Taping;Splinting;Energy conservation    PT Next Visit Plan  stand in // bars. LE strength, transfer practice.     PT Home Exercise Plan  see sheet    Consulted and Agree with Plan of Care  Patient;Family member/caregiver    Family Member Consulted  son       Patient will benefit from skilled therapeutic intervention in order to improve the following deficits and impairments:  Abnormal gait, Decreased activity tolerance, Decreased balance, Decreased knowledge of precautions, Decreased endurance, Decreased coordination, Decreased cognition, Decreased knowledge of use of DME, Decreased mobility, Decreased range of motion, Decreased safety awareness, Difficulty walking, Decreased strength, Hypomobility, Impaired flexibility, Impaired perceived functional ability, Impaired tone, Postural dysfunction, Improper body mechanics  Visit Diagnosis: Unsteadiness  Muscle weakness (generalized)  Other abnormalities of gait and mobility  Abnormality of gait     Problem List Patient Active Problem List   Diagnosis Date Noted  . CAP (community acquired pneumonia) 01/27/2015  . Sepsis ([place]) 01/27/2015  . Community acquired bacterial pneumonia 01/27/2015  . Stroke ([place])   . Arthritis   . Carotid artery occlusion   . CVA (cerebral vascular accident) ([place])   . Hyperlipidemia   . Hypertension   . BPH (benign prostatic hyperplasia)   . Skin cancer   .  Weakness   . Pain in limb-Right trunk 07/18/2013  . Occlusion and stenosis of carotid artery without mention of cerebral infarction 07/18/2013  . TIA (transient ischemic attack) 09/15/2011  . Diabetes mellitus without complication ([place]) 03/75/4360  . PERSONAL HISTORY OF COLONIC POLYPS 10/28/2009   Harriet Masson, SPT This entire session was performed under direct supervision and direction of a licensed therapist/therapist assistant . I have personally read, edited and approve of the note as  written.  Trotter,Margaret PT, DPT 04/04/2018, 5:32 PM  [place] MAIN [facility] SERVICES [address] [place], [place], [address] Phone: [phone]   Fax:  [phone]  Name: Gregory Padilla MRN: [id number] Date of Birth: 1925/09/26

## 2018-04-04 NOTE — Patient Instructions (Signed)
ABDUCTION: Sitting - Exercise Ball: Resistance Band (Active)   Sit with feet flat. With band tied around both legs, Lift right leg slightly and, against resistance band, draw it out to side. Complete __2_ sets of __10_ repetitions. Perform _2__ sessions per day.  Copyright  VHI. All rights reserved.  FLEXION: Sitting - Resistance Band (Active)   Sit, both feet flat. Have band tied around both legs above knees, lift right knee toward ceiling.Repeat with other knee Complete _2__ sets of _10__ repetitions. Perform _2__ sessions per day.  http://gtsc.exer.us/21   Knee Extension: Resisted (Sitting)   With band looped around right ankle and under other foot, straighten leg with ankle loop. Keep other leg bent to increase resistance. Repeat _10___ times per set. Do __2__ sets per session. Do _2___ sessions per day.  http://orth.exer.us/691   Copyright  VHI. All rights reserved.   Copyright  VHI. All rights reserved.  Copyright  VHI. All rights reserved.  FLEXION: Sitting - Resistance Band (Active)   Sit with right foot flat. Have band tied around both feet, bend ankle, bringing toes toward head. Complete __2_ sets of __10_ repetitions. Perform _2__ sessions per day.  Copyright  VHI. All rights reserved.  Toe / Heel Raise (Sitting)   Sitting, raise heels, then rock back on heels and raise toes. Repeat _10___ times.  Copyright  VHI. All rights reserved.  HIP / KNEE: Extension - Sit to Stand   Sitting, lean chest forward, raise hips up from surface. Straighten hips and knees. Weight bear equally on left and right sides. Backs of legs should not push off surface. __10_ reps per set, __2_ sets per day, _5__ days per week Use assistive device as needed.

## 2018-04-09 ENCOUNTER — Ambulatory Visit: Payer: Medicare Other | Admitting: Physical Therapy

## 2018-04-11 ENCOUNTER — Encounter: Payer: Self-pay | Admitting: Physical Therapy

## 2018-04-11 ENCOUNTER — Ambulatory Visit: Payer: Medicare Other | Attending: Neurology | Admitting: Physical Therapy

## 2018-04-11 DIAGNOSIS — R2681 Unsteadiness on feet: Secondary | ICD-10-CM

## 2018-04-11 DIAGNOSIS — R269 Unspecified abnormalities of gait and mobility: Secondary | ICD-10-CM

## 2018-04-11 DIAGNOSIS — R2689 Other abnormalities of gait and mobility: Secondary | ICD-10-CM

## 2018-04-11 DIAGNOSIS — M6281 Muscle weakness (generalized): Secondary | ICD-10-CM | POA: Diagnosis present

## 2018-04-11 NOTE — Therapy (Addendum)
[facility] MAIN REHAB SERVICES [address] , [place], [address] Phone: [phone]   Fax:  [phone]  Physical Therapy Treatment/Discharge Summary  Patient Details  Name: Gregory Padilla MRN: [id number] Date of Birth: 02/15/1925 Referring Provider: Dr. Shah   Encounter Date: 04/11/2018  PT End of Session - 04/11/18 1459    Visit Number  8    Number of Visits  16    Date for PT Re-Evaluation  04/17/18    Authorization Type  progress note 8/10    PT Start Time  1515    PT Stop Time  1600    PT Time Calculation (min)  45 min    Equipment Utilized During Treatment  Gait belt    Activity Tolerance  Patient tolerated treatment well;Patient limited by fatigue    Behavior During Therapy  WFL for tasks assessed/performed memory impairement, hearing impairment       Past Medical History:  Diagnosis Date  . Arthritis   . BPH (benign prostatic hyperplasia)   . Carotid artery occlusion   . Coccidioidomycosis   . CVA (cerebral vascular accident) (HCC)    left sided weakness  . Degenerative joint disease   . Diabetes mellitus age 84   no insulin  . Hyperlipidemia   . Hypertension   . Meniere disease   . Skin cancer    history of skin cancer  . Stroke (HCC)     Past Surgical History:  Procedure Laterality Date  . BACK SURGERY  2004  . CHOLECYSTECTOMY    . PROSTATE SURGERY     x2    There were no vitals filed for this visit.  Subjective Assessment - 04/11/18 1520    Subjective  Patient reports feeling weak since last visit; still completing HEP. Pt denies any pain today; denies any new falls since last visit. Pt reports noticing the pain in his back has decreased; able to sleep better without back pain.     Pertinent History  Patient is a pleasant 84 year old who has been seen in the past for balance/gait at this clinic and presents again for balance/gait. He did not complete his prior therapy. Patient does have mixed  alzheimer's and vascular dementia as well as takes care of his wife who has dementia. Patient is hard of hearing and wears hearing aids. Patient reports gradually getting slower and slower. Is now, according to family member supposed to stay in wheelchair in the past 4- 5 months. Is non ambulatory. Independent in dressing but takes a while. A woman comes in the morning helps 4 days/week. Reports doing 10-15 minutes of seated HEP.  Has difficulty getting pants up. Propels w/c in house with feet    Limitations  Standing;Walking;House hold activities;Sitting    How long can you sit comfortably?  n/a     How long can you stand comfortably?  challenging to stand    How long can you walk comfortably?  non ambulatory     Patient Stated Goals  wants to avoid being bedridden. Family member states to help get up and down out of wheelchair and improve strength.     Currently in Pain?  No/denies        [facility] PT Assessment - 04/11/18 0001      Strength   Right Hip Flexion  4+/5    Right Hip ABduction  4/5    Right Hip ADduction  4/5    Left Hip Flexion  4-/5      Left Hip ABduction  4/5    Left Hip ADduction  4/5    Right Knee Flexion  4/5    Right Knee Extension  4/5    Left Knee Flexion  4/5    Left Knee Extension  4/5    Right Ankle Dorsiflexion  4/5    Left Ankle Dorsiflexion  4/5      Transfers   Five time sit to stand comments   39.8 seconds with UE support      Treatment Reviewed HEP: Seated: LAQs x5 sec holds x10 reps bilaterally, VCs required for proper technique and positioning Scapular retractions x5 sec holds x10 reps bilaterally, VCs required for proper technique and positioning Hip abduction with green tband x15 reps bilaterally, VCs required for proper technique  Alternating marching against green tband x10 reps bilaterally, VCs required for proper technique and lifting knee as high as possible Knee extension against green tband x10 reps bilaterally, VCs for proper technique with  min A to get tband in right position for exercise Resisted DF with green tband x10 reps bilaterally, VCs required for proper technique Heel raises/toe raises x15 reps bilaterally, VCs for proper technique Hip adduction/pillow squeezes x5 sec holds x10 reps, VCs for proper sequencing and utilizing pillow since does not have a ball at home Bicep curl with green tband x10 reps bilaterally, VCs for proper sequencing and placing band under foot for anchor  5 x STS 39.8 sec with UE support  Supine HEP review: Double knee trunk rotation x1 min, VCs for technique and maintaining slow speed to get deep stretch Bridging x5 reps, VCs for lifting off the table slowly and maintaining good form Single knee to chest x15 sec holds bilaterally, VCs for hand positioning and technique Straight leg raise x10 reps bilaterally, VCs for keeping the knee straight           PT Education - 04/11/18 1458    Education provided  Yes    Education Details  HEP reinforced, goals    Person(s) Educated  Patient    Methods  Explanation;Demonstration;Verbal cues    Comprehension  Verbalized understanding;Returned demonstration;Verbal cues required       PT Short Term Goals - 04/11/18 1656      PT SHORT TERM GOAL #1   Title  Patient will be independent in home exercise program to improve strength/mobility for better functional independence with ADLs.    Baseline  HEP given    Time  2    Period  Weeks    Status  Achieved      PT SHORT TERM GOAL #2   Title  Patient will perform SPT with supervision and BUE support to increase safety and mobility.     Baseline  requires CGA and BUE support     Time  2    Period  Weeks    Status  Not Met      PT SHORT TERM GOAL #3   Title  Patient will improve UE and LE strength to 4/5 for improved transfer and mobility.     Baseline  Gross 3+/5; 04/11/18: gross 4/5 in LE    Time  2    Period  Weeks    Status  Achieved      PT SHORT TERM GOAL #4   Title  Patient will  demonstrate ability to stand with single UE support and reach for object  outside of BOS without LOB to increase independence in home.     Baseline    require BUE support    Time  2    Period  Weeks    Status  Not Met        PT Long Term Goals - 04/11/18 1658      PT LONG TERM GOAL #1   Title  Patient (> 60 years old) will complete five times sit to stand test in < 15 seconds indicating an increased LE strength and improved balance.    Baseline  72 seconds with excessive UE support; 04/11/18: 39.8 sec with UE support    Time  8    Period  Weeks    Status  Partially Met      PT LONG TERM GOAL #2   Title  Patient will increase BLE and BUE gross strength to 4+/5 as to improve functional strength for independent gait, increased standing tolerance and increased ADL ability.    Baseline  3/5 gross; 4/5 gross    Time  8    Period  Weeks    Status  Partially Met      PT LONG TERM GOAL #3   Title  Patient will perform SPT independently from "average" chair height to improve mobility in and outside of home.     Baseline  requires CGA from raised surface with excessive UE support     Time  8    Period  Weeks    Status  Not Met      PT LONG TERM GOAL #4   Title  Patient will perform STS with SUE support to demonstrate improved LE strength and transfers.     Baseline  require BUE support     Time  8    Period  Weeks    Status  Not Met      PT LONG TERM GOAL #5   Title  Patient will stand without UE support for 2 minutes to increase independence with ADLs.     Baseline  require BUE support     Time  8    Period  Weeks    Status  Not Met            Plan - 04/11/18 1459    Clinical Impression Statement  Patient tolerated therapy session well. Pt demonstrated knowledge and confidence in performing HEP exercises in supine and sitting; VCs for proper technique and sequencing. Pt performed sit <> stand with CGA and RW for safety; SPT with CGA and RW. Pt demonstrated improvement in 5xSTS  time with use of UE to complete in 39.8 sec. Pt also demonstrated improvements in LE strength with 4/5 grossly throughout BLE. Pt being discharged at this time due to financial reasons as well as family unable to bring patient to therapy.   Rehab Potential  Fair    Clinical Impairments Affecting Rehab Potential  (+) good family support, motivated (-) dementia/memory/alzheimers. age    PT Frequency  2x / week    PT Duration  8 weeks    PT Treatment/Interventions  ADLs/Self Care Home Management;Biofeedback;Electrical Stimulation;Functional mobility training;Gait training;DME Instruction;Therapeutic activities;Therapeutic exercise;Balance training;Neuromuscular re-education;Manual techniques;Wheelchair mobility training;Patient/family education;Passive range of motion;Dry needling;Vestibular;Taping;Splinting;Energy conservation    PT Next Visit Plan  stand in // bars. LE strength, transfer practice.     PT Home Exercise Plan  see sheet    Consulted and Agree with Plan of Care  Patient;Family member/caregiver    Family Member Consulted  son       Patient will benefit from skilled therapeutic intervention in order to   improve the following deficits and impairments:  Abnormal gait, Decreased activity tolerance, Decreased balance, Decreased knowledge of precautions, Decreased endurance, Decreased coordination, Decreased cognition, Decreased knowledge of use of DME, Decreased mobility, Decreased range of motion, Decreased safety awareness, Difficulty walking, Decreased strength, Hypomobility, Impaired flexibility, Impaired perceived functional ability, Impaired tone, Postural dysfunction, Improper body mechanics  Visit Diagnosis: Unsteadiness  Muscle weakness (generalized)  Other abnormalities of gait and mobility  Abnormality of gait     Problem List Patient Active Problem List   Diagnosis Date Noted  . CAP (community acquired pneumonia) 01/27/2015  . Sepsis ([place]) 01/27/2015  . Community  acquired bacterial pneumonia 01/27/2015  . Stroke ([place])   . Arthritis   . Carotid artery occlusion   . CVA (cerebral vascular accident) ([place])   . Hyperlipidemia   . Hypertension   . BPH (benign prostatic hyperplasia)   . Skin cancer   . Weakness   . Pain in limb-Right trunk 07/18/2013  . Occlusion and stenosis of carotid artery without mention of cerebral infarction 07/18/2013  . TIA (transient ischemic attack) 09/15/2011  . Diabetes mellitus without complication ([place]) 29/56/2130  . PERSONAL HISTORY OF COLONIC POLYPS 10/28/2009   Harriet Masson, SPT This entire session was performed under direct supervision and direction of a licensed therapist/therapist assistant . I have personally read, edited and approve of the note as written.  Trotter,Margaret PT, DPT 04/11/2018, 5:23 PM  [facility] SERVICES [address] [place], [place], [address] Phone: [phone]   Fax:  [phone]  Name: DRAXTON LUU MRN: [id number] Date of Birth: 07-24-1925

## 2018-04-16 ENCOUNTER — Ambulatory Visit: Payer: Medicare Other | Admitting: Physical Therapy

## 2018-04-18 ENCOUNTER — Ambulatory Visit: Payer: Medicare Other | Admitting: Physical Therapy

## 2018-04-24 ENCOUNTER — Ambulatory Visit: Payer: Medicare Other | Admitting: Physical Therapy

## 2018-04-30 ENCOUNTER — Ambulatory Visit: Payer: Medicare Other | Admitting: Physical Therapy

## 2018-05-02 ENCOUNTER — Ambulatory Visit: Payer: Medicare Other | Admitting: Physical Therapy

## 2018-05-07 ENCOUNTER — Ambulatory Visit: Payer: Medicare Other | Admitting: Physical Therapy

## 2018-05-09 ENCOUNTER — Encounter: Payer: Medicare Other | Admitting: Physical Therapy

## 2018-10-17 ENCOUNTER — Encounter (HOSPITAL_COMMUNITY): Payer: Medicare Other

## 2018-10-17 ENCOUNTER — Ambulatory Visit: Payer: Medicare Other | Admitting: Family

## 2018-10-21 ENCOUNTER — Emergency Department: Payer: Medicare Other

## 2018-10-21 ENCOUNTER — Other Ambulatory Visit: Payer: Self-pay

## 2018-10-21 ENCOUNTER — Emergency Department
Admission: EM | Admit: 2018-10-21 | Discharge: 2018-10-21 | Disposition: A | Discharge status: Home / Self Care | Payer: Medicare Other | Attending: Emergency Medicine | Admitting: Emergency Medicine

## 2018-10-21 DIAGNOSIS — Z8673 Personal history of transient ischemic attack (TIA), and cerebral infarction without residual deficits: Secondary | ICD-10-CM | POA: Insufficient documentation

## 2018-10-21 DIAGNOSIS — Z85828 Personal history of other malignant neoplasm of skin: Secondary | ICD-10-CM | POA: Insufficient documentation

## 2018-10-21 DIAGNOSIS — Z7982 Long term (current) use of aspirin: Secondary | ICD-10-CM | POA: Insufficient documentation

## 2018-10-21 DIAGNOSIS — Z9049 Acquired absence of other specified parts of digestive tract: Secondary | ICD-10-CM | POA: Diagnosis not present

## 2018-10-21 DIAGNOSIS — R410 Disorientation, unspecified: Secondary | ICD-10-CM | POA: Diagnosis present

## 2018-10-21 DIAGNOSIS — I10 Essential (primary) hypertension: Secondary | ICD-10-CM | POA: Insufficient documentation

## 2018-10-21 DIAGNOSIS — Z7984 Long term (current) use of oral hypoglycemic drugs: Secondary | ICD-10-CM | POA: Diagnosis not present

## 2018-10-21 DIAGNOSIS — Z79899 Other long term (current) drug therapy: Secondary | ICD-10-CM | POA: Diagnosis not present

## 2018-10-21 DIAGNOSIS — R079 Chest pain, unspecified: Secondary | ICD-10-CM | POA: Insufficient documentation

## 2018-10-21 DIAGNOSIS — M546 Pain in thoracic spine: Secondary | ICD-10-CM | POA: Insufficient documentation

## 2018-10-21 DIAGNOSIS — E119 Type 2 diabetes mellitus without complications: Secondary | ICD-10-CM | POA: Diagnosis not present

## 2018-10-21 DIAGNOSIS — Z7902 Long term (current) use of antithrombotics/antiplatelets: Secondary | ICD-10-CM | POA: Diagnosis not present

## 2018-10-21 LAB — CBC WITH DIFFERENTIAL/PLATELET
Abs Immature Granulocytes: 0.04 10*3/uL (ref 0.00–0.07)
BASOS ABS: 0 10*3/uL (ref 0.0–0.1)
Basophils Relative: 0 %
Eosinophils Absolute: 0 10*3/uL (ref 0.0–0.5)
Eosinophils Relative: 0 %
HEMATOCRIT: 37.3 % — AB (ref 39.0–52.0)
Hemoglobin: 12.4 g/dL — ABNORMAL LOW (ref 13.0–17.0)
IMMATURE GRANULOCYTES: 0 %
LYMPHS ABS: 1.9 10*3/uL (ref 0.7–4.0)
Lymphocytes Relative: 13 %
MCH: 31.1 pg (ref 26.0–34.0)
MCHC: 33.2 g/dL (ref 30.0–36.0)
MCV: 93.5 fL (ref 80.0–100.0)
Monocytes Absolute: 2 10*3/uL — ABNORMAL HIGH (ref 0.1–1.0)
Monocytes Relative: 14 %
Neutro Abs: 10 10*3/uL — ABNORMAL HIGH (ref 1.7–7.7)
Neutrophils Relative %: 73 %
Platelets: 200 10*3/uL (ref 150–400)
RBC: 3.99 MIL/uL — ABNORMAL LOW (ref 4.22–5.81)
RDW: 11.5 % (ref 11.5–15.5)
WBC: 14 10*3/uL — ABNORMAL HIGH (ref 4.0–10.5)
nRBC: 0 % (ref 0.0–0.2)

## 2018-10-21 LAB — URINALYSIS, COMPLETE (UACMP) WITH MICROSCOPIC
Bacteria, UA: NONE SEEN
Bilirubin Urine: NEGATIVE
Glucose, UA: NEGATIVE mg/dL
Hgb urine dipstick: NEGATIVE
Ketones, ur: 20 mg/dL — AB
Leukocytes, UA: NEGATIVE
Nitrite: NEGATIVE
Protein, ur: NEGATIVE mg/dL
Specific Gravity, Urine: 1.017 (ref 1.005–1.030)
Squamous Epithelial / HPF: NONE SEEN (ref 0–5)
pH: 7 (ref 5.0–8.0)

## 2018-10-21 LAB — COMPREHENSIVE METABOLIC PANEL
ALK PHOS: 81 U/L (ref 38–126)
ALT: 17 U/L (ref 0–44)
AST: 21 U/L (ref 15–41)
Albumin: 4.2 g/dL (ref 3.5–5.0)
Anion gap: 9 (ref 5–15)
BUN: 14 mg/dL (ref 8–23)
CO2: 26 mmol/L (ref 22–32)
Calcium: 9.1 mg/dL (ref 8.9–10.3)
Chloride: 103 mmol/L (ref 98–111)
Creatinine, Ser: 1.09 mg/dL (ref 0.61–1.24)
GFR calc Af Amer: >60 mL/min (ref >60)
GFR calc non Af Amer: 59 mL/min — ABNORMAL LOW (ref >60)
Glucose, Bld: 148 mg/dL — ABNORMAL HIGH (ref 70–99)
Potassium: 4.2 mmol/L (ref 3.5–5.1)
SODIUM: 138 mmol/L (ref 135–145)
Total Bilirubin: 1 mg/dL (ref 0.3–1.2)
Total Protein: 7.1 g/dL (ref 6.5–8.1)

## 2018-10-21 LAB — TROPONIN I: Troponin I: <0.03 ng/mL (ref <0.03)

## 2018-10-21 LAB — PROTIME-INR
INR: 1
Prothrombin Time: 13.1 seconds (ref 11.4–15.2)

## 2018-10-21 LAB — LIPASE, BLOOD: LIPASE: 43 U/L (ref 11–51)

## 2018-10-21 MED ORDER — IOHEXOL 350 MG/ML SOLN
100.0000 mL | Freq: Once | INTRAVENOUS | Status: AC | PRN
  Administered 2018-10-21: 100 mL via INTRAVENOUS

## 2018-10-21 NOTE — ED Provider Notes (Addendum)
[facility] Emergency Department Provider Note  ____________________________________________   I have reviewed the triage vital signs and the nursing notes. Where available I have reviewed prior notes and, if possible and indicated, outside hospital notes.    HISTORY  Chief Complaint Chest Pain and Abdominal Pain    HPI Gregory Padilla is a 85 y.o. male at his baseline today, family state however that he has had episodes of confusion in the past, they also state that he is complaining of pain.  His pain is very difficult for him to describe.  Sometimes he states his abdominal pain, mostly however he states he has pain in the ribs in the back.  He denies any fall.  The initial son who is here also denies any fall or trauma.  Patient has no shortness of breath or vomiting or diarrhea.  No fever or chills.  They do think he may have eaten some peanut butter, and that can cause him to have abdominal discomfort.  His abdomen is not hurting him at this time.  He cannot tell me where his abdomen was hurting.  He states mostly his pain is actually in his upper back.  Level 5 chart caveat; no further history available due to patient status.   Past Medical History:  Diagnosis Date  . Arthritis   . BPH (benign prostatic hyperplasia)   . Carotid artery occlusion   . Coccidioidomycosis   . CVA (cerebral vascular accident) ([place])    left sided weakness  . Degenerative joint disease   . Diabetes mellitus age 63   no insulin  . Hyperlipidemia   . Hypertension   . Meniere disease   . Skin cancer    history of skin cancer  . Stroke [facility])     Patient Active Problem List   Diagnosis Date Noted  . CAP (community acquired pneumonia) 01/27/2015  . Sepsis ([place]) 01/27/2015  . Community acquired bacterial pneumonia 01/27/2015  . Stroke ([place])   . Arthritis   . Carotid artery occlusion   . CVA (cerebral vascular accident) ([place])   . Hyperlipidemia   . Hypertension   . BPH  (benign prostatic hyperplasia)   . Skin cancer   . Weakness   . Pain in limb-Right trunk 07/18/2013  . Occlusion and stenosis of carotid artery without mention of cerebral infarction 07/18/2013  . TIA (transient ischemic attack) 09/15/2011  . Diabetes mellitus without complication ([place]) 63/87/5643  . PERSONAL HISTORY OF COLONIC POLYPS 10/28/2009    Past Surgical History:  Procedure Laterality Date  . BACK SURGERY  2004  . CHOLECYSTECTOMY    . PROSTATE SURGERY     x2    Prior to Admission medications   Medication Sig Start Date End Date Taking? Authorizing Provider  acetaminophen (TYLENOL) 500 MG tablet Take 1,000 mg by mouth at bedtime. For fever     [provider]  ALPRAZolam (XANAX) 0.25 MG tablet Take 0.25 mg by mouth at bedtime as needed. For anxiety    [provider]  Ascorbic Acid (VITAMIN C PO) Take 1,000 mg by mouth daily.     [provider]  aspirin 81 MG tablet Take 81 mg by mouth daily.      [provider]  buPROPion [facility]) 100 MG tablet  09/06/17   [provider]  calcium citrate-vitamin D 200-200 MG-UNIT TABS Take 1 tablet by mouth 2 (two) times daily.      [provider]  clopidogrel (PLAVIX) 75 MG  tablet Take 75 mg by mouth daily.      [provider]  dicyclomine (BENTYL) 10 MG capsule Take 10 mg by mouth 3 (three) times daily as needed for spasms.    [provider]  diphenhydrAMINE (SOMINEX) 25 MG tablet Take 25 mg by mouth at bedtime as needed. For allergy    [provider]  donepezil (ARICEPT) 10 MG tablet Take by mouth. 01/25/16   [provider]  esomeprazole (NEXIUM) 40 MG capsule Take 40 mg by mouth daily before breakfast.      [provider]  ipratropium (ATROVENT) 0.03 % nasal spray Place into the nose.    [provider]  ketoconazole (NIZORAL) 200 MG tablet Take by mouth daily.    [provider]  Linaclotide Rolan Lipa) 145 MCG  CAPS capsule Take 1 capsule (145 mcg total) by mouth daily. 09/03/13   Milus Banister, MD  metFORMIN (GLUCOPHAGE) 500 MG tablet Take 1 tablet (500 mg total) by mouth 2 (two) times daily with a meal. 01/29/15   Ghimire, Henreitta Leber, MD  mirabegron ER (MYRBETRIQ) 50 MG TB24 tablet Take 50 mg by mouth at bedtime.    [provider]  Multiple Vitamin (MULTIVITAMIN WITH MINERALS) TABS tablet Take 1 tablet by mouth daily.    [provider]  Polyethyl Glycol-Propyl Glycol (SYSTANE OP) Place 1-2 drops into both eyes 2 (two) times daily.     [provider]  Polyethyl Glycol-Propyl Glycol 0.4-0.3 % GEL Apply 1 application to eye at bedtime.    [provider]  polyethylene glycol powder (MIRALAX) powder Take 17 g by mouth daily as needed for mild constipation.     [provider]  psyllium (METAMUCIL) 58.6 % powder Take 1 packet by mouth 3 (three) times daily.    [provider]  rosuvastatin (CRESTOR) 5 MG tablet Take 5 mg by mouth daily.      [provider]  tamsulosin (FLOMAX) 0.4 MG CAPS capsule Take 0.8 mg by mouth daily.    [provider]    Allergies Amitriptyline  Family History  Problem Relation Age of Onset  . Diabetes Mother   . Arthritis Mother   . Coronary artery disease Father   . Heart disease Father   . Stroke Neg Hx     Social History Social History   Tobacco Use  . Smoking status: Never Smoker  . Smokeless tobacco: Never Used  Substance Use Topics  . Alcohol use: No    Alcohol/week: 0.0 standard drinks  . Drug use: No    Review of Systems Constitutional: No fever/chills Eyes: No visual changes. ENT: No sore throat. No stiff neck no neck pain Cardiovascular: Denies chest pain. Respiratory: Denies shortness of breath. Gastrointestinal:   no vomiting.  No diarrhea.  No constipation. Genitourinary: Negative for dysuria. Musculoskeletal: Negative lower extremity swelling Skin: Negative for  rash. Neurological: Negative for severe headaches, focal weakness or numbness.   ____________________________________________   PHYSICAL EXAM:  VITAL SIGNS: ED Triage Vitals [10/21/18 1314]  Enc Vitals Group     BP (!) 147/79     Pulse Rate 61     Resp 16     Temp 98.2 F (36.8 C)     Temp Source Oral     SpO2 97 %     Weight 190 lb (86.2 kg)     Height 5\' 10"  (1.778 m)     Head Circumference      Peak Flow  Pain Score 1     Pain Loc      Pain Edu?      Excl. in [place]?     Constitutional: Alert and oriented name and place, unsure of the exact date. Well appearing and in no acute distress. Eyes: Conjunctivae are normal Head: Atraumatic HEENT: No congestion/rhinnorhea. Mucous membranes are moist.  Oropharynx non-erythematous Neck:   Nontender with no meningismus, no masses, no stridor Cardiovascular: Normal rate, regular rhythm. Grossly normal heart sounds.  Good peripheral circulation. Respiratory: Normal respiratory effort.  No retractions. Lungs CTAB. Abdominal: Soft and nontender. No distention. No guarding no rebound Back: he is very tender to palpation of his ribs and he also has discomfort when he moves around, this is in the mostly left back, upper mid, there is no crepitus or flail chest this does seem to reproduce his pain there is no midline tenderness there are no lesions noted. there is no CVA tenderness Musculoskeletal: No lower extremity tenderness, no upper extremity tenderness. No joint effusions, no DVT signs strong distal pulses no edema Neurologic:  Normal speech and language. No gross focal neurologic deficits are appreciated.  Skin:  Skin is warm, dry and intact. No rash noted. Psychiatric: Mood and affect are normal. Speech and behavior are normal.  ____________________________________________   LABS (all labs ordered are listed, but only abnormal results are displayed)  Labs Reviewed  COMPREHENSIVE METABOLIC PANEL - Abnormal; Notable for the  following components:      Result Value   Glucose, Bld 148 (*)    GFR calc non Af Amer 59 (*)    All other components within normal limits  CBC WITH DIFFERENTIAL/PLATELET - Abnormal; Notable for the following components:   WBC 14.0 (*)    RBC 3.99 (*)    Hemoglobin 12.4 (*)    HCT 37.3 (*)    Neutro Abs 10.0 (*)    Monocytes Absolute 2.0 (*)    All other components within normal limits  PROTIME-INR  LIPASE, BLOOD  TROPONIN I  URINALYSIS, COMPLETE (UACMP) WITH MICROSCOPIC    Pertinent labs  results that were available during my care of the patient were reviewed by me and considered in my medical decision making (see chart for details). ____________________________________________  EKG  I personally interpreted any EKGs ordered by me or triage Sinus bradycardia rate 59 LAFB, RBBB, no acute ST elevation or depression ____________________________________________  RADIOLOGY  Pertinent labs & imaging results that were available during my care of the patient were reviewed by me and considered in my medical decision making (see chart for details). If possible, patient and/or family made aware of any abnormal findings.  Dg Chest 2 View  Result Date: 10/21/2018 CLINICAL DATA:  Chest pain. EXAM: CHEST - 2 VIEW COMPARISON:  Radiograph of January 27, 2015. FINDINGS: The heart size and mediastinal contours are within normal limits. No pneumothorax or pleural effusion is noted. Both lungs are clear. The visualized skeletal structures are unremarkable. IMPRESSION: No active cardiopulmonary disease. Electronically Signed   By: Marijo Conception, M.D.   On: 10/21/2018 14:37   ____________________________________________    PROCEDURES  Procedure(s) performed: None  Procedures  Critical Care performed: None  ____________________________________________   INITIAL IMPRESSION / ASSESSMENT AND PLAN / ED COURSE  Pertinent labs & imaging results that were available during my care of the patient  were reviewed by me and considered in my medical decision making (see chart for details).  Patient with very clear describe but very reproducible back  pain, no known trauma.  Also had some complaints of abdominal pain but he seems to become what confused about whether is actually abdominal or back pain.  In any event, he is very reassuring work-up and his belly is benign.  I had extensive conversation with the family, I do think this is most likely a musculoskeletal issue in the back.  I did however given all the confusion about what exactly was hurting offered to do a CT scan to ensure that he is not having an aortic event, his blood pressure slightly elevated, he is also a little bit agitated about his difficulty to communicate.  I think this is partially why his pressures up but I think we should check it out and make sure that there is nothing else going on.  ----------------------------------------- 3:15 PM on 10/21/2018 -----------------------------------------  After discussing with the next son who arrives, they do state that he did have a traumatic event, 2 days ago he rolled over in the bed and was stuck on the bar of the bed in that exact area and it was very difficult for them to get him off of it.  He did not fall or hit his head, but he did have trauma aches to the exact part of his back where he is complaining about at this time.  And certainly where it is reproducible.  Therefore, I do believe this to be what it appears to be clinically is a reproducible back pain.  However, given all the confusion about whether is his back or his chest etc., family would like to go ahead with a CT scan to rule out other pathology which I am certainly happy to do because of the confusion about where exactly the patient's pain is located when he feels it.  However, there is does not seem to be any clinical confusion in my mind about what hurts when I touch  ----------------------------------------- 3:40 PM  on 10/21/2018 -----------------------------------------  Signed out to Dr. Joni Fears pending urinalysis and CT scan.   ____________________________________________   FINAL CLINICAL IMPRESSION(S) / ED DIAGNOSES  Final diagnoses:  None      This chart was dictated using voice recognition software.  Despite best efforts to proofread,  errors can occur which can change meaning.      Schuyler Amor, MD 10/21/18 1517    Schuyler Amor, MD 10/21/18 1540

## 2018-10-21 NOTE — ED Triage Notes (Signed)
Pt comes via [facility] from home with c/o CP and epigastric pain that radiates to the back. Pt states pain is 1/10 when not moving. Pt has hx of stomach problems. Pt reports pain is more like a pressure. Right lower quadrant is distended with EMS. Abcess on right arm is normal for him. Right BBB with EMS normal for pt also-seeing cardiologist.

## 2018-10-21 NOTE — ED Provider Notes (Signed)
-----------------------------------------   7:57 PM on 10/21/2018 ----------------------------------------- Results including labs, EKG, CT scan reviewed with the patient and family at bedside.  This appears to be a musculoskeletal strain or contusion related to the safety bar on the patient's bed.  Patient tolerating oral intake, stable for discharge home.  Notably, during my conversation with the patient and his family, his symptomatology continues to change both in location and duration of pain complaints.  He is very inconsistent, and I wonder if many of his symptoms are not related to his underlying cognitive and psychiatric disease.  I doubt ACS or other occult cardiovascular event.   Carrie Mew, MD 10/21/18 [phone]

## 2018-10-21 NOTE — Discharge Instructions (Addendum)
Your CT scan of the chest and abdomen today was okay. Please follow up with your doctor this week to continue monitoring your symptoms.

## 2018-10-21 NOTE — ED Notes (Signed)
Pt given crackers and PB with water.

## 2018-10-21 NOTE — ED Notes (Signed)
Patient transported to X-ray 

## 2018-11-09 ENCOUNTER — Ambulatory Visit: Payer: Medicare Other | Admitting: Family

## 2018-11-09 ENCOUNTER — Encounter (HOSPITAL_COMMUNITY): Payer: Medicare Other

## 2018-11-29 NOTE — Progress Notes (Signed)
HISTORY AND PHYSICAL     CC:  follow up. Requesting Provider:  Maryland Pink, MD  HPI: This is a 85 y.o. male here for follow up for carotid artery stenosis.   Pt was last seen December 2018.  He has hx of small left brain stroke in 2012 following a TURP procedure.  He has remote hx of right brain stroke around 2009 with mild left sided weakness.  Hx of TIA 2017 but was not evaluated. At his last visit he was asymptomatic and primary concern was generalized weakness and dizziness.  He has Meniere's dz.  He wears hearing aids.  He hears better out of his left ear.  When asked if the pt has ever had a shade come down over his eye causing temporary vision loss, he states that yesterday, he had a dark circle come down over his left eye from the top impairing his vision for a couple of hours and then it resolved.  He states this happened a couple of other times in the past month, but only lasted a few minutes.   He has not had these issues in his right eye.  He denies any hx of speech problems, facial droop, clumsiness, or recent hemiparesis.  He states about 8 years ago, he had some left sided weakness that improved.  He states he does get some pain on his left side when he sleeps on his left side.    He states he has some swelling in his left leg that is not new and has been present for quite some time.  He does c/o having cold hands and feet with his hands being worse than his feet.   He has been married almost 91 years and lives at home with his wife.  He gets around in a wheelchair.  He states his memory has just started to slack. (pt is very sharp).  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     The pt is on a statin for cholesterol management.  The pt is diabetic.   The pt is not on medication for hypertension.   Tobacco hx:  never The pt is on a daily aspirin. Other AC:  Plavix    Past Medical History:  Diagnosis Date  . Arthritis   . BPH (benign prostatic  hyperplasia)   . Carotid artery occlusion   . Coccidioidomycosis   . CVA (cerebral vascular accident) ([place])    left sided weakness  . Degenerative joint disease   . Diabetes mellitus age 3   no insulin  . Hyperlipidemia   . Hypertension   . Meniere disease   . Skin cancer    history of skin cancer  . Stroke [facility])     Past Surgical History:  Procedure Laterality Date  . BACK SURGERY  2004  . CHOLECYSTECTOMY    . PROSTATE SURGERY     x2    Allergies  Allergen Reactions  . Amitriptyline     unknown    Current Outpatient Medications  Medication Sig Dispense Refill  . acetaminophen (TYLENOL) 500 MG tablet Take 1,000 mg by mouth at bedtime. For fever     . ALPRAZolam (XANAX) 0.25 MG tablet Take 0.25 mg by mouth at bedtime as needed. For anxiety    . Ascorbic Acid (VITAMIN C PO) Take 1,000 mg by mouth daily.     Marland Kitchen aspirin 81 MG tablet Take 81 mg by mouth daily.      Marland Kitchen  buPROPion (WELLBUTRIN) 100 MG tablet     . calcium citrate-vitamin D 200-200 MG-UNIT TABS Take 1 tablet by mouth 2 (two) times daily.      . clopidogrel (PLAVIX) 75 MG tablet Take 75 mg by mouth daily.      Marland Kitchen dicyclomine (BENTYL) 10 MG capsule Take 10 mg by mouth 3 (three) times daily as needed for spasms.    . diphenhydrAMINE (SOMINEX) 25 MG tablet Take 25 mg by mouth at bedtime as needed. For allergy    . donepezil (ARICEPT) 10 MG tablet Take by mouth.    . esomeprazole (NEXIUM) 40 MG capsule Take 40 mg by mouth daily before breakfast.      . ipratropium (ATROVENT) 0.03 % nasal spray Place into the nose.    Marland Kitchen ketoconazole (NIZORAL) 200 MG tablet Take by mouth daily.    . Linaclotide (LINZESS) 145 MCG CAPS capsule Take 1 capsule (145 mcg total) by mouth daily. 120 capsule 0  . metFORMIN (GLUCOPHAGE) 500 MG tablet Take 1 tablet (500 mg total) by mouth 2 (two) times daily with a meal. 60 tablet 0  . mirabegron ER (MYRBETRIQ) 50 MG TB24 tablet Take 50 mg by mouth at bedtime.    . Multiple Vitamin (MULTIVITAMIN  WITH MINERALS) TABS tablet Take 1 tablet by mouth daily.    Vladimir Faster Glycol-Propyl Glycol (SYSTANE OP) Place 1-2 drops into both eyes 2 (two) times daily.     Vladimir Faster Glycol-Propyl Glycol 0.4-0.3 % GEL Apply 1 application to eye at bedtime.    . polyethylene glycol powder (MIRALAX) powder Take 17 g by mouth daily as needed for mild constipation.     . psyllium (METAMUCIL) 58.6 % powder Take 1 packet by mouth 3 (three) times daily.    . rosuvastatin (CRESTOR) 5 MG tablet Take 5 mg by mouth daily.      . tamsulosin (FLOMAX) 0.4 MG CAPS capsule Take 0.8 mg by mouth daily.     No current facility-administered medications for this visit.     Family History  Problem Relation Age of Onset  . Diabetes Mother   . Arthritis Mother   . Coronary artery disease Father   . Heart disease Father   . Stroke Neg Hx     Social History   Socioeconomic History  . Marital status: Married    Spouse name: Not on file  . Number of children: 2  . Years of education: Not on file  . Highest education level: Not on file  Occupational History  . Occupation: retired    Fish farm manager: RETIRED  Social Needs  . Financial resource strain: Not on file  . Food insecurity:    Worry: Not on file    Inability: Not on file  . Transportation needs:    Medical: Not on file    Non-medical: Not on file  Tobacco Use  . Smoking status: Never Smoker  . Smokeless tobacco: Never Used  Substance and Sexual Activity  . Alcohol use: No    Alcohol/week: 0.0 standard drinks  . Drug use: No  . Sexual activity: Not on file  Lifestyle  . Physical activity:    Days per week: Not on file    Minutes per session: Not on file  . Stress: Not on file  Relationships  . Social connections:    Talks on phone: Not on file    Gets together: Not on file    Attends religious service: Not on file    Active member  of club or organization: Not on file    Attends meetings of clubs or organizations: Not on file    Relationship  status: Not on file  . Intimate partner violence:    Fear of current or ex partner: Not on file    Emotionally abused: Not on file    Physically abused: Not on file    Forced sexual activity: Not on file  Other Topics Concern  . Not on file  Social History Narrative  . Not on file     REVIEW OF SYSTEMS:   [X]  denotes positive finding, [ ]  denotes negative finding Cardiac  Comments:  Chest pain or chest pressure:    Shortness of breath upon exertion:    Short of breath when lying flat:    Irregular heart rhythm:        Vascular    Pain in calf, thigh, or hip brought on by ambulation:    Pain in feet at night that wakes you up from your sleep:     Blood clot in your veins:    Leg swelling:  x       Pulmonary    Oxygen at home:    Productive cough:     Wheezing:         Neurologic    Sudden weakness in arms or legs:     Sudden numbness in arms or legs:     Sudden onset of difficulty speaking or slurred speech:    Temporary loss of vision in one eye:     Problems with dizziness:         Gastrointestinal    Blood in stool:     Vomited blood:         Genitourinary    Burning when urinating:     Blood in urine:        Psychiatric    Major depression:         Hematologic    Bleeding problems:    Problems with blood clotting too easily:        Skin    Rashes or ulcers:        Constitutional    Fever or chills:      PHYSICAL EXAMINATION:  Today's Vitals   11/30/18 1432  BP: 124/74  Pulse: 60  Resp: 16  Temp: 97.6 F (36.4 C)  TempSrc: Oral  SpO2: 98%   There is no height or weight on file to calculate BMI.   General:  WDWN in NAD; vital signs documented above Gait: Not observed HENT: WNL, normocephalic Pulmonary: normal non-labored breathing , without Rales, rhonchi,  wheezing Cardiac: regular HR, without  Murmurs, rubs or gallops; without carotid bruits Abdomen: soft, NT, no masses Skin: without rashes Vascular Exam/Pulses:  Right Left    Radial 2+ (normal) 2+ (normal)  Femoral Not palpated Not palpated  DP Unable to palpate  Unable to palpate   PT Unable to palpate  Unable to palpate    Extremities: without ischemic changes, without Gangrene , without cellulitis; without open wounds;  Musculoskeletal: no muscle wasting or atrophy  Neurologic: A&O X 3 Psychiatric:  The pt has Normal affect.   Non-Invasive Vascular Imaging:   Carotid Duplex on 11/30/2018: Right:  1-39% stenosis Left:  60-79% stenosis Vertebrals:  Bilateral vertebral arteries demonstrate antegrade flow. Subclavians: Right subclavian artery was not visualized. Normal flow              hemodynamics were seen in the left subclavian  artery.  Previous Carotid duplex on 09/14/17: Right: <40% stenosis Left:   40-59% stenosis Bilateral vertebral arteries are antegrade (normal).  Bilateral subclavian arteries are multiphasic (normal).  No significant change compared to the exams on 07/30/15 and 08/04/16.   ASSESSMENT/PLAN:: 85 y.o. male here for follow up carotid artery stenosis.   -pt has had increase in stenosis of the left ICA to about 70%.  He also has had probable amaurosis fugax ~ 3x in the past month with the episode yesterday lasting a couple of hours.  Pt duplex reviewed by Dr. Donzetta Matters and spoke to pt and his son about intervention.  He discussed that he could have an endarterectomy, but he would be at high risk given his age or possible carotid stent.  Also discussed medical management, but would be at high risk for stroke with no intervention.  Dr. Donzetta Matters discussed with pt and son about scheduling a CTA of neck to evaluate stenosis and return next Friday for discussion.  Pt is at risk for stroke and it was reiterated that should he develop these or other sx again, he needs to go immediately to the ER.   -continue asa/statin/plavix  Leontine Locket, PA-C [facility] [phone]  Clinic MD:  Donzetta Matters

## 2018-11-30 ENCOUNTER — Other Ambulatory Visit: Payer: Self-pay

## 2018-11-30 ENCOUNTER — Ambulatory Visit (HOSPITAL_COMMUNITY)
Admission: RE | Admit: 2018-11-30 | Discharge: 2018-11-30 | Disposition: A | Discharge status: Home / Self Care | Payer: Medicare Other | Source: Ambulatory Visit | Attending: Family | Admitting: Family

## 2018-11-30 ENCOUNTER — Encounter: Payer: Self-pay | Admitting: Physician Assistant

## 2018-11-30 ENCOUNTER — Ambulatory Visit (INDEPENDENT_AMBULATORY_CARE_PROVIDER_SITE_OTHER): Payer: Medicare Other | Admitting: Physician Assistant

## 2018-11-30 VITALS — BP 124/74 | HR 60 | Temp 97.6°F | Resp 16

## 2018-11-30 DIAGNOSIS — I6523 Occlusion and stenosis of bilateral carotid arteries: Secondary | ICD-10-CM

## 2018-11-30 DIAGNOSIS — I6522 Occlusion and stenosis of left carotid artery: Secondary | ICD-10-CM

## 2018-11-30 DIAGNOSIS — Z8673 Personal history of transient ischemic attack (TIA), and cerebral infarction without residual deficits: Secondary | ICD-10-CM | POA: Diagnosis not present

## 2018-12-05 ENCOUNTER — Ambulatory Visit
Admission: RE | Admit: 2018-12-05 | Discharge: 2018-12-05 | Disposition: A | Discharge status: Home / Self Care | Payer: Medicare Other | Source: Ambulatory Visit | Attending: Vascular Surgery | Admitting: Vascular Surgery

## 2018-12-05 DIAGNOSIS — I6523 Occlusion and stenosis of bilateral carotid arteries: Secondary | ICD-10-CM

## 2018-12-05 DIAGNOSIS — Z8673 Personal history of transient ischemic attack (TIA), and cerebral infarction without residual deficits: Secondary | ICD-10-CM

## 2018-12-05 MED ORDER — IOPAMIDOL (ISOVUE-370) INJECTION 76%
75.0000 mL | Freq: Once | INTRAVENOUS | Status: AC | PRN
  Administered 2018-12-05: 75 mL via INTRAVENOUS

## 2018-12-07 ENCOUNTER — Other Ambulatory Visit: Payer: Self-pay

## 2018-12-07 ENCOUNTER — Ambulatory Visit (INDEPENDENT_AMBULATORY_CARE_PROVIDER_SITE_OTHER): Payer: Medicare Other | Admitting: Vascular Surgery

## 2018-12-07 ENCOUNTER — Encounter: Payer: Self-pay | Admitting: *Deleted

## 2018-12-07 ENCOUNTER — Other Ambulatory Visit: Payer: Self-pay | Admitting: *Deleted

## 2018-12-07 ENCOUNTER — Encounter: Payer: Self-pay | Admitting: Vascular Surgery

## 2018-12-07 VITALS — BP 128/63 | HR 61 | Resp 18 | Ht 70.0 in | Wt 190.0 lb

## 2018-12-07 DIAGNOSIS — I6522 Occlusion and stenosis of left carotid artery: Secondary | ICD-10-CM

## 2018-12-07 NOTE — Progress Notes (Signed)
Patient ID: [name], male   DOB: 15-Nov-1924, 85 y.o.   MRN: [id number]  Reason for Consult: Follow-up   Referred by Gregory Pink, MD  Subjective:     HPI:  Gregory Padilla is a 85 y.o. male was evaluated last week with known history of left-sided stroke as well as a TIA in 2017 but was not evaluated at that time.  Does not take aspirin due to Mnire's disease but does take Plavix daily.  Was noted to have a shade come down on his eye prior to last visit.  Did not have any speech issue, facial droop, hemiparesis.  Has had an issue like this about 8 years ago.  Travels via wheelchair but is very active.  States that he had some memory issues of late.  Past Medical History:  Diagnosis Date  . Arthritis   . BPH (benign prostatic hyperplasia)   . Carotid artery occlusion   . Coccidioidomycosis   . [place]    left sided weakness  . Degenerative joint disease   . Diabetes mellitus age 32   no insulin  . Hyperlipidemia   . Hypertension   . Meniere disease   . Skin cancer    history of skin cancer  . Stroke [facility])    Family History  Problem Relation Age of Onset  . Diabetes Mother   . Arthritis Mother   . Coronary artery disease Father   . Heart disease Father   . Stroke Neg Hx    Past Surgical History:  Procedure Laterality Date  . BACK SURGERY  2004  . CHOLECYSTECTOMY    . PROSTATE SURGERY     x2    Short Social History:  Social History   Tobacco Use  . Smoking status: Never Smoker  . Smokeless tobacco: Never Used  Substance Use Topics  . Alcohol use: No    Alcohol/week: 0.0 standard drinks    Allergies  Allergen Reactions  . Amitriptyline     unknown    Current Outpatient Medications  Medication Sig Dispense Refill  . acetaminophen (TYLENOL) 500 MG tablet Take 1,000 mg by mouth at bedtime. For fever     . ALPRAZolam (XANAX) 0.25 MG tablet Take 0.25 mg by mouth at bedtime as needed. For anxiety    . Ascorbic  Acid (VITAMIN C PO) Take 1,000 mg by mouth daily.     Marland Kitchen aspirin 81 MG tablet Take 81 mg by mouth daily.      Marland Kitchen buPROPion (WELLBUTRIN) 100 MG tablet     . calcium citrate-vitamin D 200-200 MG-UNIT TABS Take 1 tablet by mouth 2 (two) times daily.      . clopidogrel (PLAVIX) 75 MG tablet Take 75 mg by mouth daily.      Marland Kitchen dicyclomine (BENTYL) 10 MG capsule Take 10 mg by mouth 3 (three) times daily as needed for spasms.    . diphenhydrAMINE (SOMINEX) 25 MG tablet Take 25 mg by mouth at bedtime as needed. For allergy    . donepezil (ARICEPT) 10 MG tablet Take by mouth.    . esomeprazole (NEXIUM) 40 MG capsule Take 40 mg by mouth daily before breakfast.      . ipratropium (ATROVENT) 0.03 % nasal spray Place into the nose.    Marland Kitchen ketoconazole (NIZORAL) 200 MG tablet Take by mouth daily.    . Linaclotide (LINZESS) 145 MCG CAPS capsule Take 1 capsule (145 mcg total) by mouth daily. 120 capsule 0  .  metFORMIN (GLUCOPHAGE) 500 MG tablet Take 1 tablet (500 mg total) by mouth 2 (two) times daily with a meal. 60 tablet 0  . mirabegron ER (MYRBETRIQ) 50 MG TB24 tablet Take 50 mg by mouth at bedtime.    . Multiple Vitamin (MULTIVITAMIN WITH MINERALS) TABS tablet Take 1 tablet by mouth daily.    Vladimir Faster Glycol-Propyl Glycol (SYSTANE OP) Place 1-2 drops into both eyes 2 (two) times daily.     Vladimir Faster Glycol-Propyl Glycol 0.4-0.3 % GEL Apply 1 application to eye at bedtime.    . polyethylene glycol powder (MIRALAX) powder Take 17 g by mouth daily as needed for mild constipation.     . psyllium (METAMUCIL) 58.6 % powder Take 1 packet by mouth 3 (three) times daily.    . rosuvastatin (CRESTOR) 5 MG tablet Take 5 mg by mouth daily.      . tamsulosin (FLOMAX) 0.4 MG CAPS capsule Take 0.8 mg by mouth daily.     No current facility-administered medications for this visit.     Review of Systems  Constitutional:  Constitutional negative. HENT: HENT negative.  Eyes: Positive for visual disturbance.     Respiratory: Positive for shortness of breath and wheezing.  Cardiovascular: Cardiovascular negative.  Musculoskeletal: Musculoskeletal negative.  Skin: Skin negative.  Neurological: Neurological negative. Hematologic: Hematologic/lymphatic negative.  Psychiatric: Psychiatric negative.        Objective:  Objective   Vitals:   12/07/18 0848 12/07/18 0850  BP: 121/68 128/63  Pulse: 61   Resp: 18   Weight: 190 lb (86.2 kg)   Height: 5\' 10"  (1.778 m)    Body mass index is 27.26 kg/m.  Physical Exam HENT:     Head: Normocephalic.  Cardiovascular:     Rate and Rhythm: Regular rhythm.     Pulses:          Radial pulses are 2+ on the right side and 2+ on the left side.  Pulmonary:     Effort: Pulmonary effort is normal.  Abdominal:     General: Abdomen is flat.     Palpations: Abdomen is soft.  Musculoskeletal:        General: No swelling.  Skin:    General: Skin is warm and dry.  Neurological:     General: No focal deficit present.     Mental Status: He is alert.  Psychiatric:        Mood and Affect: Mood normal.        Behavior: Behavior normal.        Thought Content: Thought content normal.        Judgment: Judgment normal.     Data: CTA IMPRESSION: Calcified and extensive soft plaque at the left carotid bifurcation and ICA bulb. Minimal diameter of the proximal ICA is 1 mm, consistent with an 80% stenosis.  No stenosis at the right carotid bifurcation.  Maximal diameter of the visualized descending aorta is 3.8 cm. Recommend annual imaging followup by CTA or MRA. This recommendation follows 2010 ACCF/AHA/AATS/ACR/ASA/SCA/SCAI/SIR/STS/SVM Guidelines for the Diagnosis and Management of Patients with Thoracic Aortic Disease. Circulation.2010; 121: M468-E321. Aortic aneurysm NOS (ICD10-I71.9)  30-50% stenosis of the right V4 segment of the vertebral artery.  I reviewed the imaging with the patient and his son which demonstrates tight stenosis of the  left internal carotid artery.     Assessment/Plan:     85 year old male presents for further evaluation of what appears to be a symptomatic left internal carotid artery lesion without significant  disease on the right.  He is a lifelong non-smoker with risk factors being hypertension.  Does take Plavix daily cannot take aspirin due to Mnire's disease but is on statin as well.  I discussed with him proceeding with carotid endarterectomy versus trans-carotid artery stenting at this time he does appear to be a candidate for stenting and we will plan for this in the very near future.  Meanwhile I discussed with him the signs and symptoms of stroke for which she would need emergent evaluation and they demonstrate good understanding.     Waynetta Sandy MD [facility]

## 2018-12-11 ENCOUNTER — Encounter (HOSPITAL_COMMUNITY): Payer: Self-pay | Admitting: *Deleted

## 2018-12-11 ENCOUNTER — Other Ambulatory Visit: Payer: Self-pay

## 2018-12-11 NOTE — Progress Notes (Signed)
Spoke with pt's son, Jori Moll for pre-op call. DPR on file. Pt is hard of hearing and son states can get things confused at times. Pt does not have a cardiac history except has had some mild strokes. Does not see a cardiologist. Pt is a type 2 diabetic. Last A1C was 7.7 on 10/17/18. Jori Moll states pt very rarely checks his blood sugar but does have a meter to do. Pt's diabetes is diet controlled. Instructed Ronald to have pt check his blood sugar the morning of surgery. If blood sugar is 70 or below, treat with 1/2 cup of clear juice (apple or cranberry) and recheck blood sugar 15 minutes after drinking juice. If blood sugar continues to be 70 or below, call the Short Stay department and ask to speak to a nurse. Jori Moll voiced understanding.

## 2018-12-12 NOTE — Anesthesia Preprocedure Evaluation (Addendum)
Anesthesia Evaluation  Patient identified by MRN, date of birth, ID band Patient awake    Reviewed: Allergy & Precautions, H&P , NPO status , Patient's Chart, lab work & pertinent test results  Airway Mallampati: I  TM Distance: >3 FB Neck ROM: Full    Dental no notable dental hx. (+) Teeth Intact, Dental Advisory Given   Pulmonary neg pulmonary ROS,    Pulmonary exam normal breath sounds clear to auscultation       Cardiovascular hypertension, Pt. on medications  Rhythm:Regular Rate:Normal + Systolic murmurs    Neuro/Psych Dementia TIACVA, No Residual Symptoms    GI/Hepatic negative GI ROS, Neg liver ROS,   Endo/Other  diabetes, Type 2, Oral Hypoglycemic Agents  Renal/GU negative Renal ROS  negative genitourinary   Musculoskeletal  (+) Arthritis , Osteoarthritis,    Abdominal   Peds  Hematology  (+) Blood dyscrasia, anemia ,   Anesthesia Other Findings   Reproductive/Obstetrics negative OB ROS                           Anesthesia Physical Anesthesia Plan  ASA: III  Anesthesia Plan: General   Post-op Pain Management:    Induction: Intravenous  PONV Risk Score and Plan: 3 and Ondansetron, Dexamethasone and Treatment may vary due to age or medical condition  Airway Management Planned: Oral ETT  Additional Equipment: Arterial line  Intra-op Plan:   Post-operative Plan: Extubation in OR  Informed Consent: I have reviewed the patients History and Physical, chart, labs and discussed the procedure including the risks, benefits and alternatives for the proposed anesthesia with the patient or authorized representative who has indicated his/her understanding and acceptance.     Dental advisory given  Plan Discussed with: CRNA  Anesthesia Plan Comments: (See PAT note by Karoline Caldwell, PA-C. Pt to have preop echo in short stay, cardiology and Dr. Donzetta Matters aware. Dr. Donzetta Matters to order stat echo  in AM.  )       Anesthesia Quick Evaluation

## 2018-12-12 NOTE — Progress Notes (Signed)
Anesthesia Chart Review: SAME DAY WORKUP   Case:  [id number] Date/Time:  12/13/18 0946   Procedure:  TRANSCAROTID ARTERY REVASCULARIZATION (Left )   Anesthesia type:  General   Pre-op diagnosis:  LEFT CAROTID STENOSIS   Location:  [place] OR ROOM 16 / [place] OR   Surgeon:  Waynetta Sandy, MD      DISCUSSION: 85 yo male never smoker. Pertinent hx includes DMII, CVA (residual L side weakness), HTN, Carotid artery stenosis, Meniere's, Moderate AS by echo 2016.  Pt had echo in 2016 that showed moderate AS with mean gradient of 20 mmHg. He has not had cardiac followup since that time. Discussed with Dr. Kalman Shan. Pt needs updated echo prior to surgery. Per Zigmund Daniel at [facility] the pt has symptomatic carotid stenosis and case felt by Dr. Donzetta Matters to be somwehat urgent. I called cardiology and have arranged for pt to have echo in short stay preoperatively. Per Trish with cardiology, echo read will be expedited. Pt has been made aware to arrive at 7:30.     Pt will need stat order tomorrow AM for echo to be done in short stay. I spoke with Dr. Donzetta Matters and he said he or one of [facility] PA will order.  Ability to proceed with surgery pending preop echo.   VS: There were no vitals taken for this visit.  PROVIDERS: Maryland Pink, MD is PCP  LABS: Will need DOS labs  Labs Reviewed - No data to display   IMAGES: CTA Neck 12/05/18: IMPRESSION: Calcified and extensive soft plaque at the left carotid bifurcation and ICA bulb. Minimal diameter of the proximal ICA is 1 mm, consistent with an 80% stenosis.  No stenosis at the right carotid bifurcation.  Maximal diameter of the visualized descending aorta is 3.8 cm. Recommend annual imaging followup by CTA or MRA. This recommendation follows 2010 ACCF/AHA/AATS/ACR/ASA/SCA/SCAI/SIR/STS/SVM Guidelines for the Diagnosis and Management of Patients with Thoracic Aortic Disease. Circulation.2010; 121: G315-V761. Aortic aneurysm NOS (ICD10-I71.9)  30-50% stenosis of the  right V4 segment of the vertebral artery.  EKG: 10/21/18: Sinus bradycardia. Rate 59. Right bundle branch block. Left anterior fascicular block. Moderate voltage criteria for LVH, may be normal variant. Cannot rule out Septal infarct , age undetermined  CV: TTE 01/28/2015: Study Conclusions  - Left ventricle: The cavity size was normal. Wall thickness was increased in a pattern of mild LVH. Systolic function was normal. The estimated ejection fraction was in the range of 60% to 65%. Wall motion was normal; there were no regional wall motion abnormalities. Doppler parameters are consistent with abnormal left ventricular relaxation (grade 1 diastolic dysfunction). - Aortic valve: There was moderate stenosis. There was mild regurgitation. Valve area (VTI): 1.08 cm^2. Valve area (Vmax): 0.94 cm^2. Valve area (Vmean): 0.97 cm^2. - Mitral valve: Mildly calcified annulus.  Past Medical History:  Diagnosis Date  . Anemia    while in the [facility] as a young man  . Arthritis   . BPH (benign prostatic hyperplasia)   . Carotid artery occlusion   . Coccidioidomycosis   . Constipation   . CVA (cerebral vascular accident) ([place])    left sided weakness  . Degenerative joint disease   . Diabetes mellitus age 39   no insulin  . Hyperlipidemia   . Hypertension   . Meniere disease   . Skin cancer    history of skin cancer  . Stroke [facility])     Past Surgical History:  Procedure Laterality Date  . BACK SURGERY  2004  .  CHOLECYSTECTOMY    . PROSTATE SURGERY     x2    MEDICATIONS: No current facility-administered medications for this encounter.    Marland Kitchen acetaminophen (TYLENOL) 500 MG tablet  . ALPRAZolam (XANAX) 0.25 MG tablet  . Ascorbic Acid (VITAMIN C PO)  . calcium citrate-vitamin D 200-200 MG-UNIT TABS  . clopidogrel (PLAVIX) 75 MG tablet  . donepezil (ARICEPT) 10 MG tablet  . DULoxetine (CYMBALTA) 30 MG capsule  . esomeprazole (NEXIUM) 40 MG capsule  . ipratropium  (ATROVENT) 0.06 % nasal spray  . Magnesium 250 MG TABS  . Melatonin 10 MG TABS  . mirabegron ER (MYRBETRIQ) 50 MG TB24 tablet  . Multiple Vitamin (MULTIVITAMIN WITH MINERALS) TABS tablet  . OVER THE COUNTER MEDICATION  . Polyethyl Glycol-Propyl Glycol (SYSTANE OP)  . Polyethyl Glycol-Propyl Glycol 0.4-0.3 % GEL  . polyethylene glycol powder (MIRALAX) powder  . Potassium 99 MG TABS  . psyllium (METAMUCIL) 58.6 % powder  . rosuvastatin (CRESTOR) 5 MG tablet  . traZODone (DESYREL) 50 MG tablet  . Linaclotide (LINZESS) 145 MCG CAPS capsule  . metFORMIN (GLUCOPHAGE) 500 MG tablet   Wynonia Musty [facility] Short Stay Center/Anesthesiology Phone [phone] 12/12/2018 10:21 AM

## 2018-12-13 ENCOUNTER — Other Ambulatory Visit: Payer: Self-pay

## 2018-12-13 ENCOUNTER — Inpatient Hospital Stay (HOSPITAL_COMMUNITY): Payer: Medicare Other | Admitting: Physician Assistant

## 2018-12-13 ENCOUNTER — Inpatient Hospital Stay (HOSPITAL_COMMUNITY)
Admission: AD | Admit: 2018-12-13 | Discharge: 2018-12-14 | DRG: 035 | Disposition: A | Discharge status: Home Health | Payer: Medicare Other | Attending: Vascular Surgery | Admitting: Vascular Surgery

## 2018-12-13 ENCOUNTER — Inpatient Hospital Stay (HOSPITAL_COMMUNITY): Payer: Medicare Other

## 2018-12-13 ENCOUNTER — Encounter (HOSPITAL_COMMUNITY)
Admission: AD | Disposition: A | Discharge status: Home Health | Payer: Self-pay | Source: Home / Self Care | Attending: Vascular Surgery

## 2018-12-13 ENCOUNTER — Encounter (HOSPITAL_COMMUNITY): Payer: Self-pay | Admitting: Registered Nurse

## 2018-12-13 DIAGNOSIS — Z7983 Long term (current) use of bisphosphonates: Secondary | ICD-10-CM | POA: Diagnosis not present

## 2018-12-13 DIAGNOSIS — N400 Enlarged prostate without lower urinary tract symptoms: Secondary | ICD-10-CM | POA: Diagnosis present

## 2018-12-13 DIAGNOSIS — Z79899 Other long term (current) drug therapy: Secondary | ICD-10-CM

## 2018-12-13 DIAGNOSIS — E119 Type 2 diabetes mellitus without complications: Secondary | ICD-10-CM | POA: Diagnosis present

## 2018-12-13 DIAGNOSIS — I10 Essential (primary) hypertension: Secondary | ICD-10-CM | POA: Diagnosis present

## 2018-12-13 DIAGNOSIS — Z01818 Encounter for other preprocedural examination: Secondary | ICD-10-CM

## 2018-12-13 DIAGNOSIS — I6522 Occlusion and stenosis of left carotid artery: Secondary | ICD-10-CM | POA: Diagnosis not present

## 2018-12-13 DIAGNOSIS — I69354 Hemiplegia and hemiparesis following cerebral infarction affecting left non-dominant side: Secondary | ICD-10-CM | POA: Diagnosis not present

## 2018-12-13 DIAGNOSIS — D649 Anemia, unspecified: Secondary | ICD-10-CM | POA: Diagnosis present

## 2018-12-13 DIAGNOSIS — Z7984 Long term (current) use of oral hypoglycemic drugs: Secondary | ICD-10-CM | POA: Diagnosis not present

## 2018-12-13 DIAGNOSIS — H547 Unspecified visual loss: Secondary | ICD-10-CM | POA: Diagnosis present

## 2018-12-13 DIAGNOSIS — I6529 Occlusion and stenosis of unspecified carotid artery: Secondary | ICD-10-CM | POA: Diagnosis present

## 2018-12-13 DIAGNOSIS — E785 Hyperlipidemia, unspecified: Secondary | ICD-10-CM | POA: Diagnosis present

## 2018-12-13 DIAGNOSIS — K5900 Constipation, unspecified: Secondary | ICD-10-CM | POA: Diagnosis present

## 2018-12-13 DIAGNOSIS — H8109 Meniere's disease, unspecified ear: Secondary | ICD-10-CM | POA: Diagnosis present

## 2018-12-13 DIAGNOSIS — Z85828 Personal history of other malignant neoplasm of skin: Secondary | ICD-10-CM

## 2018-12-13 HISTORY — DX: Anemia, unspecified: D64.9

## 2018-12-13 HISTORY — PX: TRANSCAROTID ARTERY REVASCULARIZATIONÂ: SHX6778

## 2018-12-13 HISTORY — DX: Constipation, unspecified: K59.00

## 2018-12-13 LAB — URINALYSIS, ROUTINE W REFLEX MICROSCOPIC
Bilirubin Urine: NEGATIVE
Glucose, UA: NEGATIVE mg/dL
Hgb urine dipstick: NEGATIVE
Ketones, ur: NEGATIVE mg/dL
Nitrite: NEGATIVE
Protein, ur: NEGATIVE mg/dL
SPECIFIC GRAVITY, URINE: 1.011 (ref 1.005–1.030)
pH: 7 (ref 5.0–8.0)

## 2018-12-13 LAB — ECHOCARDIOGRAM COMPLETE
Height: 70 in
Weight: 3039.98 oz

## 2018-12-13 LAB — CBC
HCT: 36.7 % — ABNORMAL LOW (ref 39.0–52.0)
Hemoglobin: 11.8 g/dL — ABNORMAL LOW (ref 13.0–17.0)
MCH: 30.4 pg (ref 26.0–34.0)
MCHC: 32.2 g/dL (ref 30.0–36.0)
MCV: 94.6 fL (ref 80.0–100.0)
Platelets: 213 10*3/uL (ref 150–400)
RBC: 3.88 MIL/uL — AB (ref 4.22–5.81)
RDW: 12.2 % (ref 11.5–15.5)
WBC: 8.5 10*3/uL (ref 4.0–10.5)
nRBC: 0 % (ref 0.0–0.2)

## 2018-12-13 LAB — TYPE AND SCREEN
ABO/RH(D): A NEG
ANTIBODY SCREEN: NEGATIVE

## 2018-12-13 LAB — COMPREHENSIVE METABOLIC PANEL
ALBUMIN: 3.8 g/dL (ref 3.5–5.0)
ALT: 18 U/L (ref 0–44)
AST: 22 U/L (ref 15–41)
Alkaline Phosphatase: 96 U/L (ref 38–126)
Anion gap: 14 (ref 5–15)
BUN: 11 mg/dL (ref 8–23)
CHLORIDE: 96 mmol/L — AB (ref 98–111)
CO2: 23 mmol/L (ref 22–32)
Calcium: 9.3 mg/dL (ref 8.9–10.3)
Creatinine, Ser: 1.23 mg/dL (ref 0.61–1.24)
GFR calc Af Amer: 59 mL/min — ABNORMAL LOW (ref >60)
GFR calc non Af Amer: 51 mL/min — ABNORMAL LOW (ref >60)
GLUCOSE: 136 mg/dL — AB (ref 70–99)
Potassium: 4 mmol/L (ref 3.5–5.1)
Sodium: 133 mmol/L — ABNORMAL LOW (ref 135–145)
Total Bilirubin: 0.7 mg/dL (ref 0.3–1.2)
Total Protein: 6.4 g/dL — ABNORMAL LOW (ref 6.5–8.1)

## 2018-12-13 LAB — GLUCOSE, CAPILLARY
Glucose-Capillary: 120 mg/dL — ABNORMAL HIGH (ref 70–99)
Glucose-Capillary: 165 mg/dL — ABNORMAL HIGH (ref 70–99)

## 2018-12-13 LAB — APTT: aPTT: 33 seconds (ref 24–36)

## 2018-12-13 LAB — PROTIME-INR
INR: 1 (ref 0.8–1.2)
Prothrombin Time: 13.2 seconds (ref 11.4–15.2)

## 2018-12-13 LAB — ABO/RH: ABO/RH(D): A NEG

## 2018-12-13 SURGERY — TRANSCAROTID ARTERY REVASCULARIZATION (TCAR)
Anesthesia: General | Laterality: Left

## 2018-12-13 MED ORDER — PHENOL 1.4 % MT LIQD: 1.0000 | OROMUCOSAL | Status: DC | PRN

## 2018-12-13 MED ORDER — PSYLLIUM 95 % PO PACK
1.0000 | PACK | Freq: Two times a day (BID) | ORAL | Status: DC
  Administered 2018-12-13 – 2018-12-14 (×2): 1 via ORAL
  Filled 2018-12-13 (×4): qty 1

## 2018-12-13 MED ORDER — POLYVINYL ALCOHOL 1.4 % OP SOLN: 1.0000 [drp] | Freq: Three times a day (TID) | OPHTHALMIC | Status: DC | PRN

## 2018-12-13 MED ORDER — PHENYLEPHRINE 40 MCG/ML (10ML) SYRINGE FOR IV PUSH (FOR BLOOD PRESSURE SUPPORT)
PREFILLED_SYRINGE | INTRAVENOUS | Status: AC
  Filled 2018-12-13: qty 10

## 2018-12-13 MED ORDER — CHLORHEXIDINE GLUCONATE CLOTH 2 % EX PADS: 6.0000 | MEDICATED_PAD | Freq: Once | CUTANEOUS | Status: DC

## 2018-12-13 MED ORDER — SUGAMMADEX SODIUM 200 MG/2ML IV SOLN
INTRAVENOUS | Status: DC | PRN
  Administered 2018-12-13: 200 mg via INTRAVENOUS
  Administered 2018-12-13: 50 mg via INTRAVENOUS

## 2018-12-13 MED ORDER — PROTAMINE SULFATE 10 MG/ML IV SOLN
INTRAVENOUS | Status: DC | PRN
  Administered 2018-12-13 (×2): 20 mg via INTRAVENOUS
  Administered 2018-12-13: 10 mg via INTRAVENOUS

## 2018-12-13 MED ORDER — HEMOSTATIC AGENTS (NO CHARGE) OPTIME
TOPICAL | Status: DC | PRN
  Administered 2018-12-13: 1 via TOPICAL

## 2018-12-13 MED ORDER — ENOXAPARIN SODIUM 30 MG/0.3ML ~~LOC~~ SOLN: 30.0000 mg | SUBCUTANEOUS | Status: DC

## 2018-12-13 MED ORDER — SODIUM CHLORIDE 0.9 % IV SOLN
INTRAVENOUS | Status: DC | PRN
  Administered 2018-12-13: 500 mL

## 2018-12-13 MED ORDER — HYDRALAZINE HCL 20 MG/ML IJ SOLN: 5.0000 mg | INTRAMUSCULAR | Status: DC | PRN

## 2018-12-13 MED ORDER — TRAMADOL HCL 50 MG PO TABS: 50.0000 mg | ORAL_TABLET | Freq: Four times a day (QID) | ORAL | Status: DC | PRN

## 2018-12-13 MED ORDER — DULOXETINE HCL 30 MG PO CPEP
30.0000 mg | ORAL_CAPSULE | Freq: Every day | ORAL | Status: DC
  Administered 2018-12-14: 30 mg via ORAL
  Filled 2018-12-13: qty 1

## 2018-12-13 MED ORDER — SODIUM CHLORIDE 0.9 % IV SOLN: 500.0000 mL | Freq: Once | INTRAVENOUS | Status: DC | PRN

## 2018-12-13 MED ORDER — ACETAMINOPHEN 325 MG RE SUPP: 325.0000 mg | RECTAL | Status: DC | PRN

## 2018-12-13 MED ORDER — LACTATED RINGERS IV SOLN
INTRAVENOUS | Status: DC | PRN
  Administered 2018-12-13: 10:00:00 via INTRAVENOUS

## 2018-12-13 MED ORDER — POLYETHYL GLYCOL-PROPYL GLYCOL 0.4-0.3 % OP GEL
1.0000 "application " | Freq: Every day | OPHTHALMIC | Status: DC
  Filled 2018-12-13: qty 10

## 2018-12-13 MED ORDER — LIDOCAINE 2% (20 MG/ML) 5 ML SYRINGE
INTRAMUSCULAR | Status: DC | PRN
  Administered 2018-12-13: 80 mg via INTRAVENOUS

## 2018-12-13 MED ORDER — HEPARIN SODIUM (PORCINE) 1000 UNIT/ML IJ SOLN
INTRAMUSCULAR | Status: DC | PRN
  Administered 2018-12-13: 9000 [IU] via INTRAVENOUS

## 2018-12-13 MED ORDER — IODIXANOL 320 MG/ML IV SOLN
INTRAVENOUS | Status: DC | PRN
  Administered 2018-12-13: 50 mL via INTRA_ARTERIAL

## 2018-12-13 MED ORDER — DEXAMETHASONE SODIUM PHOSPHATE 10 MG/ML IJ SOLN
INTRAMUSCULAR | Status: AC
  Filled 2018-12-13: qty 1

## 2018-12-13 MED ORDER — PROPOFOL 10 MG/ML IV BOLUS
INTRAVENOUS | Status: AC
  Filled 2018-12-13: qty 20

## 2018-12-13 MED ORDER — POLYETHYLENE GLYCOL 3350 17 G PO PACK
17.0000 g | PACK | Freq: Every day | ORAL | Status: DC
  Administered 2018-12-13 – 2018-12-14 (×2): 17 g via ORAL
  Filled 2018-12-13 (×2): qty 1

## 2018-12-13 MED ORDER — ACETAMINOPHEN 500 MG PO TABS
ORAL_TABLET | ORAL | Status: AC
  Filled 2018-12-13: qty 2

## 2018-12-13 MED ORDER — CLOPIDOGREL BISULFATE 75 MG PO TABS
75.0000 mg | ORAL_TABLET | Freq: Every day | ORAL | Status: DC
  Administered 2018-12-14: 75 mg via ORAL
  Filled 2018-12-13: qty 1

## 2018-12-13 MED ORDER — CEFAZOLIN SODIUM-DEXTROSE 2-4 GM/100ML-% IV SOLN
2.0000 g | INTRAVENOUS | Status: AC
  Administered 2018-12-13: 2 g via INTRAVENOUS
  Filled 2018-12-13: qty 100

## 2018-12-13 MED ORDER — LACTATED RINGERS IV SOLN
INTRAVENOUS | Status: DC
  Administered 2018-12-13: 11:00:00 via INTRAVENOUS

## 2018-12-13 MED ORDER — DEXAMETHASONE SODIUM PHOSPHATE 10 MG/ML IJ SOLN
INTRAMUSCULAR | Status: DC | PRN
  Administered 2018-12-13: 10 mg via INTRAVENOUS

## 2018-12-13 MED ORDER — ALUM & MAG HYDROXIDE-SIMETH 200-200-20 MG/5ML PO SUSP: 15.0000 mL | ORAL | Status: DC | PRN

## 2018-12-13 MED ORDER — ONDANSETRON HCL 4 MG/2ML IJ SOLN: 4.0000 mg | Freq: Four times a day (QID) | INTRAMUSCULAR | Status: DC | PRN

## 2018-12-13 MED ORDER — PHENYLEPHRINE 40 MCG/ML (10ML) SYRINGE FOR IV PUSH (FOR BLOOD PRESSURE SUPPORT)
PREFILLED_SYRINGE | INTRAVENOUS | Status: DC | PRN
  Administered 2018-12-13: 80 ug via INTRAVENOUS

## 2018-12-13 MED ORDER — SENNOSIDES-DOCUSATE SODIUM 8.6-50 MG PO TABS: 1.0000 | ORAL_TABLET | Freq: Every evening | ORAL | Status: DC | PRN

## 2018-12-13 MED ORDER — GUAIFENESIN-DM 100-10 MG/5ML PO SYRP: 15.0000 mL | ORAL_SOLUTION | ORAL | Status: DC | PRN

## 2018-12-13 MED ORDER — ROCURONIUM BROMIDE 50 MG/5ML IV SOSY
PREFILLED_SYRINGE | INTRAVENOUS | Status: AC
  Filled 2018-12-13: qty 5

## 2018-12-13 MED ORDER — FENTANYL CITRATE (PF) 250 MCG/5ML IJ SOLN
INTRAMUSCULAR | Status: AC
  Filled 2018-12-13: qty 5

## 2018-12-13 MED ORDER — SODIUM CHLORIDE 0.9 % IV SOLN: INTRAVENOUS | Status: DC

## 2018-12-13 MED ORDER — PANTOPRAZOLE SODIUM 40 MG PO TBEC: 80.0000 mg | DELAYED_RELEASE_TABLET | Freq: Every day | ORAL | Status: DC

## 2018-12-13 MED ORDER — CEFAZOLIN SODIUM-DEXTROSE 2-4 GM/100ML-% IV SOLN
2.0000 g | Freq: Three times a day (TID) | INTRAVENOUS | Status: AC
  Administered 2018-12-13 – 2018-12-14 (×2): 2 g via INTRAVENOUS
  Filled 2018-12-13 (×2): qty 100

## 2018-12-13 MED ORDER — MIRABEGRON ER 50 MG PO TB24
50.0000 mg | ORAL_TABLET | Freq: Two times a day (BID) | ORAL | Status: DC
  Administered 2018-12-13 – 2018-12-14 (×2): 50 mg via ORAL
  Filled 2018-12-13 (×2): qty 1

## 2018-12-13 MED ORDER — POLYETHYLENE GLYCOL 3350 17 GM/SCOOP PO POWD
17.0000 g | Freq: Every day | ORAL | Status: DC
  Filled 2018-12-13: qty 255

## 2018-12-13 MED ORDER — POTASSIUM 99 MG PO TABS: 99.0000 mg | ORAL_TABLET | Freq: Every day | ORAL | Status: DC

## 2018-12-13 MED ORDER — BISACODYL 5 MG PO TBEC: 5.0000 mg | DELAYED_RELEASE_TABLET | Freq: Every day | ORAL | Status: DC | PRN

## 2018-12-13 MED ORDER — SODIUM CHLORIDE 0.9 % IV SOLN
INTRAVENOUS | Status: AC
  Filled 2018-12-13: qty 1.2

## 2018-12-13 MED ORDER — ALPRAZOLAM 0.25 MG PO TABS: 0.2500 mg | ORAL_TABLET | Freq: Every evening | ORAL | Status: DC | PRN

## 2018-12-13 MED ORDER — 0.9 % SODIUM CHLORIDE (POUR BTL) OPTIME
TOPICAL | Status: DC | PRN
  Administered 2018-12-13: 1000 mL

## 2018-12-13 MED ORDER — ONDANSETRON HCL 4 MG/2ML IJ SOLN
INTRAMUSCULAR | Status: AC
  Filled 2018-12-13: qty 2

## 2018-12-13 MED ORDER — LABETALOL HCL 5 MG/ML IV SOLN: 10.0000 mg | INTRAVENOUS | Status: DC | PRN

## 2018-12-13 MED ORDER — EPHEDRINE SULFATE-NACL 50-0.9 MG/10ML-% IV SOSY
PREFILLED_SYRINGE | INTRAVENOUS | Status: DC | PRN
  Administered 2018-12-13 (×2): 5 mg via INTRAVENOUS
  Administered 2018-12-13: 10 mg via INTRAVENOUS
  Administered 2018-12-13 (×2): 5 mg via INTRAVENOUS
  Administered 2018-12-13 (×3): 10 mg via INTRAVENOUS

## 2018-12-13 MED ORDER — POLYVINYL ALCOHOL 1.4 % OP SOLN
1.0000 [drp] | Freq: Every day | OPHTHALMIC | Status: DC
  Filled 2018-12-13: qty 15

## 2018-12-13 MED ORDER — IPRATROPIUM BROMIDE 0.06 % NA SOLN
1.0000 | Freq: Two times a day (BID) | NASAL | Status: DC | PRN
  Filled 2018-12-13: qty 15

## 2018-12-13 MED ORDER — FENTANYL CITRATE (PF) 250 MCG/5ML IJ SOLN
INTRAMUSCULAR | Status: DC | PRN
  Administered 2018-12-13 (×6): 25 ug via INTRAVENOUS
  Administered 2018-12-13 (×2): 50 ug via INTRAVENOUS

## 2018-12-13 MED ORDER — POTASSIUM CHLORIDE CRYS ER 20 MEQ PO TBCR: 20.0000 meq | EXTENDED_RELEASE_TABLET | Freq: Every day | ORAL | Status: DC | PRN

## 2018-12-13 MED ORDER — GLYCOPYRROLATE 0.2 MG/ML IJ SOLN
INTRAMUSCULAR | Status: DC | PRN
  Administered 2018-12-13: .1 mg via INTRAVENOUS
  Administered 2018-12-13: 0.2 mg via INTRAVENOUS
  Administered 2018-12-13: .1 mg via INTRAVENOUS

## 2018-12-13 MED ORDER — ROCURONIUM BROMIDE 10 MG/ML (PF) SYRINGE
PREFILLED_SYRINGE | INTRAVENOUS | Status: DC | PRN
  Administered 2018-12-13: 20 mg via INTRAVENOUS
  Administered 2018-12-13: 50 mg via INTRAVENOUS

## 2018-12-13 MED ORDER — LIDOCAINE 2% (20 MG/ML) 5 ML SYRINGE
INTRAMUSCULAR | Status: AC
  Filled 2018-12-13: qty 5

## 2018-12-13 MED ORDER — POLYETHYL GLYCOL-PROPYL GLYCOL 0.4-0.3 % OP GEL
Freq: Three times a day (TID) | OPHTHALMIC | Status: DC | PRN
  Filled 2018-12-13: qty 10

## 2018-12-13 MED ORDER — ACETAMINOPHEN 325 MG PO TABS: 325.0000 mg | ORAL_TABLET | ORAL | Status: DC | PRN

## 2018-12-13 MED ORDER — ONDANSETRON HCL 4 MG/2ML IJ SOLN
INTRAMUSCULAR | Status: DC | PRN
  Administered 2018-12-13: 4 mg via INTRAVENOUS

## 2018-12-13 MED ORDER — MAGNESIUM SULFATE 2 GM/50ML IV SOLN: 2.0000 g | Freq: Every day | INTRAVENOUS | Status: DC | PRN

## 2018-12-13 MED ORDER — METOPROLOL TARTRATE 5 MG/5ML IV SOLN: 2.0000 mg | INTRAVENOUS | Status: DC | PRN

## 2018-12-13 MED ORDER — ACETAMINOPHEN 500 MG PO TABS
1000.0000 mg | ORAL_TABLET | Freq: Once | ORAL | Status: AC
  Administered 2018-12-13: 1000 mg via ORAL

## 2018-12-13 MED ORDER — CLEVIDIPINE 50 MG/100ML IV EMUL
1.0000 mg/h | INTRAVENOUS | Status: AC
  Administered 2018-12-13: 2 mg/h via INTRAVENOUS
  Filled 2018-12-13: qty 100

## 2018-12-13 MED ORDER — HYDROMORPHONE HCL 1 MG/ML IJ SOLN: 0.5000 mg | INTRAMUSCULAR | Status: DC | PRN

## 2018-12-13 MED ORDER — DONEPEZIL HCL 5 MG PO TABS
10.0000 mg | ORAL_TABLET | Freq: Every day | ORAL | Status: DC
  Administered 2018-12-13: 10 mg via ORAL
  Filled 2018-12-13: qty 2

## 2018-12-13 MED ORDER — PROPOFOL 10 MG/ML IV BOLUS
INTRAVENOUS | Status: DC | PRN
  Administered 2018-12-13: 30 mg via INTRAVENOUS
  Administered 2018-12-13: 100 mg via INTRAVENOUS
  Administered 2018-12-13: 50 mg via INTRAVENOUS

## 2018-12-13 MED ORDER — ADULT MULTIVITAMIN W/MINERALS CH
1.0000 | ORAL_TABLET | Freq: Every day | ORAL | Status: DC
  Administered 2018-12-14: 1 via ORAL
  Filled 2018-12-13: qty 1

## 2018-12-13 MED ORDER — TRAZODONE HCL 50 MG PO TABS
50.0000 mg | ORAL_TABLET | Freq: Every day | ORAL | Status: DC
  Administered 2018-12-13: 50 mg via ORAL
  Filled 2018-12-13: qty 1

## 2018-12-13 MED ORDER — EPHEDRINE 5 MG/ML INJ
INTRAVENOUS | Status: AC
  Filled 2018-12-13: qty 10

## 2018-12-13 MED ORDER — DOCUSATE SODIUM 100 MG PO CAPS
100.0000 mg | ORAL_CAPSULE | Freq: Every day | ORAL | Status: DC
  Administered 2018-12-14: 100 mg via ORAL
  Filled 2018-12-13: qty 1

## 2018-12-13 MED ORDER — ROSUVASTATIN CALCIUM 5 MG PO TABS
5.0000 mg | ORAL_TABLET | Freq: Every day | ORAL | Status: DC
  Administered 2018-12-14: 5 mg via ORAL
  Filled 2018-12-13: qty 1

## 2018-12-13 SURGICAL SUPPLY — 73 items
ADH SKN CLS APL DERMABOND .7 (GAUZE/BANDAGES/DRESSINGS) ×2
ADPR TBG 2 MALE LL ART (MISCELLANEOUS)
BAG BANDED W/RUBBER/TAPE 36X54 (MISCELLANEOUS) ×3 IMPLANT
BAG EQP BAND 135X91 W/RBR TAPE (MISCELLANEOUS) ×1
CANISTER SUCT 3000ML PPV (MISCELLANEOUS) ×3 IMPLANT
CATH ROBINSON RED A/P 18FR (CATHETERS) IMPLANT
CLIP VESOCCLUDE MED 6/CT (CLIP) ×3 IMPLANT
CLIP VESOCCLUDE SM WIDE 6/CT (CLIP) ×3 IMPLANT
COVER DOME SNAP 22 D (MISCELLANEOUS) ×3 IMPLANT
COVER PROBE W GEL 5X96 (DRAPES) ×3 IMPLANT
COVER WAND RF STERILE (DRAPES) ×3 IMPLANT
CRADLE DONUT ADULT HEAD (MISCELLANEOUS) ×3 IMPLANT
DERMABOND ADVANCED (GAUZE/BANDAGES/DRESSINGS) ×4
DERMABOND ADVANCED .7 DNX12 (GAUZE/BANDAGES/DRESSINGS) ×1 IMPLANT
DRAIN CHANNEL 15F RND FF W/TCR (WOUND CARE) IMPLANT
DRAPE INCISE IOBAN 66X45 STRL (DRAPES) ×6 IMPLANT
DRAPE UNIVERSAL PACK (DRAPES) ×3 IMPLANT
ELECT REM PT RETURN 9FT ADLT (ELECTROSURGICAL) ×3
ELECTRODE REM PT RTRN 9FT ADLT (ELECTROSURGICAL) ×1 IMPLANT
EVACUATOR SILICONE 100CC (DRAIN) IMPLANT
GLOVE BIO SURGEON STRL SZ7.5 (GLOVE) ×3 IMPLANT
GOWN STRL REUS W/ TWL LRG LVL3 (GOWN DISPOSABLE) ×2 IMPLANT
GOWN STRL REUS W/ TWL XL LVL3 (GOWN DISPOSABLE) ×1 IMPLANT
GOWN STRL REUS W/TWL LRG LVL3 (GOWN DISPOSABLE) ×6
GOWN STRL REUS W/TWL XL LVL3 (GOWN DISPOSABLE) ×3
GUIDEWIRE ENROUTE 0.014 (WIRE) ×2 IMPLANT
HEMOSTAT SNOW SURGICEL 2X4 (HEMOSTASIS) IMPLANT
INSERT FOGARTY SM (MISCELLANEOUS) IMPLANT
INTRODUCER KIT GALT 7CM (INTRODUCER) ×3
IV ADAPTER SYR DOUBLE MALE LL (MISCELLANEOUS) IMPLANT
KIT BASIN OR (CUSTOM PROCEDURE TRAY) ×3 IMPLANT
KIT ENCORE 26 ADVANTAGE (KITS) ×3 IMPLANT
KIT INTRODUCER GALT 7 (INTRODUCER) ×1 IMPLANT
KIT TURNOVER KIT B (KITS) ×3 IMPLANT
NDL HYPO 25GX1X1/2 BEV (NEEDLE) IMPLANT
NDL PERC 18GX7CM (NEEDLE) ×1 IMPLANT
NDL SPNL 20GX3.5 QUINCKE YW (NEEDLE) IMPLANT
NEEDLE HYPO 25GX1X1/2 BEV (NEEDLE) IMPLANT
NEEDLE PERC 18GX7CM (NEEDLE) IMPLANT
NEEDLE SPNL 20GX3.5 QUINCKE YW (NEEDLE) IMPLANT
PACK CAROTID (CUSTOM PROCEDURE TRAY) ×3 IMPLANT
PAD ARMBOARD 7.5X6 YLW CONV (MISCELLANEOUS) ×6 IMPLANT
PROTECTION STATION PRESSURIZED (MISCELLANEOUS)
SET MICROPUNCTURE 5F STIFF (MISCELLANEOUS) ×3 IMPLANT
SHEATH AVANTI 11CM 5FR (SHEATH) IMPLANT
SHUNT CAROTID BYPASS 10 (VASCULAR PRODUCTS) IMPLANT
SHUNT CAROTID BYPASS 12FRX15.5 (VASCULAR PRODUCTS) IMPLANT
STATION PROTECTION PRESSURIZED (MISCELLANEOUS) ×1 IMPLANT
STENT TRANSCAROTID SYSTEM 8X40 (Permanent Stent) ×2 IMPLANT
STOPCOCK 4 WAY LG BORE MALE ST (IV SETS) ×3 IMPLANT
SUT ETHILON 3 0 PS 1 (SUTURE) IMPLANT
SUT MNCRL AB 4-0 PS2 18 (SUTURE) ×3 IMPLANT
SUT PROLENE 5 0 C 1 24 (SUTURE) ×5 IMPLANT
SUT PROLENE 6 0 BV (SUTURE) IMPLANT
SUT PROLENE 7 0 BV 1 (SUTURE) IMPLANT
SUT SILK 2 0 PERMA HAND 18 BK (SUTURE) ×1 IMPLANT
SUT SILK 2 0 SH CR/8 (SUTURE) ×3 IMPLANT
SUT SILK 3 0 (SUTURE)
SUT SILK 3-0 18XBRD TIE 12 (SUTURE) IMPLANT
SUT VIC AB 3-0 SH 27 (SUTURE) ×3
SUT VIC AB 3-0 SH 27X BRD (SUTURE) ×1 IMPLANT
SYR 10ML LL (SYRINGE) ×9 IMPLANT
SYR 20CC LL (SYRINGE) ×3 IMPLANT
SYR 5ML LL (SYRINGE) ×3 IMPLANT
SYR CONTROL 10ML LL (SYRINGE) IMPLANT
SYSTEM TRANSCAROTID NEUROPRTCT (MISCELLANEOUS) IMPLANT
TOWEL GREEN STERILE (TOWEL DISPOSABLE) ×3 IMPLANT
TRANSCAROTID NEUROPROTECT SYS (MISCELLANEOUS) ×3
TUBING ART PRESS 48 MALE/FEM (TUBING) IMPLANT
TUBING EXTENTION W/L.L. (IV SETS) IMPLANT
WATER STERILE IRR 1000ML POUR (IV SOLUTION) ×3 IMPLANT
WIRE AMPLATZ SS-J .035X180CM (WIRE) IMPLANT
WIRE BENTSON .035X145CM (WIRE) ×5 IMPLANT

## 2018-12-13 NOTE — OR Nursing (Deleted)
Pt. Was NI at pre-op assessment but did state that he had weakness/ gait problems with his left lower extremity. Sons present denied ever hearing complaints of this.

## 2018-12-13 NOTE — Discharge Instructions (Signed)
   [facility] [place] [facility]  Discharge Instructions   Carotid Endarterectomy (CEA)  Please refer to the following instructions for your post-procedure care. Your surgeon or physician assistant will discuss any changes with you.  Activity  You are encouraged to walk as much as you can. You can slowly return to normal activities but must avoid strenuous activity and heavy lifting until your doctor tell you it's OK. Avoid activities such as vacuuming or swinging a golf club. You can drive after one week if you are comfortable and you are no longer taking prescription pain medications. It is normal to feel tired for serval weeks after your surgery. It is also normal to have difficulty with sleep habits, eating, and bowel movements after surgery. These will go away with time.  Bathing/Showering  You may shower after you come home. Do not soak in a bathtub, hot tub, or swim until the incision heals completely.  Incision Care  Shower every day. Clean your incision with mild soap and water. Pat the area dry with a clean towel. You do not need a bandage unless otherwise instructed. Do not apply any ointments or creams to your incision. You may have skin glue on your incision. Do not peel it off. It will come off on its own in about one week. Your incision may feel thickened and raised for several weeks after your surgery. This is normal and the skin will soften over time. For Men Only: It's OK to shave around the incision but do not shave the incision itself for 2 weeks. It is common to have numbness under your chin that could last for several months.  Diet  Resume your normal diet. There are no special food restrictions following this procedure. A low fat/low cholesterol diet is recommended for all patients with vascular disease. In order to heal from your surgery, it is CRITICAL to get adequate nutrition. Your body requires vitamins, minerals, and protein. Vegetables are the best  source of vitamins and minerals. Vegetables also provide the perfect balance of protein. Processed food has little nutritional value, so try to avoid this.        Medications  Resume taking all of your medications unless your doctor or physician assistant tells you not to. If your incision is causing pain, you may take over-the- counter pain relievers such as acetaminophen (Tylenol). If you were prescribed a stronger pain medication, please be aware these medications can cause nausea and constipation. Prevent nausea by taking the medication with a snack or meal. Avoid constipation by drinking plenty of fluids and eating foods with a high amount of fiber, such as fruits, vegetables, and grains. Do not take Tylenol if you are taking prescription pain medications.  Follow Up  Our office will schedule a follow up appointment 2-3 weeks following discharge.  Please call us immediately for any of the following conditions  Increased pain, redness, drainage (pus) from your incision site. Fever of 101 degrees or higher. If you should develop stroke (slurred speech, difficulty swallowing, weakness on one side of your body, loss of vision) you should call 911 and go to the nearest emergency room.  Reduce your risk of vascular disease:  Stop smoking. If you would like help call [facility] at 1-800-QUIT-NOW (1-800-784-8669) or [facility] at 336-586-4000. Manage your cholesterol Maintain a desired weight Control your diabetes Keep your blood pressure down  If you have any questions, please call the office at [phone].   

## 2018-12-13 NOTE — Op Note (Signed)
Patient name: Gregory Padilla MRN: [id number] DOB: [birth year] Sex: male  12/13/2018 Pre-operative Diagnosis: Symptomatic high-grade left internal carotid artery stenosis Post-operative diagnosis:  Same Surgeon:  Erlene Quan C. Donzetta Matters, MD Procedure Performed: Trans-carotid artery left ICA stent with 8 x 50mm en route stent and flow reversal  Indications: 85 year old male with history of recent amaurosis found to have high-grade left ICA stenosis.  By CTA there is minimal calcification he is indicated for stenting.  We have discussed risk benefits and alternatives and he agrees to proceed.  Findings: There was high-grade stenosis greater than 80% at the level of the ICA bifurcation.  At completion there is no residual stenosis.  After additional stenting we did have a waist of the stent we did postdilated with 6 x 30 mm balloon.  Patient was neurologically intact upon awakening from anesthesia.   Procedure:  The patient was identified in the holding area and taken to the operating room or is placed supine operative general anesthesia induced.  He sterilely prepped and draped in the left neck and chest bilateral groin areas given antibiotics and timeout called.  We began with ultrasound-guided evaluation left common carotid artery.  Longitudinal incision was made between the 2 heads of the sternocleidomastoid dissected down to the IJ reflected this laterally until the common carotid was exposed.  Vagus was protected.  I encircled the common carotid artery with Vesseloops as well as umbilical tape.  Patient was fully heparinized at this time ACT was greater than 300 on check.  This time we turned our attention to left groin.  Ultrasound was used to evaluate the left common femoral vein was cannulated micropuncture needle followed by wire sheath.  Placed a Bentson wire followed by the 8 [place] sheath.  This was sutured to the skin.  I then placed a figure-of-eight suture in the common carotid artery with 5-0  Prolene suture.  I cannulated the artery with micropuncture needle followed by wire and sheath.  Angiography was performed which demonstrated our lesion.  I then directed our micropuncture wire into our external carotid artery and got our sheath level.  Amplatz wire was then placed in the external carotid artery and the 8 French sheath was placed.  Additional angiography demonstrated there is no dissection from our sheath placement.  TCAR timeout was undertaken pressure was raised.  We hooked up to reversal flow and confirmed first passive followed by active reversal.  The common carotid artery was then clamped.  I used a balloon as well as a 014 wire to cross the lesion.  Angiography confirmed we were in the ICA.  This was predilated with a 5 x 30 mm balloon followed by placement of our 8 x 40 stent.  There was a significant waist there so we did balloon this with 6 x 30 balloon.  Completion angiography demonstrated no additional waist.  We did allow 2 minutes prior to performing additional angiography views.  We then removed our wire clamp was removed.  Total clamp time 10 minutes.  We disconnected our flow reversal returned the blood in the groin.  Groin sheath was removed pressure held for 10 minutes.  Common carotid artery sheath then removed and 5-0 suture cinched.  50 mg of protamine was administered.  Wound was irrigated closed in layers with Vicryl Monocryl.  He was then awakened anesthesia having tolerated procedure well.  He was noted be neurologically intact transferred to the PACU in stable condition.  EBL: 50 cc  Common carotid clamp  time: 10:43   Jordanne Elsbury C. Donzetta Matters, MD [facility] [place] Office: [phone] Pager: [phone]

## 2018-12-13 NOTE — Anesthesia Postprocedure Evaluation (Signed)
Anesthesia Post Note  Patient: Gregory Padilla  Procedure(s) Performed: TRANSCAROTID ARTERY REVASCULARIZATION (Left )     Patient location during evaluation: PACU Anesthesia Type: General Level of consciousness: awake and alert Pain management: pain level controlled Vital Signs Assessment: post-procedure vital signs reviewed and stable Respiratory status: spontaneous breathing, nonlabored ventilation and respiratory function stable Cardiovascular status: blood pressure returned to baseline and stable Postop Assessment: no apparent nausea or vomiting Anesthetic complications: no    Last Vitals:  Vitals:   12/13/18 1330 12/13/18 1400  BP: (!) 123/54 (!) 125/52  Pulse: 61 (!) 54  Resp: 11 13  Temp: (!) 36.3 C   SpO2: 96% 96%    Last Pain:  Vitals:   12/13/18 1330  TempSrc:   PainSc: Asleep                 Ileane Sando,W. EDMOND

## 2018-12-13 NOTE — Progress Notes (Signed)
  Echocardiogram 2D Echocardiogram has been performed.  Gregory Padilla 12/13/2018, 8:59 AM

## 2018-12-13 NOTE — H&P (Signed)
   History and Physical Update  The patient was interviewed and re-examined.  The patient's previous History and Physical has been reviewed and is unchanged from recent office visit. Plan for left TCAR pending echo evaluation.   Gregory Padilla C. Donzetta Matters, MD [facility] [place] Office: [phone] Pager: [phone]   12/13/2018, 7:17 AM

## 2018-12-13 NOTE — Transfer of Care (Signed)
Immediate Anesthesia Transfer of Care Note  Patient: Gregory Padilla  Procedure(s) Performed: Dorthula Perfect ARTERY REVASCULARIZATION (Left )  Patient Location: PACU  Anesthesia Type:General  Level of Consciousness: drowsy, patient cooperative and responds to stimulation  Airway & Oxygen Therapy: Patient Spontanous Breathing and Patient connected to face mask oxygen  Post-op Assessment: Report given to RN and Post -op Vital signs reviewed and stable  Post vital signs: Reviewed and stable  Last Vitals:  Vitals Value Taken Time  BP 119/90 12/13/2018 12:03 PM  Temp    Pulse 66 12/13/2018 12:06 PM  Resp 12 12/13/2018 12:06 PM  SpO2 100 % 12/13/2018 12:06 PM  Vitals shown include unvalidated device data.  Last Pain:  Vitals:   12/13/18 1204  TempSrc:   PainSc: (P) 0-No pain     BP within goals (< 180 mmHg per Donzetta Matters MD).    Complications: No apparent anesthesia complications

## 2018-12-13 NOTE — Anesthesia Procedure Notes (Signed)
Procedure Name: Intubation Date/Time: 12/13/2018 10:25 AM Performed by: Jearld Pies, CRNA Pre-anesthesia Checklist: Patient identified, Emergency Drugs available, Suction available and Patient being monitored Patient Re-evaluated:Patient Re-evaluated prior to induction Oxygen Delivery Method: Circle System Utilized Preoxygenation: Pre-oxygenation with 100% oxygen Induction Type: IV induction Ventilation: Mask ventilation without difficulty and Oral airway inserted - appropriate to patient size Laryngoscope Size: Mac and 4 Grade View: Grade I Tube type: Oral Tube size: 7.5 mm Number of attempts: 1 Airway Equipment and Method: Stylet and Oral airway Placement Confirmation: ETT inserted through vocal cords under direct vision,  positive ETCO2 and breath sounds checked- equal and bilateral Secured at: 23 cm Tube secured with: Tape Dental Injury: Teeth and Oropharynx as per pre-operative assessment

## 2018-12-14 ENCOUNTER — Encounter (HOSPITAL_COMMUNITY): Payer: Self-pay | Admitting: Vascular Surgery

## 2018-12-14 LAB — CBC
HCT: 32.1 % — ABNORMAL LOW (ref 39.0–52.0)
Hemoglobin: 10.5 g/dL — ABNORMAL LOW (ref 13.0–17.0)
MCH: 30.3 pg (ref 26.0–34.0)
MCHC: 32.7 g/dL (ref 30.0–36.0)
MCV: 92.8 fL (ref 80.0–100.0)
PLATELETS: 207 10*3/uL (ref 150–400)
RBC: 3.46 MIL/uL — ABNORMAL LOW (ref 4.22–5.81)
RDW: 12.2 % (ref 11.5–15.5)
WBC: 11.9 10*3/uL — ABNORMAL HIGH (ref 4.0–10.5)
nRBC: 0 % (ref 0.0–0.2)

## 2018-12-14 LAB — BASIC METABOLIC PANEL
Anion gap: 9 (ref 5–15)
BUN: 12 mg/dL (ref 8–23)
CO2: 23 mmol/L (ref 22–32)
Calcium: 8.7 mg/dL — ABNORMAL LOW (ref 8.9–10.3)
Chloride: 97 mmol/L — ABNORMAL LOW (ref 98–111)
Creatinine, Ser: 1.17 mg/dL (ref 0.61–1.24)
GFR calc Af Amer: >60 mL/min (ref >60)
GFR calc non Af Amer: 54 mL/min — ABNORMAL LOW (ref >60)
Glucose, Bld: 178 mg/dL — ABNORMAL HIGH (ref 70–99)
Potassium: 4.5 mmol/L (ref 3.5–5.1)
SODIUM: 129 mmol/L — AB (ref 135–145)

## 2018-12-14 LAB — POCT ACTIVATED CLOTTING TIME: Activated Clotting Time: 301 seconds

## 2018-12-14 MED ORDER — TRAMADOL HCL 50 MG PO TABS: 50.0000 mg | ORAL_TABLET | Freq: Four times a day (QID) | ORAL | 0 refills | Status: DC | PRN

## 2018-12-14 NOTE — Progress Notes (Addendum)
[facility] [place]  Subjective  - Doing well, no new complaints.   Objective (!) 141/56 62 99 F (37.2 C) (Oral) 10 97%  Intake/Output Summary (Last 24 hours) at 12/14/2018 0727 Last data filed at 12/14/2018 7628 Gross per 24 hour  Intake 1900 ml  Output 1350 ml  Net 550 ml    Left neck incision healing well, minimal ecchymosis Grip 5/5 B UE, palpable radial pulses No tongue deviation and smile is symmetric Right groin soft Feet warm and well -perfused Heart RRR Lungs non labored breathing A & O x 3   Assessment/Planning: POD # 1 TCAR left for Symptomatichistory of recent amaurosis with  carotid stenosis  Stable post op voided, tolerating PO's pending ambulation he will be discharged home today with a f/u visit in 4 weeks and carotid duplex   Roxy Horseman 12/14/2018 7:27 AM --  Laboratory Lab Results: Recent Labs    12/13/18 0714 12/14/18 0315  WBC 8.5 11.9*  HGB 11.8* 10.5*  HCT 36.7* 32.1*  PLT 213 207   BMET Recent Labs    12/13/18 0714 12/14/18 0315  NA 133* 129*  K 4.0 4.5  CL 96* 97*  CO2 23 23  GLUCOSE 136* 178*  BUN 11 12  CREATININE 1.23 1.17  CALCIUM 9.3 8.7*    COAG Lab Results  Component Value Date   INR 1.0 12/13/2018   INR 1.00 10/21/2018   INR 1.17 01/27/2015   No results found for: PTT  I have independently interviewed and examined patient and agree with PA assessment and plan above.   Krystol Rocco C. Donzetta Matters, MD [facility] [place] Office: [phone] Pager: [phone]

## 2018-12-14 NOTE — Care Management Note (Signed)
Case Management Note Marvetta Gibbons RN, BSN Transitions of Care Unit 4E- RN Case Manager [phone]  Patient Details  Name: Gregory Padilla MRN: [id number] Date of Birth: 1925/08/23  Subjective/Objective:    Pt admitted s/p CEA                Action/Plan: PTA pt lived at home, notified by [facility] with [facility] they have pre-op referral for [facility] needs and will f/u post op for any needed services  Expected Discharge Date:  12/14/18               Expected Discharge Plan:  [place]  In-House Referral:     Discharge planning Services  CM Consult  Post Acute Care Choice:  Home Health Choice offered to:     DME Arranged:    DME Agency:     HH Arranged:    [place] Agency:  [facility]  Status of Service:  Completed, signed off  If discussed at [place] of Stay Meetings, dates discussed:    Discharge Disposition: home/home health   Additional Comments:  Dawayne Patricia, RN 12/14/2018, 3:06 PM

## 2018-12-17 NOTE — Discharge Summary (Signed)
[facility] Discharge Summary   Patient ID:  Gregory Padilla MRN: [id number] DOB/AGE: March 11, 1925 85 y.o.  Admit date: 12/13/2018 Discharge date: 12/14/2018 Date of Surgery: 12/13/2018 Surgeon: Surgeon(s): Waynetta Sandy, MD Serafina Mitchell, MD  Admission Diagnosis: LEFT CAROTID STENOSIS  Discharge Diagnoses:  LEFT CAROTID STENOSIS  Secondary Diagnoses: Past Medical History:  Diagnosis Date  . Anemia    while in the [facility] as a young man  . Arthritis   . BPH (benign prostatic hyperplasia)   . Carotid artery occlusion   . Coccidioidomycosis   . Constipation   . CVA (cerebral vascular accident) ([place])    left sided weakness  . Degenerative joint disease   . Diabetes mellitus age 56   no insulin  . Hyperlipidemia   . Hypertension   . Meniere disease   . Skin cancer    history of skin cancer  . Stroke [facility])     Procedure(s): TRANSCAROTID ARTERY REVASCULARIZATION  Discharged Condition: stable  HPI:  Gregory Padilla is a 85 y.o. male was evaluated last week with known history of left-sided stroke as well as a TIA in 2017 but was not evaluated at that time.  Does not take aspirin due to Mnire's disease but does take Plavix daily.  Was noted to have a shade come down on his eye prior to last visit.  Did not have any speech issue, facial droop, hemiparesis.  Has had an issue like this about 8 years ago.  Travels via wheelchair but is very active.  States that he had some memory issues of late.  Hospital Course:  Gregory Padilla is a 85 y.o. male is S/P Left Procedure(s): TRANSCAROTID ARTERY REVASCULARIZATION Stable post op voided, tolerating PO's pending ambulation he will be discharged home today with a f/u visit in 4 weeks and carotid duplex      Significant Diagnostic Studies: CBC Lab Results  Component Value Date   WBC 11.9 (H) 12/14/2018   HGB 10.5 (L) 12/14/2018   HCT 32.1 (L) 12/14/2018   MCV 92.8 12/14/2018   PLT  207 12/14/2018    BMET    Component Value Date/Time   NA 129 (L) 12/14/2018 0315   NA 132 (L) 03/01/2014 1311   K 4.5 12/14/2018 0315   K 4.3 03/01/2014 1311   CL 97 (L) 12/14/2018 0315   CL 99 03/01/2014 1311   CO2 23 12/14/2018 0315   CO2 22 03/01/2014 1311   GLUCOSE 178 (H) 12/14/2018 0315   GLUCOSE 187 (H) 03/01/2014 1311   BUN 12 12/14/2018 0315   BUN 9 03/01/2014 1311   CREATININE 1.17 12/14/2018 0315   CREATININE 1.27 03/01/2014 1311   CALCIUM 8.7 (L) 12/14/2018 0315   CALCIUM 9.2 03/01/2014 1311   GFRNONAA 54 (L) 12/14/2018 0315   GFRNONAA 50 (L) 03/01/2014 1311   GFRAA >60 12/14/2018 0315   GFRAA 58 (L) 03/01/2014 1311   COAG Lab Results  Component Value Date   INR 1.0 12/13/2018   INR 1.00 10/21/2018   INR 1.17 01/27/2015     Disposition:  Discharge to :Home Discharge Instructions    Call MD for:  redness, tenderness, or signs of infection (pain, swelling, bleeding, redness, odor or green/yellow discharge around incision site)   Complete by:  As directed    Call MD for:  severe or increased pain, loss or decreased feeling  in affected limb(s)   Complete by:  As directed    Call MD for:  temperature >100.5   Complete by:  As directed    Resume previous diet   Complete by:  As directed      Allergies as of 12/14/2018      Reactions   Amitriptyline    unknown      Medication List    TAKE these medications   acetaminophen 500 MG tablet Commonly known as:  TYLENOL Take 1,000 mg by mouth every 6 (six) hours as needed for moderate pain.   ALPRAZolam 0.25 MG tablet Commonly known as:  XANAX Take 0.0625 mg by mouth at bedtime as needed for anxiety.   calcium citrate-vitamin D 200-200 MG-UNIT Tabs Take 1 tablet by mouth 2 (two) times daily.   clopidogrel 75 MG tablet Commonly known as:  PLAVIX Take 75 mg by mouth daily.   donepezil 10 MG tablet Commonly known as:  ARICEPT Take 10 mg by mouth at bedtime.   DULoxetine 30 MG capsule Commonly  known as:  CYMBALTA Take 30 mg by mouth daily.   esomeprazole 40 MG capsule Commonly known as:  NEXIUM Take 40 mg by mouth daily before breakfast.   ipratropium 0.06 % nasal spray Commonly known as:  ATROVENT Place 1 spray into both nostrils 2 (two) times daily as needed for rhinitis.   linaclotide 145 MCG Caps capsule Commonly known as:  Linzess Take 1 capsule (145 mcg total) by mouth daily.   Magnesium 250 MG Tabs Take 250 mg by mouth daily.   Melatonin 10 MG Tabs Take 10 mg by mouth at bedtime.   metFORMIN 500 MG tablet Commonly known as:  Glucophage Take 1 tablet (500 mg total) by mouth 2 (two) times daily with a meal.   mirabegron ER 50 MG Tb24 tablet Commonly known as:  MYRBETRIQ Take 50 mg by mouth 2 (two) times daily.   MiraLax powder Generic drug:  polyethylene glycol powder Take 17 g by mouth daily.   multivitamin with minerals Tabs tablet Take 1 tablet by mouth daily.   OVER THE COUNTER MEDICATION Take 1 tablet by mouth daily. Hyland's Leg Cramp   Potassium 99 MG Tabs Take 99 mg by mouth daily.   psyllium 58.6 % powder Commonly known as:  METAMUCIL Take 1 packet by mouth 2 (two) times daily.   rosuvastatin 5 MG tablet Commonly known as:  CRESTOR Take 5 mg by mouth daily.   Polyethyl Glycol-Propyl Glycol 0.4-0.3 % Gel ophthalmic gel Commonly known as:  SYSTANE Place 1 application into both eyes at bedtime.   SYSTANE OP Place 1 drop into both eyes 3 (three) times daily as needed (dry eyes).   traMADol 50 MG tablet Commonly known as:  ULTRAM Take 1 tablet (50 mg total) by mouth every 6 (six) hours as needed for moderate pain.   traZODone 50 MG tablet Commonly known as:  DESYREL Take 50 mg by mouth at bedtime.   VITAMIN C PO Take 1,000 mg by mouth daily.      Verbal and written Discharge instructions given to the patient. Wound care per Discharge AVS Follow-up Information    Waynetta Sandy, MD In 4 weeks.   Specialties:   Vascular Surgery, Cardiology Why:  Office will call you to arrange your appt (sent) Contact information: [place] [address] [phone]           Signed: Roxy Horseman 12/17/2018, 9:35 AM   --- For VQI Registry use --- Instructions: Press F2 to tab through selections.  Delete question if not  applicable.   Modified Rankin score at D/C (0-6): Rankin Score=0  IV medication needed for:  1. Hypertension: No 2. Hypotension: No  Post-op Complications: No  1. Post-op CVA or TIA: No  If yes: Event classification (right eye, left eye, right cortical, left cortical, verterobasilar, other):   If yes: Timing of event (intra-op, <6 hrs post-op, >=6 hrs post-op, unknown):   2. CN injury: No  If yes: CN  injuried   3. Myocardial infarction: No  If yes: Dx by (EKG or clinical, Troponin):   4.  CHF: No  5.  Dysrhythmia (new): No  6. Wound infection: No  7. Reperfusion symptoms: No  8. Return to OR: No  If yes: return to OR for (bleeding, neurologic, other CEA incision, other):   Discharge medications: Statin use:  Yes ASA use:  No  for medical reason meniers disease  Beta blocker use:  No  for medical reason   ACE-Inhibitor use:  No  for medical reason   P2Y12 Antagonist use: [ ]  None, [x ] Plavix, [ ]  Plasugrel, [ ]  Ticlopinine, [ ]  Ticagrelor, [ ]  Other, [ ]  No for medical reason, [ ]  Non-compliant, [ ]  Not-indicated Anti-coagulant use:  x[ ]  None, [ ]  Warfarin, [ ]  Rivaroxaban, [ ]  Dabigatran, [ ]  Other, [ ]  No for medical reason, [ ]  Non-compliant, [ ]  Not-indicated

## 2018-12-18 ENCOUNTER — Telehealth: Payer: Self-pay | Admitting: Vascular Surgery

## 2018-12-18 NOTE — Telephone Encounter (Signed)
Called and spoke with patient son Gregory Padilla.  Gave him the dates and times 01/04/19 at 10:00 U/S and 11:00 Dr. Donzetta Matters.  Will mail follow up paper work and reminder letter

## 2018-12-21 ENCOUNTER — Telehealth: Payer: Self-pay | Admitting: *Deleted

## 2018-12-21 NOTE — Telephone Encounter (Signed)
Received a call from [place] from [facility] she states patient's son told her patient has been having some shortness of breath at times and BP was dropping low at times. Advised her to let patient son know if patient having SOB he should be seen in the ER for evaluation. She stated patient son declined at this time to take patient to ER.

## 2019-01-04 ENCOUNTER — Encounter (HOSPITAL_COMMUNITY): Payer: Medicare Other

## 2019-01-04 ENCOUNTER — Encounter: Payer: Medicare Other | Admitting: Vascular Surgery

## 2019-02-25 ENCOUNTER — Telehealth: Payer: Self-pay | Admitting: *Deleted

## 2019-02-25 NOTE — Telephone Encounter (Signed)
Ron called to say that Gregory Padilla was experiencing some intermittent pain that runs "on the muscle beside his incision" x 3-4 days (s/p left TCAR on 12-13-18 by Dr. Donzetta Matters). He says the patient is able to swallow, breathe and eat without any difficulty. He is afebrile and his incision is c/d/i and he has no S&S of infection at this time.  It bothers him mostly in the mornings after he wakes up. He does not have a headache. Patient is taking Tylenol every day for his arthritic pain. I have instructed patient to try warm compresses to the area to see if he gets any relief. Will monitor to see if any certain movement causes replication of this pain and Ron will contact me if patient gets any worse, has different symptoms, or needs to come in on Friday to see Dr. Donzetta Matters for evaluation. Ron voiced understanding and agreement of this plan.

## 2019-02-28 ENCOUNTER — Telehealth: Payer: Self-pay

## 2019-02-28 NOTE — Telephone Encounter (Signed)
Son called back to say that his dad is still complaining of discomfort and swelling in his neck and he was told to call back if there was no improvement.   Appt made for pt to be seen.   York Cerise, CMA

## 2019-03-01 ENCOUNTER — Ambulatory Visit (INDEPENDENT_AMBULATORY_CARE_PROVIDER_SITE_OTHER): Payer: Self-pay | Admitting: Vascular Surgery

## 2019-03-01 ENCOUNTER — Other Ambulatory Visit (HOSPITAL_COMMUNITY): Payer: Self-pay | Admitting: Vascular Surgery

## 2019-03-01 ENCOUNTER — Ambulatory Visit (HOSPITAL_COMMUNITY)
Admission: RE | Admit: 2019-03-01 | Discharge: 2019-03-01 | Disposition: A | Discharge status: Home / Self Care | Payer: Medicare Other | Source: Ambulatory Visit | Attending: Vascular Surgery | Admitting: Vascular Surgery

## 2019-03-01 ENCOUNTER — Encounter: Payer: Self-pay | Admitting: Vascular Surgery

## 2019-03-01 ENCOUNTER — Other Ambulatory Visit: Payer: Self-pay

## 2019-03-01 VITALS — BP 128/56 | HR 57 | Temp 96.7°F | Resp 18 | Ht 71.0 in | Wt 182.0 lb

## 2019-03-01 DIAGNOSIS — I6523 Occlusion and stenosis of bilateral carotid arteries: Secondary | ICD-10-CM

## 2019-03-01 DIAGNOSIS — I6522 Occlusion and stenosis of left carotid artery: Secondary | ICD-10-CM

## 2019-03-01 NOTE — Progress Notes (Signed)
    Subjective:     Patient ID: Gregory Padilla, male   DOB: Aug 13, 1925, 85 y.o.   MRN: [id number]  HPI 85 year old male follows up after left-sided trans-carotid artery stent for symptomatic disease.  He remains on Plavix cannot take aspirin secondary to Mnire's disease.  Has had some pain in his neck that started approximately 2 weeks ago.  This is relieved partially with Tylenol.  He is able to sleep at night.  Does not note any new injury to his neck but does have a chronic neck injury.  Otherwise he is doing well without further stroke TIA or amaurosis.   Review of Systems Left-sided neck pain    Objective:   Physical Exam Vitals:   03/01/19 1256  BP: (!) 128/56  Pulse: (!) 57  Resp: 18  Temp: (!) 96.7 F (35.9 C)  SpO2: 98%   Awake alert oriented Nonlabored respirations No outward abnormalities of left neck Incision well-healed left neck no bruising or hematoma Moving all extremities well without limitation  I interpreted his bilateral carotid duplex today as performed after our visit which demonstrates 1 to 39% stenosis on the right and no in-stent stenosis on the left.    Assessment/plan     85 year old male status post left trans-carotid artery stent for symptomatic disease now with left neck pain that did not occur for several weeks after surgery with no outward abnormalities.  Carotid duplex normal today.  We will plan virtual visit next week.  He will take Tylenol as he cannot take NSAIDs secondary to Mnire's disease.  Hopefully pain will be self-limited does not appear to be acutely surgically related.       Eileen Croswell C. Donzetta Matters, MD [facility] [place] Office: [phone] Pager: [phone]

## 2019-03-08 ENCOUNTER — Encounter: Payer: Self-pay | Admitting: Vascular Surgery

## 2019-03-08 ENCOUNTER — Ambulatory Visit (INDEPENDENT_AMBULATORY_CARE_PROVIDER_SITE_OTHER): Payer: Self-pay | Admitting: Vascular Surgery

## 2019-03-08 ENCOUNTER — Other Ambulatory Visit: Payer: Self-pay

## 2019-03-08 VITALS — BP 104/56 | Wt 182.0 lb

## 2019-03-08 DIAGNOSIS — I6522 Occlusion and stenosis of left carotid artery: Secondary | ICD-10-CM

## 2019-03-08 NOTE — Progress Notes (Signed)
   Virtual Visit via Video Note   I connected with Gregory Padilla on 03/08/2019 using the [employer] virtual platform and verified that I was speaking with the correct person using two identifiers. Patient was located at at home with his son and I am at the [facility] office.   The limitations of evaluation and management by telemedicine and the availability of in person appointments have been previously discussed with the patient and are documented in the patients chart. The patient expressed understanding and consented to proceed.   PCP: Maryland Pink, MD  Chief Complaint: Left neck pain  History of Present Illness: Gregory Padilla is a 85 y.o. male with recent history of left sided trans-carotid artery stenting for symptomatic disease.  Patient continues to take Plavix not on aspirin secondary to Mnire's disease.  I did see him last week he was complaining of left-sided neck pain.  Duplex was performed which demonstrated no significant abnormalities within the stent.  We are now calling to discuss with the family and follow-up to see if the patient is feeling better  Patient states he does still have some pain up near his left ear overall he is doing better mostly is having discomfort with different positions while sleeping.  Past Medical History:  Diagnosis Date  . Anemia    while in the [facility] as a young man  . Arthritis   . BPH (benign prostatic hyperplasia)   . Carotid artery occlusion   . Coccidioidomycosis   . Constipation   . CVA (cerebral vascular accident) ([place])    left sided weakness  . Degenerative joint disease   . Diabetes mellitus age 42   no insulin  . Hyperlipidemia   . Hypertension   . Meniere disease   . Skin cancer    history of skin cancer  . Stroke [facility])     Past Surgical History:  Procedure Laterality Date  . BACK SURGERY  2004  . CHOLECYSTECTOMY    . PROSTATE SURGERY     x2  . TRANSCAROTID ARTERY REVASCULARIZATION Left 12/13/2018   Procedure: TRANSCAROTID ARTERY REVASCULARIZATION;  Surgeon: Waynetta Sandy, MD;  Location: [facility];  Service: Vascular;  Laterality: Left;    No outpatient medications have been marked as taking for the 03/08/19 encounter (Office Visit) with Waynetta Sandy, MD.    12 system ROS was negative unless otherwise noted in HPI  Observations/Objective: Vitals:   03/08/19 1214  BP: (!) 104/56   He is awake and alert answering questions patient is awake and alert answers questions appropriately He has nonlabored respirations speaking fluently He does not appear to have any neurologic deficits    Assessment and Plan: 85 year old male status post left trans-carotid artery stenting.  Pain in his left neck is improving and duplex was without abnormality.  He will continue Plavix we will see him in another 5 or 6 months with repeat carotid duplex unless he has issues prior.  I discussed this with the patient and his son via video conference and they demonstrate good understanding    I spent 5 minutes with the patient via video encounter.   Signed, Servando Snare [facility] Office: [phone]  03/08/2019, 12:40 PM

## 2020-01-16 ENCOUNTER — Other Ambulatory Visit: Payer: Self-pay

## 2020-01-16 ENCOUNTER — Emergency Department: Payer: Medicare Other

## 2020-01-16 ENCOUNTER — Observation Stay: Payer: Medicare Other

## 2020-01-16 ENCOUNTER — Observation Stay
Admission: EM | Admit: 2020-01-16 | Discharge: 2020-01-17 | Disposition: A | Discharge status: Home / Self Care | Payer: Medicare Other | Attending: Internal Medicine | Admitting: Internal Medicine

## 2020-01-16 DIAGNOSIS — Z7902 Long term (current) use of antithrombotics/antiplatelets: Secondary | ICD-10-CM | POA: Insufficient documentation

## 2020-01-16 DIAGNOSIS — G8929 Other chronic pain: Secondary | ICD-10-CM | POA: Insufficient documentation

## 2020-01-16 DIAGNOSIS — Z66 Do not resuscitate: Secondary | ICD-10-CM | POA: Insufficient documentation

## 2020-01-16 DIAGNOSIS — M4802 Spinal stenosis, cervical region: Secondary | ICD-10-CM | POA: Insufficient documentation

## 2020-01-16 DIAGNOSIS — G9389 Other specified disorders of brain: Secondary | ICD-10-CM | POA: Diagnosis not present

## 2020-01-16 DIAGNOSIS — E119 Type 2 diabetes mellitus without complications: Secondary | ICD-10-CM | POA: Diagnosis not present

## 2020-01-16 DIAGNOSIS — F329 Major depressive disorder, single episode, unspecified: Secondary | ICD-10-CM | POA: Insufficient documentation

## 2020-01-16 DIAGNOSIS — K219 Gastro-esophageal reflux disease without esophagitis: Secondary | ICD-10-CM | POA: Insufficient documentation

## 2020-01-16 DIAGNOSIS — I700 Atherosclerosis of aorta: Secondary | ICD-10-CM | POA: Diagnosis not present

## 2020-01-16 DIAGNOSIS — I10 Essential (primary) hypertension: Secondary | ICD-10-CM | POA: Insufficient documentation

## 2020-01-16 DIAGNOSIS — Z85828 Personal history of other malignant neoplasm of skin: Secondary | ICD-10-CM | POA: Insufficient documentation

## 2020-01-16 DIAGNOSIS — R531 Weakness: Secondary | ICD-10-CM | POA: Diagnosis not present

## 2020-01-16 DIAGNOSIS — Z955 Presence of coronary angioplasty implant and graft: Secondary | ICD-10-CM | POA: Diagnosis not present

## 2020-01-16 DIAGNOSIS — Z20822 Contact with and (suspected) exposure to covid-19: Secondary | ICD-10-CM | POA: Diagnosis not present

## 2020-01-16 DIAGNOSIS — F0390 Unspecified dementia without behavioral disturbance: Secondary | ICD-10-CM | POA: Insufficient documentation

## 2020-01-16 DIAGNOSIS — Z7984 Long term (current) use of oral hypoglycemic drugs: Secondary | ICD-10-CM | POA: Insufficient documentation

## 2020-01-16 DIAGNOSIS — F418 Other specified anxiety disorders: Secondary | ICD-10-CM | POA: Diagnosis present

## 2020-01-16 DIAGNOSIS — I6381 Other cerebral infarction due to occlusion or stenosis of small artery: Secondary | ICD-10-CM | POA: Diagnosis not present

## 2020-01-16 DIAGNOSIS — N400 Enlarged prostate without lower urinary tract symptoms: Secondary | ICD-10-CM | POA: Insufficient documentation

## 2020-01-16 DIAGNOSIS — R55 Syncope and collapse: Principal | ICD-10-CM | POA: Insufficient documentation

## 2020-01-16 DIAGNOSIS — M1990 Unspecified osteoarthritis, unspecified site: Secondary | ICD-10-CM | POA: Insufficient documentation

## 2020-01-16 DIAGNOSIS — I639 Cerebral infarction, unspecified: Secondary | ICD-10-CM | POA: Diagnosis present

## 2020-01-16 DIAGNOSIS — I69354 Hemiplegia and hemiparesis following cerebral infarction affecting left non-dominant side: Secondary | ICD-10-CM | POA: Diagnosis not present

## 2020-01-16 DIAGNOSIS — Z79899 Other long term (current) drug therapy: Secondary | ICD-10-CM | POA: Diagnosis not present

## 2020-01-16 DIAGNOSIS — F419 Anxiety disorder, unspecified: Secondary | ICD-10-CM | POA: Diagnosis not present

## 2020-01-16 DIAGNOSIS — E785 Hyperlipidemia, unspecified: Secondary | ICD-10-CM | POA: Insufficient documentation

## 2020-01-16 DIAGNOSIS — I350 Nonrheumatic aortic (valve) stenosis: Secondary | ICD-10-CM | POA: Diagnosis not present

## 2020-01-16 LAB — CBC
HCT: 33.2 % — ABNORMAL LOW (ref 39.0–52.0)
Hemoglobin: 11.1 g/dL — ABNORMAL LOW (ref 13.0–17.0)
MCH: 31.9 pg (ref 26.0–34.0)
MCHC: 33.4 g/dL (ref 30.0–36.0)
MCV: 95.4 fL (ref 80.0–100.0)
Platelets: 219 10*3/uL (ref 150–400)
RBC: 3.48 MIL/uL — ABNORMAL LOW (ref 4.22–5.81)
RDW: 11.8 % (ref 11.5–15.5)
WBC: 6.2 10*3/uL (ref 4.0–10.5)
nRBC: 0 % (ref 0.0–0.2)

## 2020-01-16 LAB — BASIC METABOLIC PANEL
Anion gap: 10 (ref 5–15)
BUN: 10 mg/dL (ref 8–23)
CO2: 24 mmol/L (ref 22–32)
Calcium: 8.6 mg/dL — ABNORMAL LOW (ref 8.9–10.3)
Chloride: 99 mmol/L (ref 98–111)
Creatinine, Ser: 1.09 mg/dL (ref 0.61–1.24)
GFR calc Af Amer: >60 mL/min (ref >60)
GFR calc non Af Amer: 59 mL/min — ABNORMAL LOW (ref >60)
Glucose, Bld: 171 mg/dL — ABNORMAL HIGH (ref 70–99)
Potassium: 4.7 mmol/L (ref 3.5–5.1)
Sodium: 133 mmol/L — ABNORMAL LOW (ref 135–145)

## 2020-01-16 LAB — TROPONIN I (HIGH SENSITIVITY): Troponin I (High Sensitivity): 9 ng/L (ref <18)

## 2020-01-16 LAB — GLUCOSE, CAPILLARY
Glucose-Capillary: 158 mg/dL — ABNORMAL HIGH (ref 70–99)
Glucose-Capillary: 157 mg/dL — ABNORMAL HIGH (ref 70–99)
Glucose-Capillary: 196 mg/dL — ABNORMAL HIGH (ref 70–99)

## 2020-01-16 LAB — URINALYSIS, COMPLETE (UACMP) WITH MICROSCOPIC
Bacteria, UA: NONE SEEN
Bilirubin Urine: NEGATIVE
Glucose, UA: NEGATIVE mg/dL
Hgb urine dipstick: NEGATIVE
Ketones, ur: NEGATIVE mg/dL
Leukocytes,Ua: NEGATIVE
Nitrite: NEGATIVE
Protein, ur: NEGATIVE mg/dL
Specific Gravity, Urine: 1.01 (ref 1.005–1.030)
Squamous Epithelial / HPF: NONE SEEN (ref 0–5)
pH: 8 (ref 5.0–8.0)

## 2020-01-16 LAB — SARS CORONAVIRUS 2 (TAT 6-24 HRS): SARS Coronavirus 2: NEGATIVE

## 2020-01-16 MED ORDER — ONDANSETRON HCL 4 MG PO TABS: 4.0000 mg | ORAL_TABLET | Freq: Four times a day (QID) | ORAL | Status: DC | PRN

## 2020-01-16 MED ORDER — ASCORBIC ACID 500 MG PO TABS
1000.0000 mg | ORAL_TABLET | Freq: Every day | ORAL | Status: DC
  Administered 2020-01-17: 12:00:00 1000 mg via ORAL
  Filled 2020-01-16: qty 2

## 2020-01-16 MED ORDER — CLOPIDOGREL BISULFATE 75 MG PO TABS
75.0000 mg | ORAL_TABLET | Freq: Every day | ORAL | Status: DC
  Administered 2020-01-17: 12:00:00 75 mg via ORAL
  Filled 2020-01-16: qty 1

## 2020-01-16 MED ORDER — ONDANSETRON HCL 4 MG/2ML IJ SOLN: 4.0000 mg | Freq: Four times a day (QID) | INTRAMUSCULAR | Status: DC | PRN

## 2020-01-16 MED ORDER — MIRABEGRON ER 50 MG PO TB24
50.0000 mg | ORAL_TABLET | Freq: Two times a day (BID) | ORAL | Status: DC
  Administered 2020-01-17 (×2): 50 mg via ORAL
  Filled 2020-01-16 (×3): qty 1

## 2020-01-16 MED ORDER — DULOXETINE HCL 30 MG PO CPEP
30.0000 mg | ORAL_CAPSULE | Freq: Every day | ORAL | Status: DC
  Administered 2020-01-17: 11:00:00 30 mg via ORAL
  Filled 2020-01-16: qty 1

## 2020-01-16 MED ORDER — MELATONIN 5 MG PO TABS
10.0000 mg | ORAL_TABLET | Freq: Every day | ORAL | Status: DC
  Administered 2020-01-17: 10 mg via ORAL
  Filled 2020-01-16 (×2): qty 2

## 2020-01-16 MED ORDER — ALPRAZOLAM 0.25 MG PO TABS: 0.1250 mg | ORAL_TABLET | Freq: Every evening | ORAL | Status: DC | PRN

## 2020-01-16 MED ORDER — SODIUM CHLORIDE 0.9% FLUSH
3.0000 mL | Freq: Once | INTRAVENOUS | Status: AC
  Administered 2020-01-16: 3 mL via INTRAVENOUS

## 2020-01-16 MED ORDER — PANTOPRAZOLE SODIUM 40 MG PO TBEC
40.0000 mg | DELAYED_RELEASE_TABLET | Freq: Every day | ORAL | Status: DC
  Administered 2020-01-16 – 2020-01-17 (×2): 40 mg via ORAL
  Filled 2020-01-16 (×2): qty 1

## 2020-01-16 MED ORDER — MEMANTINE HCL 5 MG PO TABS
10.0000 mg | ORAL_TABLET | Freq: Two times a day (BID) | ORAL | Status: DC
  Administered 2020-01-16 – 2020-01-17 (×2): 10 mg via ORAL
  Filled 2020-01-16 (×2): qty 2

## 2020-01-16 MED ORDER — ROSUVASTATIN CALCIUM 5 MG PO TABS
5.0000 mg | ORAL_TABLET | Freq: Every day | ORAL | Status: DC
  Filled 2020-01-16: qty 1

## 2020-01-16 MED ORDER — TRAZODONE HCL 50 MG PO TABS
50.0000 mg | ORAL_TABLET | Freq: Every day | ORAL | Status: DC
  Administered 2020-01-16: 23:00:00 50 mg via ORAL
  Filled 2020-01-16: qty 1

## 2020-01-16 MED ORDER — INSULIN ASPART 100 UNIT/ML ~~LOC~~ SOLN
0.0000 [IU] | Freq: Every day | SUBCUTANEOUS | Status: DC
  Filled 2020-01-16: qty 1

## 2020-01-16 MED ORDER — SODIUM CHLORIDE 0.9 % IV SOLN: INTRAVENOUS | Status: DC

## 2020-01-16 MED ORDER — DONEPEZIL HCL 5 MG PO TABS
10.0000 mg | ORAL_TABLET | Freq: Every day | ORAL | Status: DC
  Administered 2020-01-16: 23:00:00 10 mg via ORAL
  Filled 2020-01-16: qty 2

## 2020-01-16 MED ORDER — ADULT MULTIVITAMIN W/MINERALS CH
1.0000 | ORAL_TABLET | Freq: Every day | ORAL | Status: DC
  Administered 2020-01-17: 1 via ORAL
  Filled 2020-01-16: qty 1

## 2020-01-16 MED ORDER — IPRATROPIUM BROMIDE 0.06 % NA SOLN
1.0000 | Freq: Two times a day (BID) | NASAL | Status: DC | PRN
  Filled 2020-01-16: qty 15

## 2020-01-16 MED ORDER — PSYLLIUM 95 % PO PACK
1.0000 | PACK | Freq: Two times a day (BID) | ORAL | Status: DC
  Administered 2020-01-17: 1 via ORAL
  Filled 2020-01-16 (×3): qty 1

## 2020-01-16 MED ORDER — ENOXAPARIN SODIUM 40 MG/0.4ML ~~LOC~~ SOLN
40.0000 mg | SUBCUTANEOUS | Status: DC
  Administered 2020-01-16: 40 mg via SUBCUTANEOUS
  Filled 2020-01-16: qty 0.4

## 2020-01-16 MED ORDER — INSULIN ASPART 100 UNIT/ML ~~LOC~~ SOLN: 0.0000 [IU] | Freq: Three times a day (TID) | SUBCUTANEOUS | Status: DC

## 2020-01-16 MED ORDER — ACETAMINOPHEN 650 MG RE SUPP: 650.0000 mg | Freq: Four times a day (QID) | RECTAL | Status: DC | PRN

## 2020-01-16 MED ORDER — SODIUM CHLORIDE 0.9% FLUSH
3.0000 mL | Freq: Two times a day (BID) | INTRAVENOUS | Status: DC
  Administered 2020-01-17: 12:00:00 3 mL via INTRAVENOUS

## 2020-01-16 MED ORDER — ACETAMINOPHEN 325 MG PO TABS
650.0000 mg | ORAL_TABLET | Freq: Four times a day (QID) | ORAL | Status: DC | PRN
  Filled 2020-01-16: qty 2

## 2020-01-16 MED ORDER — HYDRALAZINE HCL 20 MG/ML IJ SOLN
5.0000 mg | INTRAMUSCULAR | Status: DC | PRN
  Filled 2020-01-16: qty 0.25

## 2020-01-16 MED ORDER — CALCIUM CARBONATE-VITAMIN D 500-200 MG-UNIT PO TABS
1.0000 | ORAL_TABLET | Freq: Two times a day (BID) | ORAL | Status: DC
  Administered 2020-01-17: 1 via ORAL
  Filled 2020-01-16 (×2): qty 1

## 2020-01-16 NOTE — ED Notes (Signed)
Pt unable to urinate for urine sample, in and out cathd, output 450

## 2020-01-16 NOTE — ED Triage Notes (Signed)
Pt comes into the ED via EMS from home, states pt had a syncople episode while sitting in the chair today. States he has a hx of syncope especially when not taking in enough fluids. Pt only complaint is Bl leg cramps.

## 2020-01-16 NOTE — ED Provider Notes (Signed)
[facility] Emergency Department Provider Note  ____________________________________________   First MD Initiated Contact with Patient 01/16/20 1225     (approximate)  I have reviewed the triage vital signs and the nursing notes.   HISTORY  Chief Complaint Loss of Consciousness    HPI Gregory Padilla is a 86 y.o. male  Here with syncope. Pt reports that over the past day or so, he has felt "very tired" and fatigued. He woke up this AM and went to eat breakfast. While sitting down, he reportedly had multiple witnessed episodes in which he lost consciousness and was unresponsive. There was no seizure like activity. He does not recall the episodes. He states he feels "drained" and "out of energy" but denies any chest pain, palpitations. Of note, pt did start gabapentin for chronic hip pain 2 weeks ago. No other med changes. No recent fever or chills.        Past Medical History:  Diagnosis Date  . Anemia    while in the [facility] as a young man  . Arthritis   . BPH (benign prostatic hyperplasia)   . Carotid artery occlusion   . Coccidioidomycosis   . Constipation   . CVA (cerebral vascular accident) ([place])    left sided weakness  . Degenerative joint disease   . Diabetes mellitus age 23   no insulin  . Hyperlipidemia   . Hypertension   . Meniere disease   . Skin cancer    history of skin cancer  . Stroke [facility])     Patient Active Problem List   Diagnosis Date Noted  . Syncope 01/16/2020  . Depression with anxiety 01/16/2020  . Carotid stenosis 12/13/2018  . CAP (community acquired pneumonia) 01/27/2015  . Sepsis ([place]) 01/27/2015  . Community acquired bacterial pneumonia 01/27/2015  . Stroke ([place])   . Arthritis   . Carotid artery occlusion   . CVA (cerebral vascular accident) ([place])   . Hyperlipidemia   . Hypertension   . BPH (benign prostatic hyperplasia)   . Skin cancer   . Weakness   . Pain in limb-Right trunk 07/18/2013  .  Occlusion and stenosis of carotid artery without mention of cerebral infarction 07/18/2013  . TIA (transient ischemic attack) 09/15/2011  . Diabetes mellitus without complication ([place]) [id number]  . PERSONAL HISTORY OF COLONIC POLYPS 10/28/2009    Past Surgical History:  Procedure Laterality Date  . BACK SURGERY  2004  . CHOLECYSTECTOMY    . PROSTATE SURGERY     x2  . TRANSCAROTID ARTERY REVASCULARIZATION Left 12/13/2018   Procedure: TRANSCAROTID ARTERY REVASCULARIZATION;  Surgeon: Waynetta Sandy, MD;  Location: [facility];  Service: Vascular;  Laterality: Left;    Prior to Admission medications   Medication Sig Start Date End Date Taking? Authorizing Provider  acetaminophen (TYLENOL) 500 MG tablet Take 1,000 mg by mouth every 6 (six) hours as needed for moderate pain.    Yes [provider]  ALPRAZolam (XANAX) 0.25 MG tablet Take 0.0625 mg by mouth at bedtime as needed for anxiety.    Yes [provider]  Ascorbic Acid (VITAMIN C PO) Take 1,000 mg by mouth daily.    Yes [provider]  calcium citrate-vitamin D 200-200 MG-UNIT TABS Take 1 tablet by mouth 2 (two) times daily.     Yes [provider]  clopidogrel (PLAVIX) 75 MG tablet Take 75 mg by mouth daily.     Yes [provider]  donepezil (ARICEPT) 10 MG  tablet Take 10 mg by mouth at bedtime.  01/25/16  Yes [provider]  DULoxetine (CYMBALTA) 30 MG capsule Take 30 mg by mouth daily.   Yes [provider]  esomeprazole (NEXIUM) 40 MG capsule Take 40 mg by mouth daily before breakfast.     Yes [provider]  ipratropium (ATROVENT) 0.06 % nasal spray Place 1 spray into both nostrils 2 (two) times daily as needed for rhinitis.    Yes [provider]  Melatonin 10 MG TABS Take 10 mg by mouth at bedtime.   Yes [provider]  memantine (NAMENDA) 10 MG tablet Take 10 mg by mouth 2 (two) times daily. 11/01/19  Yes [provider]    mirabegron ER (MYRBETRIQ) 50 MG TB24 tablet Take 50 mg by mouth 2 (two) times daily.    Yes [provider]  Multiple Vitamin (MULTIVITAMIN WITH MINERALS) TABS tablet Take 1 tablet by mouth daily.   Yes [provider]  Potassium 99 MG TABS Take 99 mg by mouth daily.   Yes [provider]  psyllium (METAMUCIL) 58.6 % powder Take 1 packet by mouth 2 (two) times daily.    Yes [provider]  rosuvastatin (CRESTOR) 5 MG tablet Take 5 mg by mouth daily.     Yes [provider]  traZODone (DESYREL) 50 MG tablet Take 50 mg by mouth at bedtime.   Yes [provider]    Allergies Amitriptyline  Family History  Problem Relation Age of Onset  . Diabetes Mother   . Arthritis Mother   . Coronary artery disease Father   . Heart disease Father   . Stroke Neg Hx     Social History Social History   Tobacco Use  . Smoking status: Never Smoker  . Smokeless tobacco: Never Used  Substance Use Topics  . Alcohol use: No    Alcohol/week: 0.0 standard drinks  . Drug use: No    Review of Systems  Review of Systems  Constitutional: Positive for fatigue. Negative for chills and fever.  HENT: Negative for sore throat.   Respiratory: Negative for shortness of breath.   Cardiovascular: Negative for chest pain.  Gastrointestinal: Negative for abdominal pain.  Genitourinary: Negative for flank pain.  Musculoskeletal: Negative for neck pain.  Skin: Negative for rash and wound.  Allergic/Immunologic: Negative for immunocompromised state.  Neurological: Positive for syncope. Negative for weakness and numbness.  Hematological: Does not bruise/bleed easily.  All other systems reviewed and are negative.    ____________________________________________  PHYSICAL EXAM:      VITAL SIGNS: ED Triage Vitals  Enc Vitals Group     BP 01/16/20 1226 (!) 134/59     Pulse Rate 01/16/20 1226 (!) 50     Resp 01/16/20 1226 17     Temp 01/16/20 1229 98.1 F  (36.7 C)     Temp Source 01/16/20 1226 Oral     SpO2 01/16/20 1226 100 %     Weight 01/16/20 1227 180 lb (81.6 kg)     Height 01/16/20 1227 6\' 2"  (1.88 m)     Head Circumference --      Peak Flow --      Pain Score 01/16/20 1227 0     Pain Loc --      Pain Edu? --      Excl. in [place]? --      Physical Exam Vitals and nursing note reviewed.  Constitutional:      General: He is  not in acute distress.    Appearance: He is well-developed.  HENT:     Head: Normocephalic and atraumatic.  Eyes:     Conjunctiva/sclera: Conjunctivae normal.  Cardiovascular:     Rate and Rhythm: Normal rate and regular rhythm.     Heart sounds: Murmur present. Systolic murmur present with a grade of 3/6. Diastolic murmur present with a grade of 2/4. No friction rub.  Pulmonary:     Effort: Pulmonary effort is normal. No respiratory distress.     Breath sounds: Normal breath sounds. No wheezing or rales.  Abdominal:     General: There is no distension.     Palpations: Abdomen is soft.     Tenderness: There is no abdominal tenderness.  Musculoskeletal:     Cervical back: Neck supple.  Skin:    General: Skin is warm.     Capillary Refill: Capillary refill takes less than 2 seconds.  Neurological:     Mental Status: He is alert and oriented to person, place, and time.     Motor: No abnormal muscle tone.       ____________________________________________   LABS (all labs ordered are listed, but only abnormal results are displayed)  Labs Reviewed  BASIC METABOLIC PANEL - Abnormal; Notable for the following components:      Result Value   Sodium 133 (*)    Glucose, Bld 171 (*)    Calcium 8.6 (*)    GFR calc non Af Amer 59 (*)    All other components within normal limits  CBC - Abnormal; Notable for the following components:   RBC 3.48 (*)    Hemoglobin 11.1 (*)    HCT 33.2 (*)    All other components within normal limits  URINALYSIS, COMPLETE (UACMP) WITH MICROSCOPIC - Abnormal; Notable  for the following components:   Color, Urine YELLOW (*)    APPearance CLEAR (*)    All other components within normal limits  GLUCOSE, CAPILLARY - Abnormal; Notable for the following components:   Glucose-Capillary 158 (*)    All other components within normal limits  GLUCOSE, CAPILLARY - Abnormal; Notable for the following components:   Glucose-Capillary 157 (*)    All other components within normal limits  SARS CORONAVIRUS 2 (TAT 6-24 HRS)  HEMOGLOBIN [id number]  BASIC METABOLIC PANEL  CBC  CBG MONITORING, ED  TROPONIN I (HIGH SENSITIVITY)    ____________________________________________  EKG: Normal sinus rhythm, VR 51. QRS 160, QTc 474. RBBB and LAFB, with LVH. No acute ST elevation or depression. ________________________________________  RADIOLOGY All imaging, including plain films, CT scans, and ultrasounds, independently reviewed by me, and interpretations confirmed via formal radiology reads.  ED MD interpretation:   CXR: Clear, no acute abnormality  Official radiology report(s): DG Chest 2 View  Result Date: 01/16/2020 CLINICAL DATA:  Weakness EXAM: CHEST - 2 VIEW COMPARISON:  10/21/2018 FINDINGS: Heart is normal size. Aortic atherosclerosis. Mediastinal contours are within normal limits. No confluent opacities or effusions. No acute bony abnormality. IMPRESSION: No active cardiopulmonary disease. Electronically Signed   By: Rolm Baptise M.D.   On: 01/16/2020 15:02    ____________________________________________  PROCEDURES   Procedure(s) performed (including Critical Care):  .1-3 Lead EKG Interpretation Performed by: Duffy Bruce, MD Authorized by: Duffy Bruce, MD     Interpretation: normal     ECG rate:  50   ECG rate assessment: bradycardic     Rhythm: sinus bradycardia     Ectopy: none     Conduction: normal  Comments:     Indication: syncope    ____________________________________________  INITIAL IMPRESSION / MDM / ASSESSMENT AND PLAN / ED  COURSE  As part of my medical decision making, I reviewed the following data within the [place] notes reviewed and incorporated, Old chart reviewed, Notes from prior ED visits, and [place] Controlled Substance Database       *Gregory Padilla was evaluated in Emergency Department on 01/16/2020 for the symptoms described in the history of present illness. He was evaluated in the context of the global COVID-19 pandemic, which necessitated consideration that the patient might be at risk for infection with the SARS-CoV-2 virus that causes COVID-19. Institutional protocols and algorithms that pertain to the evaluation of patients at risk for COVID-19 are in a state of rapid change based on information released by regulatory bodies including the CDC and federal and state organizations. These policies and algorithms were followed during the patient's care in the ED.  Some ED evaluations and interventions may be delayed as a result of limited staffing during the pandemic.*     Medical Decision Making:  86 yo M here with syncopal episode today. On exam, he has a significant systolic and diastolic murmur, with markedly wide pulse pressure. Prior TTE showed severe AS and AR. Labs, imaging are otherwise unremarkable. There was no significant head trauma. No focal neuro deficits. No arrhythmia on telemetry. Will admit for gentle hydration, monitoring, likely TTE.  ____________________________________________  FINAL CLINICAL IMPRESSION(S) / ED DIAGNOSES  Final diagnoses:  Syncope and collapse  Aortic valve stenosis, etiology of cardiac valve disease unspecified     MEDICATIONS GIVEN DURING THIS VISIT:  Medications  insulin aspart (novoLOG) injection 0-9 Units (0 Units Subcutaneous Not Given 01/16/20 1826)  insulin aspart (novoLOG) injection 0-5 Units (has no administration in time range)  sodium chloride flush (NS) 0.9 % injection 3 mL (has no administration in time range)    enoxaparin (LOVENOX) injection 40 mg (has no administration in time range)  acetaminophen (TYLENOL) tablet 650 mg (has no administration in time range)    Or  acetaminophen (TYLENOL) suppository 650 mg (has no administration in time range)  ondansetron (ZOFRAN) tablet 4 mg (has no administration in time range)    Or  ondansetron (ZOFRAN) injection 4 mg (has no administration in time range)  hydrALAZINE (APRESOLINE) injection 5 mg (has no administration in time range)  0.9 %  sodium chloride infusion ( Intravenous New Bag/Given 01/16/20 1825)  rosuvastatin (CRESTOR) tablet 5 mg (has no administration in time range)  ALPRAZolam (XANAX) tablet 0.125 mg (has no administration in time range)  donepezil (ARICEPT) tablet 10 mg (has no administration in time range)  DULoxetine (CYMBALTA) DR capsule 30 mg (has no administration in time range)  memantine (NAMENDA) tablet 10 mg (has no administration in time range)  traZODone (DESYREL) tablet 50 mg (has no administration in time range)  pantoprazole (PROTONIX) EC tablet 40 mg (has no administration in time range)  psyllium (METAMUCIL) 58.6 % powder 1 packet (has no administration in time range)  mirabegron ER (MYRBETRIQ) tablet 50 mg (has no administration in time range)  clopidogrel (PLAVIX) tablet 75 mg (has no administration in time range)  Melatonin TABS 10 mg (has no administration in time range)  ascorbic acid (VITAMIN C) 500 MG/5ML syrup 1,000 mg (has no administration in time range)  calcium citrate-vitamin D 200-200 MG-UNIT per tablet 1 tablet (has no administration in time range)  multivitamin with minerals tablet 1 tablet (  has no administration in time range)  ipratropium (ATROVENT) 0.06 % nasal spray 1 spray (has no administration in time range)  sodium chloride flush (NS) 0.9 % injection 3 mL (3 mLs Intravenous Given 01/16/20 1356)     ED Discharge Orders    None       Note:  This document was prepared using Dragon voice recognition  software and may include unintentional dictation errors.   Duffy Bruce, MD 01/16/20 [phone]

## 2020-01-16 NOTE — H&P (Signed)
History and Physical    Gregory Padilla [id number] DOB: 07-08-1925 DOA: 01/16/2020  Referring MD/NP/PA:   PCP: [place] Pink, MD   Patient coming from:  The patient is coming from home.  At baseline, pt is independent for most of ADL.        Chief Complaint: Loss of consciousness  HPI: Gregory Padilla is a 86 y.o. male with medical history significant of hypertension, hyperlipidemia, diabetes mellitus, stroke, GERD, depression with anxiety, Mnire's disease, coccidioidomycosis, anemia, dementia, carotid artery occlusion, who presents with a loss of consciousness.  Per his son who is at bedside, patient has 1 episode of loss of consciousness when he was sitting in her kitchen table . The patient states that during the events, he could still hear caregivers talking, but he could not move because of weakness.  Patient does not have unilateral numbness or tingling in his extremities.  No facial droop or slurred speech.  Patient denies chest pain, cough, shortness of breath, fever or chills.  No nausea, vomiting, diarrhea, abdominal pain, symptoms of UTI.  ED Course: pt was found to have WBC 6.2, negative troponin, negative urinalysis, electrolytes renal function okay, pending COVID-19 PCR, temperature normal, blood pressure 169/66, heart rate 56, RR 15, oxygen saturation 100% on room air.  Chest x-ray negative.  Patient is placed on MedSurg bed for observation.  Review of Systems:   General: no fevers, chills, no body weight gain, has fatigue HEENT: no blurry vision, hearing changes or sore throat Respiratory: no dyspnea, coughing, wheezing CV: no chest pain, no palpitations GI: no nausea, vomiting, abdominal pain, diarrhea, constipation GU: no dysuria, burning on urination, increased urinary frequency, hematuria  Ext: no leg edema Neuro: no unilateral weakness, numbness, or tingling, no vision change or hearing loss. Has syncope Skin: no rash, no skin tear. MSK: No muscle spasm,  no deformity, no limitation of range of movement in spin Heme: No easy bruising.  Travel history: No recent long distant travel.  Allergy:  Allergies  Allergen Reactions  . Amitriptyline     unknown    Past Medical History:  Diagnosis Date  . Anemia    while in the [facility] as a young man  . Arthritis   . BPH (benign prostatic hyperplasia)   . Carotid artery occlusion   . Coccidioidomycosis   . Constipation   . CVA (cerebral vascular accident) ([place])    left sided weakness  . Degenerative joint disease   . Diabetes mellitus age 30   no insulin  . Hyperlipidemia   . Hypertension   . Meniere disease   . Skin cancer    history of skin cancer  . Stroke [facility])     Past Surgical History:  Procedure Laterality Date  . BACK SURGERY  2004  . CHOLECYSTECTOMY    . PROSTATE SURGERY     x2  . TRANSCAROTID ARTERY REVASCULARIZATION Left 12/13/2018   Procedure: TRANSCAROTID ARTERY REVASCULARIZATION;  Surgeon: Waynetta Sandy, MD;  Location: [facility];  Service: Vascular;  Laterality: Left;    Social History:  reports that he has never smoked. He has never used smokeless tobacco. He reports that he does not drink alcohol or use drugs.  Family History:  Family History  Problem Relation Age of Onset  . Diabetes Mother   . Arthritis Mother   . Coronary artery disease Father   . Heart disease Father   . Stroke Neg Hx      Prior to Admission medications  Medication Sig Start Date End Date Taking? Authorizing Provider  acetaminophen (TYLENOL) 500 MG tablet Take 1,000 mg by mouth every 6 (six) hours as needed for moderate pain.     [provider]  ALPRAZolam (XANAX) 0.25 MG tablet Take 0.0625 mg by mouth at bedtime as needed for anxiety.     [provider]  Ascorbic Acid (VITAMIN C PO) Take 1,000 mg by mouth daily.     [provider]  calcium citrate-vitamin D 200-200 MG-UNIT TABS Take 1 tablet by mouth 2 (two) times daily.      [provider]  clopidogrel (PLAVIX) 75 MG tablet Take 75 mg by mouth daily.      [provider]  donepezil (ARICEPT) 10 MG tablet Take 10 mg by mouth at bedtime.  01/25/16   [provider]  DULoxetine (CYMBALTA) 30 MG capsule Take 30 mg by mouth daily.    [provider]  esomeprazole (NEXIUM) 40 MG capsule Take 40 mg by mouth daily before breakfast.      [provider]  ipratropium (ATROVENT) 0.06 % nasal spray Place 1 spray into both nostrils 2 (two) times daily as needed for rhinitis.     [provider]  Linaclotide Rolan Lipa) 145 MCG CAPS capsule Take 1 capsule (145 mcg total) by mouth daily. 09/03/13   Milus Banister, MD  Magnesium 250 MG TABS Take 250 mg by mouth daily.    [provider]  Melatonin 10 MG TABS Take 10 mg by mouth at bedtime.    [provider]  metFORMIN (GLUCOPHAGE) 500 MG tablet Take 1 tablet (500 mg total) by mouth 2 (two) times daily with a meal. 01/29/15   Ghimire, Henreitta Leber, MD  mirabegron ER (MYRBETRIQ) 50 MG TB24 tablet Take 50 mg by mouth 2 (two) times daily.     [provider]  Multiple Vitamin (MULTIVITAMIN WITH MINERALS) TABS tablet Take 1 tablet by mouth daily.    [provider]  OVER THE COUNTER MEDICATION Take 1 tablet by mouth daily. Hyland's Leg Cramp    [provider]  Polyethyl Glycol-Propyl Glycol (SYSTANE OP) Place 1 drop into both eyes 3 (three) times daily as needed (dry eyes).     [provider]  Polyethyl Glycol-Propyl Glycol 0.4-0.3 % GEL Place 1 application into both eyes at bedtime.     [provider]  polyethylene glycol powder (MIRALAX) powder Take 17 g by mouth daily.     [provider]  Potassium 99 MG TABS Take 99 mg by mouth daily.    [provider]  psyllium (METAMUCIL) 58.6 % powder Take 1 packet by mouth 2 (two) times daily.     [provider]  rosuvastatin (CRESTOR) 5 MG tablet Take 5 mg by  mouth daily.      [provider]  traMADol (ULTRAM) 50 MG tablet Take 1 tablet (50 mg total) by mouth every 6 (six) hours as needed for moderate pain. 12/14/18   Ulyses Amor, PA-C  traZODone (DESYREL) 50 MG tablet Take 50 mg by mouth at bedtime.    [provider]    Physical Exam: Vitals:   01/16/20 1300 01/16/20 1330 01/16/20 1530 01/16/20 1745  BP: (!) 133/59 (!) 169/66 (!) 138/59 (!) 187/70  Pulse: (!) 57 (!) 56 61 (!) 52  Resp: 16 15 13 14   Temp:    98.3 F (36.8 C)  TempSrc:    Oral  SpO2: 100% 100% 100%  100%  Weight:      Height:       General: Not in acute distress HEENT:       Eyes: PERRL, EOMI, no scleral icterus.       ENT: No discharge from the ears and nose, no pharynx injection, no tonsillar enlargement.        Neck: No JVD, no bruit, no mass felt. Heme: No neck lymph node enlargement. Cardiac: [id number], RRR, 3/6 systolic murmurs, No gallops or rubs. Respiratory: No rales, wheezing, rhonchi or rubs. GI: Soft, nondistended, nontender, no rebound pain, no organomegaly, BS present. GU: No hematuria Ext: No pitting leg edema bilaterally. 2+DP/PT pulse bilaterally. Musculoskeletal: No joint deformities, No joint redness or warmth, no limitation of ROM in spin. Skin: No rashes.  Neuro: Alert, oriented X3, cranial nerves II-XII grossly intact, moves all extremities normally.  Psych: Patient is not psychotic, no suicidal or hemocidal ideation.  Labs on Admission: I have personally reviewed following labs and imaging studies  CBC: Recent Labs  Lab 01/16/20 1231  WBC 6.2  HGB 11.1*  HCT 33.2*  MCV 95.4  PLT [id number]   Basic Metabolic Panel: Recent Labs  Lab 01/16/20 1231  NA 133*  K 4.7  CL 99  CO2 24  GLUCOSE 171*  BUN 10  CREATININE 1.09  CALCIUM 8.6*   GFR: Estimated Creatinine Clearance: 49.9 mL/min (by C-G formula based on SCr of 1.09 mg/dL). Liver Function Tests: No results for input(s): AST, ALT, ALKPHOS, BILITOT, PROT, ALBUMIN in  the last 168 hours. No results for input(s): LIPASE, AMYLASE in the last 168 hours. No results for input(s): AMMONIA in the last 168 hours. Coagulation Profile: No results for input(s): INR, PROTIME in the last 168 hours. Cardiac Enzymes: No results for input(s): CKTOTAL, CKMB, CKMBINDEX, TROPONINI in the last 168 hours. BNP (last 3 results) No results for input(s): PROBNP in the last 8760 hours. HbA1C: No results for input(s): HGBA1C in the last 72 hours. CBG: Recent Labs  Lab 01/16/20 1411  GLUCAP 158*   Lipid Profile: No results for input(s): CHOL, HDL, LDLCALC, TRIG, CHOLHDL, LDLDIRECT in the last 72 hours. Thyroid Function Tests: No results for input(s): TSH, T4TOTAL, FREET4, T3FREE, THYROIDAB in the last 72 hours. Anemia Panel: No results for input(s): VITAMINB12, FOLATE, FERRITIN, TIBC, IRON, RETICCTPCT in the last 72 hours. Urine analysis:    Component Value Date/Time   COLORURINE YELLOW (A) 01/16/2020 1356   APPEARANCEUR CLEAR (A) 01/16/2020 1356   APPEARANCEUR Clear 06/11/2013 1402   LABSPEC 1.010 01/16/2020 1356   LABSPEC 1.006 06/11/2013 1402   PHURINE 8.0 01/16/2020 1356   GLUCOSEU NEGATIVE 01/16/2020 1356   GLUCOSEU Negative 06/11/2013 1402   HGBUR NEGATIVE 01/16/2020 1356   BILIRUBINUR NEGATIVE 01/16/2020 1356   BILIRUBINUR Negative 06/11/2013 1402   KETONESUR NEGATIVE 01/16/2020 1356   PROTEINUR NEGATIVE 01/16/2020 1356   UROBILINOGEN 1.0 01/27/2015 0233   NITRITE NEGATIVE 01/16/2020 1356   LEUKOCYTESUR NEGATIVE 01/16/2020 1356   LEUKOCYTESUR Negative 06/11/2013 1402   Sepsis Labs: @LABRCNTIP (procalcitonin:4,lacticidven:4) )No results found for this or any previous visit (from the past 240 hour(s)).   Radiological Exams on Admission: DG Chest 2 View  Result Date: 01/16/2020 CLINICAL DATA:  Weakness EXAM: CHEST - 2 VIEW COMPARISON:  10/21/2018 FINDINGS: Heart is normal size. Aortic atherosclerosis. Mediastinal contours are within normal limits. No  confluent opacities or effusions. No acute bony abnormality. IMPRESSION: No active cardiopulmonary disease. Electronically Signed   By: Rolm Baptise M.D.   On: 01/16/2020 15:02  EKG: Independently reviewed.  Sinus rhythm, QTC 474, old bifascicular block, poor R wave progression  Assessment/Plan Principal Problem:   Syncope Active Problems:   Diabetes mellitus without complication (HCC)   Stroke (HCC)   Hyperlipidemia   Hypertension   BPH (benign prostatic hyperplasia)   Depression with anxiety   Syncope: Etiology is not clear. The differential diagnosis is broad, including vasovagal syncope, TIA/stroke, orthostatic status, carotid artery stenosis, aortic stenosis given 3/6 systolic murmur.  - Place on med-surg bed for obs - Orthostatic vital signs  - Carotid doppler, MRI-brain - 2d echo - Neuro checks  - IVF: NS 75 cc/h - PT/OT eval and treat  Diabetes mellitus without complication ([place]): Most recent A1c7.7 on 01/28/15 , poorly controled. Patient is taking Metformin at home -SSI  Hx of  Stroke [facility]): -plavix and crestor  Hyperlipidemia -Crestor  HTN:  Not taking meds at home -hydralazine prn  BPH (benign prostatic hyperplasia): - Myrbetriq  Depression and anxiety: Stable, no suicidal or homicidal ideations. -Continue home medications   DVT ppx: SQ Lovenox Code Status: DNR (I discussed with patient in the presence of his son, and explained the meaning of CODE STATUS. Patient wants to be DNR) Family Communication:  Yes, patient's son  at bed side Disposition Plan:  Anticipate discharge back to previous home environment Consults called:  none Admission status: Med-surg bed for obs    Date of Service 01/16/2020    [facility]   If 7PM-7AM, please contact night-coverage [email or web address] 01/16/2020, 6:13 PM

## 2020-01-16 NOTE — ED Notes (Signed)
Pt to xray

## 2020-01-17 ENCOUNTER — Observation Stay
Admit: 2020-01-17 | Discharge: 2020-01-17 | Disposition: A | Discharge status: Home / Self Care | Payer: Medicare Other | Attending: Internal Medicine | Admitting: Internal Medicine

## 2020-01-17 ENCOUNTER — Observation Stay: Admit: 2020-01-17 | Payer: Medicare Other

## 2020-01-17 DIAGNOSIS — E785 Hyperlipidemia, unspecified: Secondary | ICD-10-CM | POA: Diagnosis not present

## 2020-01-17 DIAGNOSIS — N400 Enlarged prostate without lower urinary tract symptoms: Secondary | ICD-10-CM

## 2020-01-17 DIAGNOSIS — I350 Nonrheumatic aortic (valve) stenosis: Secondary | ICD-10-CM | POA: Diagnosis not present

## 2020-01-17 DIAGNOSIS — R55 Syncope and collapse: Secondary | ICD-10-CM | POA: Diagnosis not present

## 2020-01-17 LAB — GLUCOSE, CAPILLARY
Glucose-Capillary: 118 mg/dL — ABNORMAL HIGH (ref 70–99)
Glucose-Capillary: 153 mg/dL — ABNORMAL HIGH (ref 70–99)

## 2020-01-17 LAB — CBC
HCT: 34 % — ABNORMAL LOW (ref 39.0–52.0)
Hemoglobin: 11.9 g/dL — ABNORMAL LOW (ref 13.0–17.0)
MCH: 32.3 pg (ref 26.0–34.0)
MCHC: 35 g/dL (ref 30.0–36.0)
MCV: 92.4 fL (ref 80.0–100.0)
Platelets: 217 10*3/uL (ref 150–400)
RBC: 3.68 MIL/uL — ABNORMAL LOW (ref 4.22–5.81)
RDW: 11.8 % (ref 11.5–15.5)
WBC: 8.3 10*3/uL (ref 4.0–10.5)
nRBC: 0 % (ref 0.0–0.2)

## 2020-01-17 LAB — BASIC METABOLIC PANEL
Anion gap: 7 (ref 5–15)
BUN: 11 mg/dL (ref 8–23)
CO2: 23 mmol/L (ref 22–32)
Calcium: 9 mg/dL (ref 8.9–10.3)
Chloride: 104 mmol/L (ref 98–111)
Creatinine, Ser: 0.97 mg/dL (ref 0.61–1.24)
GFR calc Af Amer: >60 mL/min (ref >60)
GFR calc non Af Amer: >60 mL/min (ref >60)
Glucose, Bld: 124 mg/dL — ABNORMAL HIGH (ref 70–99)
Potassium: 4 mmol/L (ref 3.5–5.1)
Sodium: 134 mmol/L — ABNORMAL LOW (ref 135–145)

## 2020-01-17 MED ORDER — ACETAMINOPHEN 325 MG PO TABS
650.0000 mg | ORAL_TABLET | Freq: Four times a day (QID) | ORAL | Status: DC | PRN
  Administered 2020-01-17: 14:00:00 650 mg via ORAL

## 2020-01-17 MED ORDER — POLYVINYL ALCOHOL 1.4 % OP SOLN
2.0000 [drp] | OPHTHALMIC | Status: DC | PRN
  Filled 2020-01-17: qty 15

## 2020-01-17 NOTE — Progress Notes (Signed)
Patient suffers from syncope, and weakness which impairs their ability to perform daily activities like ADLs in the home.  A walking aid will not resolve  issue with performing activities of daily living. A transport wheelchair will allow patient to safely perform daily activities. Patient is not able to propel themselves in the home using a standard weight wheelchair or light weight wheelchair due to weakness and syncope. Patient can self propel in the lightweight transport wheelchair. Length of need 99 months. Accessories: elevating leg rests , wheel locks, extensions and anti-tippers and back cushion

## 2020-01-17 NOTE — Progress Notes (Signed)
*  PRELIMINARY RESULTS* Echocardiogram 2D Echocardiogram has been performed.  Gregory Padilla 01/17/2020, 3:18 PM

## 2020-01-17 NOTE — Progress Notes (Signed)
Discharge papers provided to patient's son Jori Moll. IV discontinued without difficulty. Patient assisted to wheelchair by nursing and wheeled to [facility] for discharge.

## 2020-01-17 NOTE — Evaluation (Signed)
Occupational Therapy Evaluation Patient Details Name: Gregory Padilla MRN: [id number] DOB: 01-10-1925 Today's Date: 01/17/2020    History of Present Illness ABDIKADIR Padilla is a 86 y.o. male with medical history significant of hypertension, hyperlipidemia, diabetes mellitus, stroke, GERD, depression with anxiety, Mnire's disease, coccidioidomycosis, anemia, dementia, carotid artery occlusion, who presents with a loss of consciousness at home.   Clinical Impression   Patient in ED bed upon entry with breakfast out of reach.  Assisted patient with set up for self feeding.  Pt is unreliable historian due to some memory deficits.  Son present to discuss PLOF.  Patient required assistance for all self care with care aides from [place].  Patient lives alone with his wife who has dementia.  Per pt and son, he utilized w/c for functional mobility and completed SPT and sit<>stand transfers at MOD I/SPV.  Patient currently presents with increased generalized weakness and poor activity tolerance affecting his overall safety and level of independence.  Pt able to sit at EOB with MOD A and increased time.  R LE noted to "spasm" when attempting to move or bear weight through.  Unable to reach full standing without UE assistance (I.e. RW).  Patient would benefit from continued occupational therapy services to address deficits in performance components outlined below.  Based on today's performance, recommending SNF for further rehabilitation.  Pt's son states he wishes to take him home with current assist availability, but patient would benefit from 24/7 SPV/assist due to increased need for care.     Follow Up Recommendations  SNF;Supervision/Assistance - 24 hour    Equipment Recommendations  Other (comment)(defer to next level of care)    Recommendations for Other Services       Precautions / Restrictions Precautions Precautions: Fall Restrictions Weight Bearing Restrictions: No      Mobility Bed  Mobility Overal bed mobility: Needs Assistance Bed Mobility: Rolling;Supine to Sit;Sit to Supine Rolling: Min assist   Supine to sit: Mod assist Sit to supine: Mod assist   General bed mobility comments: On ED bed  Transfers Overall transfer level: Needs assistance   Transfers: Sit to/from Stand           General transfer comment: Unable to reach full stand without use of RW.  R LE spasming when attempting to WB.    Balance Overall balance assessment: Needs assistance                                         ADL either performed or assessed with clinical judgement   ADL Overall ADL's : Needs assistance/impaired                                       General ADL Comments: Baseline requires assistance for all self care.  Patient is weaker at this time after syncopal episode, thus requiring more assistance MOD/MAX A.     Vision Patient Visual Report: No change from baseline       Perception     Praxis      Pertinent Vitals/Pain Pain Assessment: Faces Faces Pain Scale: Hurts little more Pain Location: R hip/leg Pain Descriptors / Indicators: Aching;Cramping;Discomfort;Grimacing;Spasm;Sore Pain Intervention(s): Limited activity within patient's tolerance;Monitored during session;Repositioned     Hand Dominance Right   Extremity/Trunk Assessment Upper Extremity Assessment Upper Extremity Assessment:  Generalized weakness;RUE deficits/detail RUE Deficits / Details: R hand MCP noted to be swollen and red. difficulty with gross graps and managing utensils during meal time.   Lower Extremity Assessment Lower Extremity Assessment: Defer to PT evaluation;RLE deficits/detail RLE Deficits / Details: Patient reports R hip pain.  R leg demonstrates some spasm when attemping to weight bear and completed bed mobility.       Communication Communication Communication: HOH   Cognition Arousal/Alertness: Awake/alert Behavior During Therapy:  WFL for tasks assessed/performed Overall Cognitive Status: History of cognitive impairments - at baseline                                 General Comments: Patient is oriented to self, situation and place.  Difficulty with memory/recall.   General Comments       Exercises Other Exercises Other Exercises: Provided education on goals and role of OT in acute care setting Other Exercises: Completed bed mobility and attempts to stand.  Unable to reach full sit<>stand without UE support. Other Exercises: Education provided on benefits of continued therapy and need for more assistance within the home upon d/c   Shoulder Instructions      Home Living Family/patient expects to be discharged to:: Private residence Living Arrangements: Spouse/significant other(Wife has dementia)                           Home Equipment: Cane - single point;Walker - 2 wheels;Bedside commode;Tub bench;Wheelchair - Banker          Prior Functioning/Environment Level of Independence: Needs assistance  Gait / Transfers Assistance Needed: Pt reports he utilized manual w/c for mobility and completes SPTs only. ADL's / Homemaking Assistance Needed: Patient has assistance from [place].  Help on M/T/R 3 hours and W/F 2 hours.  Assist patient with bathing and homemaking.   Comments: Patient has 2 sons.  One lives next door and the other lives ~20 miles away.  Provide intermittent assistance as able.        OT Problem List: Decreased strength;Decreased activity tolerance;Impaired balance (sitting and/or standing);Decreased safety awareness;Decreased knowledge of use of DME or AE      OT Treatment/Interventions: Self-care/ADL training;Therapeutic exercise;Energy conservation;DME and/or AE instruction;Therapeutic activities;Patient/family education    OT Goals(Current goals can be found in the care plan section) Acute Rehab OT Goals Patient Stated Goal: "feel better" OT Goal  Formulation: With patient Time For Goal Achievement: 01/31/20 Potential to Achieve Goals: Good  OT Frequency: Min 1X/week   Barriers to D/C: Decreased caregiver support  Patient requiring increased SPV/assist secondary to weakness       Co-evaluation              AM-PAC OT "6 Clicks" Daily Activity     Outcome Measure Help from another person eating meals?: A Little Help from another person taking care of personal grooming?: A Little Help from another person toileting, which includes using toliet, bedpan, or urinal?: A Lot Help from another person bathing (including washing, rinsing, drying)?: A Lot Help from another person to put on and taking off regular upper body clothing?: A Lot Help from another person to put on and taking off regular lower body clothing?: A Lot 6 Click Score: 14   End of Session Equipment Utilized During Treatment: Gait belt  Activity Tolerance: Patient tolerated treatment well Patient left: in bed;with call bell/phone within reach;with family/visitor present  OT Visit Diagnosis: Unsteadiness on feet (R26.81);Muscle weakness (generalized) (M62.81);History of falling (Z91.81)                Time: 0920-1005 OT Time Calculation (min): 45 min Charges:  OT General Charges $OT Visit: 1 Visit OT Evaluation $OT Eval Moderate Complexity: 1 Mod OT Treatments $Self Care/Home Management : 8-22 mins $Therapeutic Activity: 23-37 mins  Baldomero Lamy, MS, OTR/L 01/17/20, 1:37 PM

## 2020-01-17 NOTE — Discharge Summary (Signed)
[facility] NAME: Gregory Padilla    MR#:  [id number]  DATE OF BIRTH:  1925-06-30  DATE OF ADMISSION:  01/16/2020 ADMITTING PHYSICIAN: Gregory Costa, MD  DATE OF DISCHARGE: 01/17/2020  PRIMARY CARE PHYSICIAN: Gregory Pink, MD    ADMISSION DIAGNOSIS:  Syncope and collapse [R55] Syncope [R55] Aortic valve stenosis, etiology of cardiac valve disease unspecified [I35.0]  DISCHARGE DIAGNOSIS:  Syncopal episode suspected due to known Severe AS/Generalized weakness/deconditioning Chronic Left Hip Pain due to DJD SECONDARY DIAGNOSIS:   Past Medical History:  Diagnosis Date  . Anemia    while in the [employer] as a young man  . Arthritis   . BPH (benign prostatic hyperplasia)   . Carotid artery occlusion   . Coccidioidomycosis   . Constipation   . CVA (cerebral vascular accident) ([place])    left sided weakness  . Degenerative joint disease   . Diabetes mellitus age 63   no insulin  . Hyperlipidemia   . Hypertension   . Meniere disease   . Skin cancer    history of skin cancer  . Stroke [facility])     HOSPITAL COURSE:   Gregory Padilla is a 86 y.o. male with medical history significant of hypertension, hyperlipidemia, diabetes mellitus, stroke, GERD, depression with anxiety, Mnire's disease, coccidioidomycosis, anemia, dementia, carotid artery occlusion, who presents with a loss of consciousness.The patient states that during the events, he could still hear caregivers talking, but he could not move because of weakness.  Syncope:  suspected due to generalized weakness/deconditioning in the setting of severe known aortic stenosis  patient psycho from March 2020 was reviewed. He has severe aortic stenosis with moderate mitral calcification and regurgitation.  -Discussed with patient's son Gregory Padilla. According to the son patient has been wheelchair-bound for a while. He feels patient is clear overall deteriorating with  generalized weakness. Patient currently due to his age and other comorbidities family understands patient not a candidate for any procedure. -Hemodynamically otherwise stable. Blood pressure little bit on the higher side due to left hip pain from DJD. Discussed with son will not be starting any antihypertensives. They will monitor blood pressure at home discussed with PCP as outpatient. -Tylenol given -patient was seen by physical therapy given his left hip pain and generalized weakness recommends rehab. Discussed with son and he wishes to take patient home. Patient has caregivers come in several hours on most days of the week. Home health has been arranged. -Son does understand patient is at risk of having these episodes again. -[facility] -MRI brain negative for CVA -home health PT RN and transport chair arranged. -Son is in agreement with plan.  Diabetes mellitus without complication ([place]): Most recent A1c7.7 on 01/28/15 , poorly controled. Patient is taking Metformin at home -SSI  Hx of  Stroke [facility]): -plavix and crestor  Hyperlipidemia -Crestor  HTN:  Not taking meds at home -hydralazine prn  BPH (benign prostatic hyperplasia): - Myrbetriq  Depression and anxiety: Stable, no suicidal or homicidal ideations. -Continue home medications   DVT ppx: SQ Lovenox Code Status: DNR (I discussed with patient in the presence of his son, and explained the meaning of CODE STATUS. Patient wants to be DNR) Family Communication:  Yes, patient's son  at bed side Disposition Plan:  Anticipate discharge back to previous home environment today -TOC for discharge planning. Consults called:  none CONSULTS OBTAINED:    DRUG ALLERGIES:  Allergies  Allergen Reactions  . Amitriptyline     unknown    DISCHARGE MEDICATIONS:   Allergies as of 01/17/2020      Reactions   Amitriptyline    unknown      Medication List    TAKE these medications    acetaminophen 500 MG tablet Commonly known as: TYLENOL Take 1,000 mg by mouth every 6 (six) hours as needed for moderate pain.   ALPRAZolam 0.25 MG tablet Commonly known as: XANAX Take 0.0625 mg by mouth at bedtime as needed for anxiety.   calcium citrate-vitamin D 200-200 MG-UNIT Tabs Take 1 tablet by mouth 2 (two) times daily.   clopidogrel 75 MG tablet Commonly known as: PLAVIX Take 75 mg by mouth daily.   donepezil 10 MG tablet Commonly known as: ARICEPT Take 10 mg by mouth at bedtime.   DULoxetine 30 MG capsule Commonly known as: CYMBALTA Take 30 mg by mouth daily.   esomeprazole 40 MG capsule Commonly known as: NEXIUM Take 40 mg by mouth daily before breakfast.   ipratropium 0.06 % nasal spray Commonly known as: ATROVENT Place 1 spray into both nostrils 2 (two) times daily as needed for rhinitis.   Melatonin 10 MG Tabs Take 10 mg by mouth at bedtime.   memantine 10 MG tablet Commonly known as: NAMENDA Take 10 mg by mouth 2 (two) times daily.   mirabegron ER 50 MG Tb24 tablet Commonly known as: MYRBETRIQ Take 50 mg by mouth 2 (two) times daily.   multivitamin with minerals Tabs tablet Take 1 tablet by mouth daily.   Potassium 99 MG Tabs Take 99 mg by mouth daily.   psyllium 58.6 % powder Commonly known as: METAMUCIL Take 1 packet by mouth 2 (two) times daily.   rosuvastatin 5 MG tablet Commonly known as: CRESTOR Take 5 mg by mouth daily.   traZODone 50 MG tablet Commonly known as: DESYREL Take 50 mg by mouth at bedtime.   VITAMIN C PO Take 1,000 mg by mouth daily.            Durable Medical Equipment  (From admission, onward)         Start     Ordered   01/17/20 1439  For home use only DME Other see comment  Once    Comments: Transport chair  Question:  Length of Need  Answer:  Lifetime   01/17/20 1438          If you experience worsening of your admission symptoms, develop shortness of breath, life threatening emergency,  suicidal or homicidal thoughts you must seek medical attention immediately by calling 911 or calling your MD immediately  if symptoms less severe.  You Must read complete instructions/literature along with all the possible adverse reactions/side effects for all the Medicines you take and that have been prescribed to you. Take any new Medicines after you have completely understood and accept all the possible adverse reactions/side effects.   Please note  You were cared for by a hospitalist during your hospital stay. If you have any questions about your discharge medications or the care you received while you were in the hospital after you are discharged, you can call the unit and asked to speak with the hospitalist on call if the hospitalist that took care of you is not available. Once you are discharged, your primary care physician will handle any further medical issues. Please note that NO REFILLS for any discharge medications will be authorized once you are discharged, as  it is imperative that you return to your primary care physician (or establish a relationship with a primary care physician if you do not have one) for your aftercare needs so that they can reassess your need for medications and monitor your lab values. Today   SUBJECTIVE   Patient hard on hearing. Complains of left hip pain which has been chronic. Once his Tylenol. Denies any dizziness. Ate good breakfast. Son in the room at bedside.  VITAL SIGNS:  Blood pressure (!) 179/65, pulse (!) 52, temperature 98.2 F (36.8 C), temperature source Oral, resp. rate 18, height 6\' 2"  (1.88 m), weight 81.6 kg, SpO2 99 %.  I/O:    Intake/Output Summary (Last 24 hours) at 01/17/2020 1532 Last data filed at 01/17/2020 1036 Gross per 24 hour  Intake -  Output 875 ml  Net -875 ml    PHYSICAL EXAMINATION:  GENERAL:  86 y.o.-year-old patient lying in the bed with no acute distress.  EYES: Pupils equal, round, reactive to light and  accommodation. No scleral icterus.  HEENT: Head atraumatic, normocephalic. Oropharynx and nasopharynx clear. Hard on hearing NECK:  Supple, no jugular venous distention. No thyroid enlargement, no tenderness.  LUNGS: Normal breath sounds bilaterally, no wheezing, rales,rhonchi or crepitation. No use of accessory muscles of respiration.  CARDIOVASCULAR: S1, S2 normal. No murmurs, rubs, or gallops.  ABDOMEN: Soft, non-tender, non-distended. Bowel sounds present. No organomegaly or mass.  EXTREMITIES: No pedal edema, cyanosis, or clubbing.  NEUROLOGIC: limited exam. Patient moves all extremities well. No focal weakness. Generalized deconditioning and weakness PSYCHIATRIC: The patient is alert and oriented x 2.  SKIN: No obvious rash, lesion, or ulcer.   DATA REVIEW:   CBC  Recent Labs  Lab 01/17/20 0555  WBC 8.3  HGB 11.9*  HCT 34.0*  PLT 217    Chemistries  Recent Labs  Lab 01/17/20 0555  NA 134*  K 4.0  CL 104  CO2 23  GLUCOSE 124*  BUN 11  CREATININE 0.97  CALCIUM 9.0    Microbiology Results   Recent Results (from the past 240 hour(s))  SARS CORONAVIRUS 2 (TAT 6-24 HRS) Nasopharyngeal Nasopharyngeal Swab     Status: None   Collection Time: 01/16/20  5:29 PM   Specimen: Nasopharyngeal Swab  Result Value Ref Range Status   SARS Coronavirus 2 NEGATIVE NEGATIVE Final    Comment: (NOTE) SARS-CoV-2 target nucleic acids are NOT DETECTED. The SARS-CoV-2 RNA is generally detectable in upper and lower respiratory specimens during the acute phase of infection. Negative results do not preclude SARS-CoV-2 infection, do not rule out co-infections with other pathogens, and should not be used as the sole basis for treatment or other patient management decisions. Negative results must be combined with clinical observations, patient history, and epidemiological information. The expected result is Negative. Fact Sheet for Patients: SugarRoll.be Fact  Sheet for Healthcare Providers: https://www.woods-mathews.com/ This test is not yet approved or cleared by the [place] FDA and  has been authorized for detection and/or diagnosis of SARS-CoV-2 by FDA under an Emergency Use Authorization (EUA). This EUA will remain  in effect (meaning this test can be used) for the duration of the COVID-19 declaration under Section 56 4(b)(1) of the Act, 21 U.S.C. section 360bbb-3(b)(1), unless the authorization is terminated or revoked sooner. Performed at [facility], [place] [address], [place], [place] [address]     RADIOLOGY:  DG Chest 2 View  Result Date: 01/16/2020 CLINICAL DATA:  Weakness EXAM: CHEST - 2 VIEW COMPARISON:  10/21/2018 FINDINGS: Heart is normal size. Aortic atherosclerosis. Mediastinal contours are within normal limits. No confluent opacities or effusions. No acute bony abnormality. IMPRESSION: No active cardiopulmonary disease. Electronically Signed   By: Rolm Baptise M.D.   On: 01/16/2020 15:02   MR BRAIN WO CONTRAST  Result Date: 01/16/2020 CLINICAL DATA:  86 year old male with increased weakness, fatigue over the past several days. Syncope. EXAM: MRI HEAD WITHOUT CONTRAST TECHNIQUE: Multiplanar, multiecho pulse sequences of the brain and surrounding structures were obtained without intravenous contrast. COMPARISON:  Brain MRI 01/27/2015. FINDINGS: Brain: No restricted diffusion to suggest acute infarction. No midline shift, mass effect, evidence of mass lesion, ventriculomegaly, extra-axial collection or acute intracranial hemorrhage. Cervicomedullary junction and pituitary are within normal limits. Stable chronic encephalomalacia in the left parietal lobe. Stable small chronic area of encephalomalacia at the left occipital pole. Chronic lacunar infarcts in the bilateral caudate and the right posterior corona radiata. Chronic but increased a post ischemic encephalomalacia in the cerebellum since 2016, a especially left  of midline (series 15, image 16). No chronic cerebral blood products identified. Patchy and confluent bilateral cerebral white matter T2 and FLAIR hyperintensity has mildly progressed. Vascular: Major intracranial vascular flow voids are stable. Skull and upper cervical spine: Chronically advanced cervical spine degeneration and spinal stenosis redemonstrated on series 9, image 13. Roughly moderate spinal cord mass effect appears grossly stable since 2016. Bulky associated ligamentous hypertrophy about the odontoid. Visualized bone marrow signal is within normal limits. Sinuses/Orbits: Postoperative changes to both globes from the prior study. Paranasal sinuses and mastoids are stable and well pneumatized. Other: Visible internal auditory structures appear normal. Scalp and face soft tissues appear negative. IMPRESSION: 1. No acute intracranial abnormality. Chronic ischemic disease with progression in the cerebellum since 2016. 2. Chronically advanced cervical spine degeneration with multilevel spinal stenosis and spinal cord mass effect. Visible levels are grossly stable since 2016, are there myelopathic symptoms? Electronically Signed   By: Genevie Ann M.D.   On: 01/16/2020 22:24   US Carotid Bilateral  Result Date: 01/17/2020 CLINICAL DATA:  Syncopal episode. History of left-sided carotid stent placement (12/2018). History of hypertension, hyperlipidemia and diabetes. EXAM: BILATERAL CAROTID DUPLEX ULTRASOUND TECHNIQUE: Pearline Cables scale imaging, color Doppler and duplex ultrasound were performed of bilateral carotid and vertebral arteries in the neck. COMPARISON:  None. FINDINGS: Criteria: Quantification of carotid stenosis is based on velocity parameters that correlate the residual internal carotid diameter with NASCET-based stenosis levels, using the diameter of the distal internal carotid lumen as the denominator for stenosis measurement. The following velocity measurements were obtained: RIGHT ICA: 77/25 cm/sec CCA:  [id number] cm/sec SYSTOLIC ICA/CCA RATIO:  1.5 ECA: 59 cm/sec LEFT ICA: 91/22 cm/sec CCA: [id number] cm/sec SYSTOLIC ICA/CCA RATIO:  1.3 ECA: 157 cm/sec RIGHT CAROTID ARTERY: There is a minimal amount of eccentric echogenic plaque within the right carotid bulb (image 16), extending to involve the origin and proximal aspects of the right internal carotid artery (image 22), not resulting in elevated peak systolic velocities within the interrogated course the right internal carotid artery to suggest a hemodynamically significant stenosis. RIGHT VERTEBRAL ARTERY:  Antegrade flow LEFT CAROTID ARTERY: Post carotid stent placement extending from the distal aspect of the left common carotid artery (image 44) extending to the proximal aspect of the left internal carotid artery (image 53). There is a minimal amount of in-stent stenosis at the level the carotid bulb (image 47), however this does not result in elevated peak systolic velocities within the interrogated course of the left  internal carotid artery to suggest a hemodynamically significant stenosis. LEFT VERTEBRAL ARTERY:  Antegrade Flow IMPRESSION: 1. Post left-sided carotid stent placement with minimal amount of in stent stenosis, not resulting in a hemodynamically significant stenosis. 2. Minimal amount of right-sided atherosclerotic plaque, not resulting in a hemodynamically significant stenosis. Electronically Signed   By: Sandi Mariscal M.D.   On: 01/17/2020 07:45     CODE STATUS:     Code Status Orders  (From admission, onward)         Start     Ordered   01/16/20 1812  Do not attempt resuscitation (DNR)  Continuous     01/16/20 1811        Code Status History    Date Active Date Inactive Code Status Order ID Comments User Context   01/16/2020 1745 01/16/2020 1811 Full Code [id number]  Gregory Costa, MD ED   01/16/2020 1639 01/16/2020 1744 DNR [id number]  Gregory Costa, MD ED   12/13/2018 1435 12/14/2018 1345 Full Code [id number]  Ulyses Amor, PA-C Inpatient    01/27/2015 0826 01/29/2015 1956 Full Code [id number]  Gregory Costa, MD Inpatient   Advance Care Planning Activity    Advance Directive Documentation     Most Recent Value  Type of Advance Directive  Healthcare Power of Attorney  Pre-existing out of facility DNR order (yellow form or [facility]illa MOST form)  -  "MOST" Form in Place?  -       TOTAL TIME TAKING CARE OF THIS PATIENT: *40* minutes.    Fritzi Mandes M.D  [facility]    CC: Primary care physician; Gregory Pink, MD

## 2020-01-17 NOTE — Evaluation (Signed)
Physical Therapy Evaluation Patient Details Name: Gregory Padilla MRN: [id number] DOB: October 05, 1925 Today's Date: 01/17/2020   History of Present Illness  Gregory Padilla is a 86 y.o. male with medical history significant of hypertension, hyperlipidemia, diabetes mellitus, stroke, GERD, depression with anxiety, Mnire's disease, coccidioidomycosis, anemia, dementia, carotid artery occlusion, who presents with a loss of consciousness at home.  Clinical Impression  Pt is a pleasant 86 year old male who was admitted for syncopal event at home with LOC. Pt performs bed mobility/transfers with mod assist and unable to ambulate at this time. Significantly limited by hip pain, very high falls risk. Needs possible +2 for transfers to/from [facility]. Currently isn't at baseline level and has history of multiple falls. No dizziness noted with exertion. Pt demonstrates deficits with strength/pain/mobility. Would benefit from skilled PT to address above deficits and promote optimal return to PLOF; recommend transition to STR upon discharge from acute hospitalization.     Follow Up Recommendations SNF    Equipment Recommendations  (family requesting transport chair via insurance)    Recommendations for Other Services       Precautions / Restrictions Precautions Precautions: Fall Restrictions Weight Bearing Restrictions: No      Mobility  Bed Mobility Overal bed mobility: Needs Assistance Bed Mobility: Sidelying to Sit Rolling: Min assist Sidelying to sit: Min assist Supine to sit: Mod assist Sit to supine: Mod assist   General bed mobility comments: needs assist for B LE management. Once seated at EOB, able to sit with B hand support. Reports pain in L hip with mobility.  Transfers Overall transfer level: Needs assistance Equipment used: Rolling walker (2 wheeled) Transfers: Sit to/from Stand Sit to Stand: Mod assist         General transfer comment: needs assist for ant translation  over BOS. Once standing, demonstrates forward flexed posture. Unable to maintain standing longer than a few seconds. B LE buckling noted  Ambulation/Gait             General Gait Details: unable  Stairs            Wheelchair Mobility    Modified Rankin (Stroke Patients Only)       Balance Overall balance assessment: Needs assistance;History of Falls Sitting-balance support: Bilateral upper extremity supported;Feet supported Sitting balance-Leahy Scale: Fair     Standing balance support: Bilateral upper extremity supported Standing balance-Leahy Scale: Poor Standing balance comment: forward flexed posture                             Pertinent Vitals/Pain Pain Assessment: Faces Faces Pain Scale: Hurts even more Pain Location: R hip/leg Pain Descriptors / Indicators: Aching;Cramping;Discomfort;Grimacing;Spasm;Sore Pain Intervention(s): Limited activity within patient's tolerance;Repositioned    Home Living Family/patient expects to be discharged to:: Private residence Living Arrangements: Spouse/significant other(wife with dementia) Available Help at Discharge: Family(2 sons live close by) Type of Home: House Home Access: Ramped entrance     Home Layout: One level Home Equipment: Cane - single point;Walker - 2 wheels;Bedside commode;Tub bench;Wheelchair - Banker      Prior Function Level of Independence: Needs assistance   Gait / Transfers Assistance Needed: Pt reports he utilized manual w/c for mobility and completes SPTs only. Multiple recent falls and son reports recently falling every day  ADL's / Homemaking Assistance Needed: Patient has assistance from [place].  Help on M/T/R 3 hours and W/F 2 hours.  Assist patient with bathing and homemaking.  Comments: Patient has 2 sons.  One lives next door and the other lives ~20 miles away.  Provide intermittent assistance as able.     Hand Dominance   Dominant Hand: Right     Extremity/Trunk Assessment   Upper Extremity Assessment Upper Extremity Assessment: Defer to OT evaluation RUE Deficits / Details: R hand MCP noted to be swollen and red. difficulty with gross graps and managing utensils during meal time.    Lower Extremity Assessment Lower Extremity Assessment: Generalized weakness(B 3+/5; L side painful) RLE Deficits / Details: Patient reports R hip pain.  R leg demonstrates some spasm when attemping to weight bear and completed bed mobility.       Communication   Communication: HOH  Cognition Arousal/Alertness: Awake/alert Behavior During Therapy: WFL for tasks assessed/performed Overall Cognitive Status: History of cognitive impairments - at baseline                                 General Comments: Patient is oriented to self, situation and place.  Difficulty with memory/recall. Son in room helpful for providing history      General Comments      Exercises Other Exercises Other Exercises: Provided education on goals and role of OT in acute care setting Other Exercises: educated on positioning in bed for pain relief.  Other Exercises: Attempted multiple standing attempts. Spasms limited endurance and slight buckling noted. Assisted manually back to bed. Very high risk for falls   Assessment/Plan    PT Assessment Patient needs continued PT services  PT Problem List Decreased strength;Decreased activity tolerance;Decreased balance;Decreased mobility;Decreased cognition;Pain       PT Treatment Interventions Gait training;DME instruction;Therapeutic exercise;Balance training    PT Goals (Current goals can be found in the Care Plan section)  Acute Rehab PT Goals Patient Stated Goal: "feel better" PT Goal Formulation: With patient Time For Goal Achievement: 01/31/20 Potential to Achieve Goals: Good    Frequency Min 2X/week   Barriers to discharge Decreased caregiver support      Co-evaluation                AM-PAC PT "6 Clicks" Mobility  Outcome Measure Help needed turning from your back to your side while in a flat bed without using bedrails?: A Little Help needed moving from lying on your back to sitting on the side of a flat bed without using bedrails?: A Little Help needed moving to and from a bed to a chair (including a wheelchair)?: A Lot Help needed standing up from a chair using your arms (e.g., wheelchair or bedside chair)?: A Lot Help needed to walk in hospital room?: Total Help needed climbing 3-5 steps with a railing? : Total 6 Click Score: 12    End of Session Equipment Utilized During Treatment: Gait belt Activity Tolerance: Patient limited by pain Patient left: in bed;with family/visitor present Nurse Communication: Mobility status PT Visit Diagnosis: Unsteadiness on feet (R26.81);Repeated falls (R29.6);Muscle weakness (generalized) (M62.81);History of falling (Z91.81);Difficulty in walking, not elsewhere classified (R26.2);Pain Pain - Right/Left: Left Pain - part of body: Hip    Time: [id number] PT Time Calculation (min) (ACUTE ONLY): 26 min   Charges:   PT Evaluation $PT Eval Moderate Complexity: 1 Mod PT Treatments $Therapeutic Activity: 8-22 mins        Gregory Padilla, PT, DPT [phone]   Gregory Padilla 01/17/2020, 2:59 PM

## 2020-01-17 NOTE — ED Notes (Signed)
Blood sugar was 118 notified nurse Joy.

## 2020-01-17 NOTE — Progress Notes (Signed)
Per Dr. Posey Pronto, patient is being discharge and does not need telemetry.

## 2020-01-17 NOTE — TOC Transition Note (Signed)
Transition of Care [facility]) - CM/SW Discharge Note   Patient Details  Name: Gregory Padilla MRN: [id number] Date of Birth: 09/22/1925  [facility] CM/SW Contact:  Su Hilt, RN Phone Number: 01/17/2020, 2:28 PM   Clinical Narrative:     Met with the patient and his son in the room to discuss DC plan and needs, He lives at home with his wife that has dementia  He uses a Wheelchair at home and has a ramp and a Walker to use when needed, he has a BSC and raised toilet seat with grab bars He has a caregiver 3 hours a day on Mon Tue and Thur and 2 hours a day Wed and Friday His son provides transportation for him, he goes to the [place] to the Doctor and is up to date he gets his medications form the [place] as well He has used [facility] in the past and would like to use them or any agency that can accept his insurance, I called [employer] with [facility] and left a detailed VM requested a call back to accept the referral, I also sent a test to Amy requesting a call. The patient needs a light weight transport wheelchair, Brad with [employer] is aware  Final next level of care: [place] Barriers to Discharge: Barriers Resolved   Patient Goals and CMS Choice Patient states their goals for this hospitalization and ongoing recovery are:: go home      Discharge Placement                       Discharge Plan and Services   Discharge Planning Services: CM Consult            DME Arranged: Lightweight manual wheelchair with seat cushion DME Agency: [employer] Date DME Agency Contacted: 01/17/20 Time DME Agency Contacted: 51 Representative spoke with at DME Agency: [place] HH Arranged: PT, OT [place] Agency: [facility] Date [place]: 01/17/20 Time [place]: [id number] Representative spoke with at [place]: [place] ([place]) Interventions     Readmission Risk Interventions No flowsheet data  found.

## 2020-01-17 NOTE — Discharge Instructions (Signed)
Patient's son will follow-up with [facility] PCP as well.

## 2020-01-18 LAB — ECHOCARDIOGRAM COMPLETE
Height: 74 in
Weight: 2880 oz

## 2020-01-18 LAB — HEMOGLOBIN A1C
Hgb A1c MFr Bld: 6.9 % — ABNORMAL HIGH (ref 4.8–5.6)
Mean Plasma Glucose: 151 mg/dL

## 2020-03-10 ENCOUNTER — Other Ambulatory Visit: Payer: Self-pay

## 2020-03-10 ENCOUNTER — Other Ambulatory Visit: Payer: Self-pay | Admitting: Podiatry

## 2020-03-10 ENCOUNTER — Ambulatory Visit (INDEPENDENT_AMBULATORY_CARE_PROVIDER_SITE_OTHER): Payer: Medicare Other

## 2020-03-10 ENCOUNTER — Ambulatory Visit (INDEPENDENT_AMBULATORY_CARE_PROVIDER_SITE_OTHER): Payer: Medicare Other | Admitting: Podiatry

## 2020-03-10 ENCOUNTER — Encounter: Payer: Self-pay | Admitting: Podiatry

## 2020-03-10 DIAGNOSIS — K219 Gastro-esophageal reflux disease without esophagitis: Secondary | ICD-10-CM | POA: Insufficient documentation

## 2020-03-10 DIAGNOSIS — I739 Peripheral vascular disease, unspecified: Secondary | ICD-10-CM

## 2020-03-10 DIAGNOSIS — L97521 Non-pressure chronic ulcer of other part of left foot limited to breakdown of skin: Secondary | ICD-10-CM

## 2020-03-10 DIAGNOSIS — M79672 Pain in left foot: Secondary | ICD-10-CM

## 2020-03-10 NOTE — Progress Notes (Signed)
HPI: 86 y.o. male presenting today for evaluation of a lesion that developed to the lateral aspect of the patient's left foot.  Patient states that the knot came up about 2-3 months ago.  Initially it did not hurt but now it is very tender to touch.  It is tender to put socks on.  He states that he saw his PCP last Thursday and was given oral Keflex 500 mg 3 times daily x5 days.  He has also been applying mupirocin ointment to the area.  He presents for further treatment evaluation  Past Medical History:  Diagnosis Date  . Anemia    while in the [facility] as a young man  . Arthritis   . BPH (benign prostatic hyperplasia)   . Carotid artery occlusion   . Coccidioidomycosis   . Constipation   . CVA (cerebral vascular accident) ([place])    left sided weakness  . Degenerative joint disease   . Diabetes mellitus age 57   no insulin  . Hyperlipidemia   . Hypertension   . Meniere disease   . Skin cancer    history of skin cancer  . Stroke [facility]      Physical Exam: General: The patient is alert and oriented x3 in no acute distress.  Dermatology: Skin is very cold to touch.  There are 2 distinct preulcerative callus lesions noted to the left lateral foot at the MTPJ and fifth metatarsal tubercle.  They do not appear to be open at the moment.  There is no drainage.  No malodor noted.  And there is no periwound erythema that would indicate infection.  Vascular: Capillary refill delayed greater than 5 seconds.  Diminished pedal pulses bilateral.  The feet are very cold to touch bilateral.  Patient is being actively managed and treated at [facility] in [facility]  Neurological: Epicritic and protective threshold grossly intact bilaterally.   Musculoskeletal Exam: Range of motion within normal limits to all pedal and ankle joints bilateral. Muscle strength 5/5 in all groups bilateral.   Radiographic Exam:  Normal osseous mineralization. Joint spaces preserved. No  fracture/dislocation/boney destruction.  There is no evidence of osteolytic destruction or osteomyelitis specifically to the fifth metatarsal tubercle  Assessment: 1.  Superficial ulcerations limited to breakdown of skin left foot x2 2.  PVD bilateral lower extremities   Plan of Care:  1. Patient evaluated. X-Rays reviewed.  2.  Continue oral Keflex 500 mg 3 times daily x5 days as per PCP 3.  Continue mupirocin ointment daily 4.  Offloading pads were provided for the patient today to help take pressure off of the fifth metatarsal tubercle 5.  Continue vascular management by vein and vascular, Dr. Donzetta Matters 6.  Return to clinic in 4 weeks.  I did explain to the patient and his son that there are many factors associated with healing wounds.  These wounds may be slow to heal however we will continue to treat him conservatively to see if we can alleviate pressure and get the wounds to heal given the circumstances      Edrick Kins, DPM [facility]  Dr. Edrick Kins, DPM    [address], [place] [address]  Office [phone]  Fax [phone]

## 2020-04-14 ENCOUNTER — Ambulatory Visit (INDEPENDENT_AMBULATORY_CARE_PROVIDER_SITE_OTHER): Payer: Medicare Other | Admitting: Podiatry

## 2020-04-14 ENCOUNTER — Other Ambulatory Visit: Payer: Self-pay

## 2020-04-14 ENCOUNTER — Encounter: Payer: Self-pay | Admitting: Podiatry

## 2020-04-14 DIAGNOSIS — M79672 Pain in left foot: Secondary | ICD-10-CM

## 2020-04-14 DIAGNOSIS — I739 Peripheral vascular disease, unspecified: Secondary | ICD-10-CM

## 2020-04-14 DIAGNOSIS — L97521 Non-pressure chronic ulcer of other part of left foot limited to breakdown of skin: Secondary | ICD-10-CM | POA: Diagnosis not present

## 2020-04-14 MED ORDER — MUPIROCIN 2 % EX OINT: TOPICAL_OINTMENT | CUTANEOUS | 3 refills | Status: DC

## 2020-04-14 NOTE — Addendum Note (Signed)
Addended by: Edrick Kins on: 04/14/2020 05:28 PM   Modules accepted: Orders

## 2020-04-14 NOTE — Progress Notes (Signed)
HPI: 86 y.o. male presenting today for follow-up evaluation of a wound to the lateral aspect of the left foot.  The patient and his son are present today and they have not noticed any significant improvement of the lesion.  They have been applying mupirocin ointment as directed daily to the wound.  The son also states that recently his father bumped his ankle against something and it bled profusely since the patient is on anticoagulant therapy.  They were able to get the bleeding to stop and they have been applying mupirocin ointment to the ankle wound as well.  They present for further treatment and evaluation  Past Medical History:  Diagnosis Date  . Anemia    while in the [facility] as a young man  . Arthritis   . Basal cell carcinoma 05/16/2017   R post auricular  . Basal cell carcinoma 03/16/2018   R postauricualr ear in lower crease  . BPH (benign prostatic hyperplasia)   . Carotid artery occlusion   . Coccidioidomycosis   . Constipation   . CVA (cerebral vascular accident) ([place])    left sided weakness  . Degenerative joint disease   . Diabetes mellitus age 89   no insulin  . Hyperlipidemia   . Hypertension   . Meniere disease   . Skin cancer    history of skin cancer  . Stroke [facility])      Physical Exam: General: The patient is alert and oriented x3 in no acute distress.  Dermatology: Skin is very cold to touch.  Ulcer noted to the fifth metatarsal tubercle left foot as well as an ulcer just distal to the lateral malleolus of the left ankle.  Both ulcers appear stable and clinically appear the same.  There is no drainage.  No malodor noted.  And there is no periwound erythema that would indicate infection.  Wound base is granular  Vascular: Capillary refill delayed greater than 5 seconds.  Diminished pedal pulses bilateral.  The feet are very cold to touch bilateral.  Patient is being actively managed and treated at [facility] in [facility]  Neurological: Epicritic  and protective threshold grossly intact bilaterally.   Musculoskeletal Exam: Range of motion within normal limits to all pedal and ankle joints bilateral. Muscle strength 5/5 in all groups bilateral.   Radiographic Exam impression 03/10/2020:  Normal osseous mineralization. Joint spaces preserved. No fracture/dislocation/boney destruction.  There is no evidence of osteolytic destruction or osteomyelitis specifically to the fifth metatarsal tubercle  Assessment: 1.  Ulcer left foot and ankle secondary to vascular compromise and PVD 2.  PVD bilateral lower extremities   Plan of Care:  1. Patient evaluated.   2.  Medically necessary excisional debridement including subcutaneous tissue was performed using a tissue nipper.  Excisional debridement of all the necrotic nonviable tissue down to the healthy bleeding viable tissue was performed with post debridement measurement same as.   3.  Continue mupirocin ointment daily.  Refill prescription sent to the pharmacy  4.  Continue offloading pads to the fifth metatarsal tubercle.   5.  Continue vascular management by vein and vascular, Dr. Donzetta Matters 6.  Return to clinic in 6 weeks.  Again I did explain to the patient and his son that there are many factors associated with healing wounds.  These wounds may be slow to heal however we will continue to treat him conservatively to see if we can alleviate pressure and get the wounds to heal given the circumstances  Edrick Kins, DPM [facility]  Dr. Edrick Kins, DPM    [address], [place] [address]                Office [phone]  Fax [phone]

## 2020-04-15 ENCOUNTER — Ambulatory Visit (INDEPENDENT_AMBULATORY_CARE_PROVIDER_SITE_OTHER): Payer: Medicare Other | Admitting: Dermatology

## 2020-04-15 DIAGNOSIS — C44519 Basal cell carcinoma of skin of other part of trunk: Secondary | ICD-10-CM

## 2020-04-15 DIAGNOSIS — L820 Inflamed seborrheic keratosis: Secondary | ICD-10-CM | POA: Diagnosis not present

## 2020-04-15 DIAGNOSIS — L578 Other skin changes due to chronic exposure to nonionizing radiation: Secondary | ICD-10-CM | POA: Diagnosis not present

## 2020-04-15 DIAGNOSIS — D692 Other nonthrombocytopenic purpura: Secondary | ICD-10-CM

## 2020-04-15 DIAGNOSIS — C4441 Basal cell carcinoma of skin of scalp and neck: Secondary | ICD-10-CM

## 2020-04-15 DIAGNOSIS — L821 Other seborrheic keratosis: Secondary | ICD-10-CM

## 2020-04-15 DIAGNOSIS — L89892 Pressure ulcer of other site, stage 2: Secondary | ICD-10-CM

## 2020-04-15 DIAGNOSIS — D492 Neoplasm of unspecified behavior of bone, soft tissue, and skin: Secondary | ICD-10-CM

## 2020-04-15 MED ORDER — MUPIROCIN 2 % EX OINT: TOPICAL_OINTMENT | CUTANEOUS | 3 refills | Status: DC

## 2020-04-15 NOTE — Progress Notes (Signed)
Follow-Up Visit   Subjective  Gregory Padilla is a 86 y.o. male who presents for the following: growth (R temple >24m, scratched it), check spot (L foot, ~57m, painful, Keflex didnt help, mupirocin didnt help), check spot (back, ~37yr itchy), and sores (buttocks, ). No open sores on buttocks yet, just tender pink areas   The following portions of the chart were reviewed this encounter and updated as appropriate:      Review of Systems:  No other skin or systemic complaints except as noted in HPI or Assessment and Plan.  Objective  Well appearing patient in no apparent distress; mood and affect are within normal limits.  A focused examination was performed including face, scalp, back, L foot. Relevant physical exam findings are noted in the Assessment and Plan.  Objective  Right Postauricular ear in lower crease: Pink crusted lobulated pearly plaque 2.0 x 0.8cm     Objective  Right Temple x 1: Erythematous keratotic or waxy stuck-on papule or plaque.   Objective  Right paraspinal upper back: 1.7 x 1.0cm pink brown pearly plaque     Objective  Left Lateral Plantar foot, R lateral plantar foot: Firm tender nodule, similar nodule of R foot   Assessment & Plan    Seborrheic Keratoses - Stuck-on, waxy, tan-brown papules and plaques  - Discussed benign etiology and prognosis. - Observe - Call for any changes  Actinic Damage - diffuse scaly erythematous macules with underlying dyspigmentation - Recommend daily broad spectrum sunscreen SPF 30+ to sun-exposed areas, reapply every 2 hours as needed.  - Call for new or changing lesions.  Purpura - Violaceous macules and patches - Benign - Related to age, sun damage and/or use of blood thinners - Observe - Can use OTC arnica containing moisturizer such as Dermend Bruise Formula if desired - Call for worsening or other concerns   Basal cell carcinoma (BCC) of scalp Right Postauricular ear in lower  crease  Recurrent (Bx and ED&C 03/16/2018)  Recommend MOHs, will schedule pt at the [facility] in [place]  Other Related Procedures Ambulatory referral to Dermatology  Inflamed seborrheic keratosis Right Temple x 1  Destruction of lesion - Right Temple x 1  Destruction method: cryotherapy   Informed consent: discussed and consent obtained   Lesion destroyed using liquid nitrogen: Yes   Region frozen until ice ball extended beyond lesion: Yes   Outcome: patient tolerated procedure well with no complications   Post-procedure details: wound care instructions given    Neoplasm of skin Right paraspinal upper back  Epidermal / dermal shaving  Lesion diameter (cm):  1.7 Informed consent: discussed and consent obtained   Timeout: patient name, date of birth, surgical site, and procedure verified   Procedure prep:  Patient was prepped and draped in usual sterile fashion Prep type:  Isopropyl alcohol Anesthesia: the lesion was anesthetized in a standard fashion   Anesthetic:  1% lidocaine w/ epinephrine 1-100,000 buffered w/ 8.4% NaHCO3 Instrument used: flexible razor blade   Hemostasis achieved with: pressure, aluminum chloride and electrodesiccation   Outcome: patient tolerated procedure well   Post-procedure details: sterile dressing applied and wound care instructions given   Post-procedure details comment:  Ointment and small bandage applied Dressing type: bandage and petrolatum   Additional details:  Post txt defect 2.3 x 1.8  Destruction of lesion  Destruction method: electrodesiccation and curettage   Informed consent: discussed and consent obtained   Timeout:  patient name, date of birth, surgical site, and procedure  verified Anesthesia: the lesion was anesthetized in a standard fashion   Anesthetic:  1% lidocaine w/ epinephrine 1-100,000 buffered w/ 8.4% NaHCO3 Curettage performed in three different directions: Yes   Electrodesiccation performed over the  curetted area: Yes   Lesion length (cm):  1.7 Lesion width (cm):  1 Margin per side (cm):  0.3 Final wound size (cm):  2.3 Hemostasis achieved with:  pressure, aluminum chloride and electrodesiccation Outcome: patient tolerated procedure well with no complications   Post-procedure details: wound care instructions given    Specimen 1 - Surgical pathology Differential Diagnosis: D48.5 R/O BCC Check Margins: No 1.7 x 1.0cm pink brown pearly plaque EDC today  Pressure injury of left foot, stage 2 (HCC) Left Lateral Plantar foot, R lateral plantar foot  Pressure Point  Cont callus pad, and avoid pressure  Preventative care for potential pressure injury to buttocks, recommend Roho cushion for wheelchair, recommend position change frequently  Return in about 6 months (around 10/16/2020).   I, Othelia Pulling, RMA, am acting as scribe for Brendolyn Patty, MD . Documentation: I have reviewed the above documentation for accuracy and completeness, and I agree with the above.  Brendolyn Patty MD

## 2020-04-15 NOTE — Addendum Note (Signed)
Addended by: Graceann Congress D on: 04/15/2020 03:13 PM   Modules accepted: Orders

## 2020-04-15 NOTE — Patient Instructions (Signed)

## 2020-04-20 ENCOUNTER — Telehealth: Payer: Self-pay

## 2020-04-20 NOTE — Telephone Encounter (Signed)
Advised pts son of bx results/sh 

## 2020-04-20 NOTE — Telephone Encounter (Signed)
-----   Message from Brendolyn Patty, MD sent at 04/20/2020  9:38 AM EDT ----- Skin , right paraspinal upper back BASAL CELL CARCINOMA, NODULAR PATTERN  BCC, already treated with [facility]

## 2020-04-21 ENCOUNTER — Ambulatory Visit: Payer: Medicare Other | Admitting: Podiatry

## 2020-05-26 ENCOUNTER — Ambulatory Visit (INDEPENDENT_AMBULATORY_CARE_PROVIDER_SITE_OTHER): Payer: Medicare Other | Admitting: Podiatry

## 2020-05-26 ENCOUNTER — Other Ambulatory Visit: Payer: Self-pay

## 2020-05-26 ENCOUNTER — Ambulatory Visit: Payer: Medicare Other | Admitting: Podiatry

## 2020-05-26 DIAGNOSIS — M79672 Pain in left foot: Secondary | ICD-10-CM | POA: Diagnosis not present

## 2020-05-26 DIAGNOSIS — I739 Peripheral vascular disease, unspecified: Secondary | ICD-10-CM

## 2020-05-26 DIAGNOSIS — L97521 Non-pressure chronic ulcer of other part of left foot limited to breakdown of skin: Secondary | ICD-10-CM | POA: Diagnosis not present

## 2020-05-26 MED ORDER — MUPIROCIN 2 % EX OINT: TOPICAL_OINTMENT | CUTANEOUS | 3 refills | Status: DC

## 2020-05-26 NOTE — Progress Notes (Signed)
HPI: 86 y.o. male presenting today for follow-up evaluation of multiple wounds to the lateral aspect of the left foot.  The patient and his son are present today and they have not noticed any significant improvement of the lesion.  They have been applying mupirocin ointment as directed daily to the wound.  Patient continues to experience pain especially when he states that he sleeps on his left side this applies pressure and he gets the pain.  He has been applying the mupirocin which she states does help.  The wounds are very stable although there is no improvement they are very superficial in nature.  No new complaints at this time  Past Medical History:  Diagnosis Date  . Anemia    while in the [facility] as a young man  . Arthritis   . Basal cell carcinoma 05/16/2017   R post auricular  . Basal cell carcinoma 03/16/2018   R postauricualr ear in lower crease  . BPH (benign prostatic hyperplasia)   . Carotid artery occlusion   . Coccidioidomycosis   . Constipation   . CVA (cerebral vascular accident) ([place])    left sided weakness  . Degenerative joint disease   . Diabetes mellitus age 60   no insulin  . Hyperlipidemia   . Hypertension   . Meniere disease   . Skin cancer    history of skin cancer  . Stroke [facility])      Physical Exam: General: The patient is alert and oriented x3 in no acute distress.  Dermatology: Skin is very cold to touch.  Ulcer noted to the fifth metatarsal tubercle left foot as well as an ulcer just distal to the lateral malleolus of the left ankle.  Both ulcers appear stable and clinically appear the same.  There is no drainage.  No malodor noted.  And there is no periwound erythema that would indicate infection.  Wound base is granular  Vascular: Capillary refill delayed greater than 5 seconds.  Diminished pedal pulses bilateral.  The feet are very cold to touch bilateral.  Patient is being actively managed and treated at [facility] in  [facility]  Neurological: Epicritic and protective threshold grossly intact bilaterally.   Musculoskeletal Exam: Range of motion within normal limits to all pedal and ankle joints bilateral. Muscle strength 5/5 in all groups bilateral.   Radiographic Exam impression 03/10/2020:  Normal osseous mineralization. Joint spaces preserved. No fracture/dislocation/boney destruction.  There is no evidence of osteolytic destruction or osteomyelitis specifically to the fifth metatarsal tubercle  Assessment: 1.  Ulcer left foot and ankle secondary to vascular compromise and PVD 2.  PVD bilateral lower extremities   Plan of Care:  1. Patient evaluated.   2.  Light debridement was performed using a dry gauze. 3.  Continue mupirocin ointment daily.  Refill prescription sent to the pharmacy  4.  Continue offloading pads to the fifth metatarsal tubercle.   5.  Continue vascular management by vein and vascular, Dr. Donzetta Matters 6.  At this point the wounds appear to be very stable and chronic in nature.  I doubt if they will heal completely given the patient's history of PVD.  Return to clinic as needed or if there is a change in the appearance and stability of the wounds.     Edrick Kins, DPM [facility]  Dr. Edrick Kins, DPM    [address]  [place], [place] [address]                Office [phone]  Fax [phone]

## 2020-05-28 ENCOUNTER — Telehealth: Payer: Self-pay

## 2020-05-28 ENCOUNTER — Other Ambulatory Visit: Payer: Self-pay

## 2020-05-28 DIAGNOSIS — M7989 Other specified soft tissue disorders: Secondary | ICD-10-CM

## 2020-05-28 NOTE — Telephone Encounter (Signed)
Pt's son Chriss Czar called regarding scheduling an appt for pt to be seen with c/o bilateral leg and foot swelling and cold to touch. Pt has a non healing sore that has been followed by [facility] and podiatry. Son would like pt to be seen here. Pt has been scheduled to see MD with ABI's.

## 2020-05-29 ENCOUNTER — Encounter: Payer: Self-pay | Admitting: Vascular Surgery

## 2020-05-29 ENCOUNTER — Other Ambulatory Visit: Payer: Self-pay

## 2020-05-29 ENCOUNTER — Ambulatory Visit (HOSPITAL_COMMUNITY)
Admission: RE | Admit: 2020-05-29 | Discharge: 2020-05-29 | Disposition: A | Discharge status: Home / Self Care | Payer: Medicare Other | Source: Ambulatory Visit | Attending: Vascular Surgery | Admitting: Vascular Surgery

## 2020-05-29 ENCOUNTER — Ambulatory Visit (INDEPENDENT_AMBULATORY_CARE_PROVIDER_SITE_OTHER): Payer: Medicare Other | Admitting: Vascular Surgery

## 2020-05-29 VITALS — BP 117/69 | HR 57 | Temp 97.4°F | Resp 20 | Ht 74.0 in | Wt 180.0 lb

## 2020-05-29 DIAGNOSIS — M7989 Other specified soft tissue disorders: Secondary | ICD-10-CM

## 2020-05-29 DIAGNOSIS — I6522 Occlusion and stenosis of left carotid artery: Secondary | ICD-10-CM

## 2020-05-29 DIAGNOSIS — I739 Peripheral vascular disease, unspecified: Secondary | ICD-10-CM

## 2020-05-29 NOTE — Progress Notes (Signed)
Patient ID: Gregory Padilla, male   DOB: 07/19/1925, 86 y.o.   MRN: [id number]  Reason for Consult: Follow-up   Referred by [place] Pink, MD  Subjective:     HPI:  Gregory Padilla is a 86 y.o. male previously has a left transcarotid artery stent performed for symptomatic disease.  He is now sent for evaluation of left foot wound.  He believes this initially started with hitting it on the wheelchair.  He has never had lower extremity intervention in the past.  He does not walk currently other than just to transfer due to unsteady gait after many falls.  He does not have any fevers.  He does have cold hands and feet remains cold most of the time.  Does not have any worsening wounds has been followed by podiatry for this.  Past Medical History:  Diagnosis Date  . Anemia    while in the [facility] as a young man  . Arthritis   . Basal cell carcinoma 05/16/2017   R post auricular  . Basal cell carcinoma 03/16/2018   R postauricualr ear in lower crease  . BPH (benign prostatic hyperplasia)   . Carotid artery occlusion   . Coccidioidomycosis   . Constipation   . CVA (cerebral vascular accident) ([place])    left sided weakness  . Degenerative joint disease   . Diabetes mellitus age 44   no insulin  . Hyperlipidemia   . Hypertension   . Meniere disease   . Skin cancer    history of skin cancer  . Stroke [facility])    Family History  Problem Relation Age of Onset  . Diabetes Mother   . Arthritis Mother   . Coronary artery disease Father   . Heart disease Father   . Stroke Neg Hx    Past Surgical History:  Procedure Laterality Date  . BACK SURGERY  2004  . CHOLECYSTECTOMY    . PROSTATE SURGERY     x2  . TRANSCAROTID ARTERY REVASCULARIZATION Left 12/13/2018   Procedure: TRANSCAROTID ARTERY REVASCULARIZATION;  Surgeon: Waynetta Sandy, MD;  Location: [facility];  Service: Vascular;  Laterality: Left;    Short Social History:  Social History   Tobacco Use  .  Smoking status: Never Smoker  . Smokeless tobacco: Never Used  Substance Use Topics  . Alcohol use: No    Alcohol/week: 0.0 standard drinks    Allergies  Allergen Reactions  . Amitriptyline     unknown    Current Outpatient Medications  Medication Sig Dispense Refill  . acetaminophen (TYLENOL) 500 MG tablet Take 1,000 mg by mouth every 6 (six) hours as needed for moderate pain.     Marland Kitchen ALPRAZolam (XANAX) 0.25 MG tablet Take 0.0625 mg by mouth at bedtime as needed for anxiety.     . Ascorbic Acid (VITAMIN C PO) Take 1,000 mg by mouth daily.     . calcium citrate-vitamin D 200-200 MG-UNIT TABS Take 1 tablet by mouth 2 (two) times daily.      . cephALEXin (KEFLEX) 500 MG capsule Take 500 mg by mouth 3 (three) times daily.    . clopidogrel (PLAVIX) 75 MG tablet Take 75 mg by mouth daily.      Marland Kitchen donepezil (ARICEPT) 10 MG tablet SMARTSIG:Tablet(s) By Mouth    . DULoxetine (CYMBALTA) 30 MG capsule Take 30 mg by mouth daily.    Marland Kitchen esomeprazole (NEXIUM) 40 MG capsule Take 40 mg by mouth daily before breakfast.      .  ipratropium (ATROVENT) 0.06 % nasal spray Place 1 spray into both nostrils 2 (two) times daily as needed for rhinitis.     . Melatonin 10 MG TABS Take 10 mg by mouth at bedtime.    . memantine (NAMENDA) 10 MG tablet Take 10 mg by mouth 2 (two) times daily.    . mirabegron ER (MYRBETRIQ) 50 MG TB24 tablet Take 50 mg by mouth 2 (two) times daily.     . Multiple Vitamin (MULTIVITAMIN WITH MINERALS) TABS tablet Take 1 tablet by mouth daily.    . mupirocin ointment (BACTROBAN) 2 % SMARTSIG:1 Application Topical 2  Times Daily 30 g 3  . Potassium 99 MG TABS Take 99 mg by mouth daily.    . psyllium (METAMUCIL) 58.6 % powder Take 1 packet by mouth 2 (two) times daily.     . rosuvastatin (CRESTOR) 5 MG tablet Take 5 mg by mouth daily.      . traZODone (DESYREL) 50 MG tablet Take 50 mg by mouth at bedtime.     No current facility-administered medications for this visit.    Review of  Systems  Constitutional:  Constitutional negative. HENT: HENT negative.  Eyes: Eyes negative.  Respiratory: Respiratory negative.  Cardiovascular: Cardiovascular negative.  GI: Gastrointestinal negative.  Musculoskeletal: Musculoskeletal negative.  Skin: Positive for wound.  Neurological: Neurological negative. Hematologic: Hematologic/lymphatic negative.  Psychiatric: Psychiatric negative.        Objective:  Objective   Vitals:   05/29/20 1435  BP: 117/69  Pulse: (!) 57  Resp: 20  Temp: (!) 97.4 F (36.3 C)  SpO2: 98%  Weight: 180 lb (81.6 kg)  Height: 6\' 2"  (1.88 m)   Body mass index is 23.11 kg/m.  Physical Exam HENT:     Head: Normocephalic.     Nose:     Comments: Wearing a mask Eyes:     Pupils: Pupils are equal, round, and reactive to light.  Cardiovascular:     Rate and Rhythm: Normal rate.     Pulses:          Popliteal pulses are 2+ on the right side and 2+ on the left side.       Dorsalis pedis pulses are 1+ on the left side.       Posterior tibial pulses are 2+ on the right side and 1+ on the left side.  Pulmonary:     Effort: Pulmonary effort is normal.  Abdominal:     General: Abdomen is flat.     Palpations: Abdomen is soft. There is no mass.  Musculoskeletal:        General: No swelling.  Skin:    General: Skin is warm and dry.     Capillary Refill: Capillary refill takes less than 2 seconds.     Comments: Purple discolored left toes with brisk capillary refill  Neurological:     General: No focal deficit present.     Mental Status: He is alert.  Psychiatric:        Mood and Affect: Mood normal.        Behavior: Behavior normal.        Thought Content: Thought content normal.        Judgment: Judgment normal.     Data: I have independently interpreted his ABIs to be greater than 1 and triphasic bilaterally with toe pressure on the right 149 and left 153.  Assessment/Plan:     86 year old male with nonhealing left lateral foot  ulcer. Appears to  be small vessel disease. He will f/u in 8 months with carotid duplex and ABI.     Waynetta Sandy MD [facility]

## 2020-06-02 ENCOUNTER — Other Ambulatory Visit: Payer: Self-pay | Admitting: *Deleted

## 2020-06-02 DIAGNOSIS — I739 Peripheral vascular disease, unspecified: Secondary | ICD-10-CM

## 2020-06-02 DIAGNOSIS — I6523 Occlusion and stenosis of bilateral carotid arteries: Secondary | ICD-10-CM

## 2020-06-30 ENCOUNTER — Telehealth: Payer: Self-pay

## 2020-06-30 NOTE — Telephone Encounter (Signed)
Taked with patients son to confirm appt for 07/01/20. He will be coming with mr Gregory Padilla . They have h&p formed filled out and will also bring current medications with them.

## 2020-07-01 ENCOUNTER — Encounter: Payer: Self-pay | Admitting: Student in an Organized Health Care Education/Training Program

## 2020-07-01 ENCOUNTER — Ambulatory Visit
Payer: No Typology Code available for payment source | Attending: Student in an Organized Health Care Education/Training Program | Admitting: Student in an Organized Health Care Education/Training Program

## 2020-07-01 ENCOUNTER — Other Ambulatory Visit: Payer: Self-pay

## 2020-07-01 VITALS — BP 116/58 | HR 52 | Temp 97.2°F | Resp 18 | Ht 68.0 in | Wt 180.0 lb

## 2020-07-01 DIAGNOSIS — M48062 Spinal stenosis, lumbar region with neurogenic claudication: Secondary | ICD-10-CM | POA: Diagnosis not present

## 2020-07-01 DIAGNOSIS — R531 Weakness: Secondary | ICD-10-CM | POA: Insufficient documentation

## 2020-07-01 DIAGNOSIS — M5136 Other intervertebral disc degeneration, lumbar region: Secondary | ICD-10-CM | POA: Insufficient documentation

## 2020-07-01 DIAGNOSIS — G8929 Other chronic pain: Secondary | ICD-10-CM | POA: Diagnosis present

## 2020-07-01 DIAGNOSIS — M47816 Spondylosis without myelopathy or radiculopathy, lumbar region: Secondary | ICD-10-CM | POA: Diagnosis not present

## 2020-07-01 DIAGNOSIS — G459 Transient cerebral ischemic attack, unspecified: Secondary | ICD-10-CM | POA: Diagnosis present

## 2020-07-01 DIAGNOSIS — G894 Chronic pain syndrome: Secondary | ICD-10-CM | POA: Diagnosis present

## 2020-07-01 DIAGNOSIS — I639 Cerebral infarction, unspecified: Secondary | ICD-10-CM | POA: Insufficient documentation

## 2020-07-01 DIAGNOSIS — M5416 Radiculopathy, lumbar region: Secondary | ICD-10-CM | POA: Insufficient documentation

## 2020-07-01 NOTE — Patient Instructions (Signed)
Pain Management Discharge Instructions  General Discharge Instructions :  If you need to reach your doctor call: Monday-Friday 8:00 am - 4:00 pm at [phone] or toll free [phone].  After clinic hours [phone] to have operator reach doctor.  Bring all of your medication bottles to all your appointments in the pain clinic.  To cancel or reschedule your appointment with Pain Management please remember to call 24 hours in advance to avoid a fee.  Refer to the educational materials which you have been given on: General Risks, I had my Procedure. Discharge Instructions, Post Sedation.  Post Procedure Instructions:  The drugs you were given will stay in your system until tomorrow, so for the next 24 hours you should not drive, make any legal decisions or drink any alcoholic beverages.  You may eat anything you prefer, but it is better to start with liquids then soups and crackers, and gradually work up to solid foods.  Please notify your doctor immediately if you have any unusual bleeding, trouble breathing or pain that is not related to your normal pain.  Depending on the type of procedure that was done, some parts of your body may feel week and/or numb.  This usually clears up by tonight or the next day.  Walk with the use of an assistive device or accompanied by an adult for the 24 hours.  You may use ice on the affected area for the first 24 hours.  Put ice in a Ziploc bag and cover with a towel and place against area 15 minutes on 15 minutes off.  You may switch to heat after 24 hours.GENERAL RISKS AND COMPLICATIONS  What are the risk, side effects and possible complications? Generally speaking, most procedures are safe.  However, with any procedure there are risks, side effects, and the possibility of complications.  The risks and complications are dependent upon the sites that are lesioned, or the type of nerve block to be performed.  The closer the procedure is to the spine,  the more serious the risks are.  Great care is taken when placing the radio frequency needles, block needles or lesioning probes, but sometimes complications can occur. 1. Infection: Any time there is an injection through the skin, there is a risk of infection.  This is why sterile conditions are used for these blocks.  There are four possible types of infection. 1. Localized skin infection. 2. Central Nervous System Infection-This can be in the form of Meningitis, which can be deadly. 3. Epidural Infections-This can be in the form of an epidural abscess, which can cause pressure inside of the spine, causing compression of the spinal cord with subsequent paralysis. This would require an emergency surgery to decompress, and there are no guarantees that the patient would recover from the paralysis. 4. Discitis-This is an infection of the intervertebral discs.  It occurs in about 1% of discography procedures.  It is difficult to treat and it may lead to surgery.        2. Pain: the needles have to go through skin and soft tissues, will cause soreness.       3. Damage to internal structures:  The nerves to be lesioned may be near blood vessels or    other nerves which can be potentially damaged.       4. Bleeding: Bleeding is more common if the patient is taking blood thinners such as  aspirin, Coumadin, Ticiid, Plavix, etc., or if he/she have some genetic predisposition  such as   hemophilia. Bleeding into the spinal canal can cause compression of the spinal  cord with subsequent paralysis.  This would require an emergency surgery to  decompress and there are no guarantees that the patient would recover from the  paralysis.       5. Pneumothorax:  Puncturing of a lung is a possibility, every time a needle is introduced in  the area of the chest or upper back.  Pneumothorax refers to free air around the  collapsed lung(s), inside of the thoracic cavity (chest cavity).  Another two possible  complications  related to a similar event would include: Hemothorax and Chylothorax.   These are variations of the Pneumothorax, where instead of air around the collapsed  lung(s), you may have blood or chyle, respectively.       6. Spinal headaches: They may occur with any procedures in the area of the spine.       7. Persistent CSF (Cerebro-Spinal Fluid) leakage: This is a rare problem, but may occur  with prolonged intrathecal or epidural catheters either due to the formation of a fistulous  track or a dural tear.       8. Nerve damage: By working so close to the spinal cord, there is always a possibility of  nerve damage, which could be as serious as a permanent spinal cord injury with  paralysis.       9. Death:  Although rare, severe deadly allergic reactions known as "Anaphylactic  reaction" can occur to any of the medications used.      10. Worsening of the symptoms:  We can always make thing worse.  What are the chances of something like this happening? Chances of any of this occuring are extremely low.  By statistics, you have more of a chance of getting killed in a motor vehicle accident: while driving to the hospital than any of the above occurring .  Nevertheless, you should be aware that they are possibilities.  In general, it is similar to taking a shower.  Everybody knows that you can slip, hit your head and get killed.  Does that mean that you should not shower again?  Nevertheless always keep in mind that statistics do not mean anything if you happen to be on the wrong side of them.  Even if a procedure has a 1 (one) in a 1,000,000 (million) chance of going wrong, it you happen to be that one..Also, keep in mind that by statistics, you have more of a chance of having something go wrong when taking medications.  Who should not have this procedure? If you are on a blood thinning medication (e.g. Coumadin, Plavix, see list of "Blood Thinners"), or if you have an active infection going on, you should not  have the procedure.  If you are taking any blood thinners, please inform your physician.  How should I prepare for this procedure?  Do not eat or drink anything at least six hours prior to the procedure.  Bring a driver with you .  It cannot be a taxi.  Come accompanied by an adult that can drive you back, and that is strong enough to help you if your legs get weak or numb from the local anesthetic.  Take all of your medicines the morning of the procedure with just enough water to swallow them.  If you have diabetes, make sure that you are scheduled to have your procedure done first thing in the morning, whenever possible.  If you have diabetes,   take only half of your insulin dose and notify our nurse that you have done so as soon as you arrive at the clinic.  If you are diabetic, but only take blood sugar pills (oral hypoglycemic), then do not take them on the morning of your procedure.  You may take them after you have had the procedure.  Do not take aspirin or any aspirin-containing medications, at least eleven (11) days prior to the procedure.  They may prolong bleeding.  Wear loose fitting clothing that may be easy to take off and that you would not mind if it got stained with Betadine or blood.  Do not wear any jewelry or perfume  Remove any nail coloring.  It will interfere with some of our monitoring equipment.  NOTE: Remember that this is not meant to be interpreted as a complete list of all possible complications.  Unforeseen problems may occur.  BLOOD THINNERS The following drugs contain aspirin or other products, which can cause increased bleeding during surgery and should not be taken for 2 weeks prior to and 1 week after surgery.  If you should need take something for relief of minor pain, you may take acetaminophen which is found in Tylenol,m Datril, Anacin-3 and Panadol. It is not blood thinner. The products listed below are.  Do not take any of the products listed below  in addition to any listed on your instruction sheet.  A.P.C or A.P.C with Codeine Codeine Phosphate Capsules #3 Ibuprofen Ridaura  ABC compound Congesprin Imuran rimadil  Advil Cope Indocin Robaxisal  Alka-Seltzer Effervescent Pain Reliever and Antacid Coricidin or Coricidin-D  Indomethacin Rufen  Alka-Seltzer plus Cold Medicine Cosprin Ketoprofen S-A-C Tablets  Anacin Analgesic Tablets or Capsules Coumadin Korlgesic Salflex  Anacin Extra Strength Analgesic tablets or capsules CP-2 Tablets Lanoril Salicylate  Anaprox Cuprimine Capsules Levenox Salocol  Anexsia-D Dalteparin Magan Salsalate  Anodynos Darvon compound Magnesium Salicylate Sine-off  Ansaid Dasin Capsules Magsal Sodium Salicylate  Anturane Depen Capsules Marnal Soma  APF Arthritis pain formula Dewitt's Pills Measurin Stanback  Argesic Dia-Gesic Meclofenamic Sulfinpyrazone  Arthritis Bayer Timed Release Aspirin Diclofenac Meclomen Sulindac  Arthritis pain formula Anacin Dicumarol Medipren Supac  Analgesic (Safety coated) Arthralgen Diffunasal Mefanamic Suprofen  Arthritis Strength Bufferin Dihydrocodeine Mepro Compound Suprol  Arthropan liquid Dopirydamole Methcarbomol with Aspirin Synalgos  ASA tablets/Enseals Disalcid Micrainin Tagament  Ascriptin Doan's Midol Talwin  Ascriptin A/D Dolene Mobidin Tanderil  Ascriptin Extra Strength Dolobid Moblgesic Ticlid  Ascriptin with Codeine Doloprin or Doloprin with Codeine Momentum Tolectin  Asperbuf Duoprin Mono-gesic Trendar  Aspergum Duradyne Motrin or Motrin IB Triminicin  Aspirin plain, buffered or enteric coated Durasal Myochrisine Trigesic  Aspirin Suppositories Easprin Nalfon Trillsate  Aspirin with Codeine Ecotrin Regular or Extra Strength Naprosyn Uracel  Atromid-S Efficin Naproxen Ursinus  Auranofin Capsules Elmiron Neocylate Vanquish  Axotal Emagrin Norgesic Verin  Azathioprine Empirin or Empirin with Codeine Normiflo Vitamin E  Azolid Emprazil Nuprin Voltaren  Bayer  Aspirin plain, buffered or children's or timed BC Tablets or powders Encaprin Orgaran Warfarin Sodium  Buff-a-Comp Enoxaparin Orudis Zorpin  Buff-a-Comp with Codeine Equegesic Os-Cal-Gesic   Buffaprin Excedrin plain, buffered or Extra Strength Oxalid   Bufferin Arthritis Strength Feldene Oxphenbutazone   Bufferin plain or Extra Strength Feldene Capsules Oxycodone with Aspirin   Bufferin with Codeine Fenoprofen Fenoprofen Pabalate or Pabalate-SF   Buffets II Flogesic Panagesic   Buffinol plain or Extra Strength Florinal or Florinal with Codeine Panwarfarin   Buf-Tabs Flurbiprofen Penicillamine   Butalbital Compound Four-way cold tablets   Penicillin   Butazolidin Fragmin Pepto-Bismol   Carbenicillin Geminisyn Percodan   Carna Arthritis Reliever Geopen Persantine   Carprofen Gold's salt Persistin   Chloramphenicol Goody's Phenylbutazone   Chloromycetin Haltrain Piroxlcam   Clmetidine heparin Plaquenil   Cllnoril Hyco-pap Ponstel   Clofibrate Hydroxy chloroquine Propoxyphen         Before stopping any of these medications, be sure to consult the physician who ordered them.  Some, such as Coumadin (Warfarin) are ordered to prevent or treat serious conditions such as "deep thrombosis", "pumonary embolisms", and other heart problems.  The amount of time that you may need off of the medication may also vary with the medication and the reason for which you were taking it.  If you are taking any of these medications, please make sure you notify your pain physician before you undergo any procedures.         Epidural Steroid Injection Patient Information  Description: The epidural space surrounds the nerves as they exit the spinal cord.  In some patients, the nerves can be compressed and inflamed by a bulging disc or a tight spinal canal (spinal stenosis).  By injecting steroids into the epidural space, we can bring irritated nerves into direct contact with a potentially helpful medication.   These steroids act directly on the irritated nerves and can reduce swelling and inflammation which often leads to decreased pain.  Epidural steroids may be injected anywhere along the spine and from the neck to the low back depending upon the location of your pain.   After numbing the skin with local anesthetic (like Novocaine), a small needle is passed into the epidural space slowly.  You may experience a sensation of pressure while this is being done.  The entire block usually last less than 10 minutes.  Conditions which may be treated by epidural steroids:   Low back and leg pain  Neck and arm pain  Spinal stenosis  Post-laminectomy syndrome  Herpes zoster (shingles) pain  Pain from compression fractures  Preparation for the injection:  1. Do not eat any solid food or dairy products within 8 hours of your appointment.  2. You may drink clear liquids up to 3 hours before appointment.  Clear liquids include water, black coffee, juice or soda.  No milk or cream please. 3. You may take your regular medication, including pain medications, with a sip of water before your appointment  Diabetics should hold regular insulin (if taken separately) and take 1/2 normal NPH dos the morning of the procedure.  Carry some sugar containing items with you to your appointment. 4. A driver must accompany you and be prepared to drive you home after your procedure.  5. Bring all your current medications with your. 6. An IV may be inserted and sedation may be given at the discretion of the physician.   7. A blood pressure cuff, EKG and other monitors will often be applied during the procedure.  Some patients may need to have extra oxygen administered for a short period. 8. You will be asked to provide medical information, including your allergies, prior to the procedure.  We must know immediately if you are taking blood thinners (like Coumadin/Warfarin)  Or if you are allergic to IV iodine contrast (dye). We  must know if you could possible be pregnant.  Possible side-effects:  Bleeding from needle site  Infection (rare, may require surgery)  Nerve injury (rare)  Numbness & tingling (temporary)  Difficulty urinating (rare, temporary)  Spinal headache (   a headache worse with upright posture)  Light -headedness (temporary)  Pain at injection site (several days)  Decreased blood pressure (temporary)  Weakness in arm/leg (temporary)  Pressure sensation in back/neck (temporary)  Call if you experience:  Fever/chills associated with headache or increased back/neck pain.  Headache worsened by an upright position.  New onset weakness or numbness of an extremity below the injection site  Hives or difficulty breathing (go to the emergency room)  Inflammation or drainage at the infection site  Severe back/neck pain  Any new symptoms which are concerning to you  Please note:  Although the local anesthetic injected can often make your back or neck feel good for several hours after the injection, the pain will likely return.  It takes 3-7 days for steroids to work in the epidural space.  You may not notice any pain relief for at least that one week.  If effective, we will often do a series of three injections spaced 3-6 weeks apart to maximally decrease your pain.  After the initial series, we generally will wait several months before considering a repeat injection of the same type.  If you have any questions, please call [phone] [facility] 
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
## 2020-07-01 NOTE — Progress Notes (Signed)
Safety precautions to be maintained throughout the outpatient stay will include: orient to surroundings, keep bed in low position, maintain call bell within reach at all times, provide assistance with transfer out of bed and ambulation.  

## 2020-08-10 ENCOUNTER — Ambulatory Visit
Payer: No Typology Code available for payment source | Attending: Student in an Organized Health Care Education/Training Program | Admitting: Student in an Organized Health Care Education/Training Program

## 2020-08-10 ENCOUNTER — Other Ambulatory Visit: Payer: Self-pay

## 2020-08-10 DIAGNOSIS — M47816 Spondylosis without myelopathy or radiculopathy, lumbar region: Secondary | ICD-10-CM | POA: Insufficient documentation

## 2020-08-10 DIAGNOSIS — G8929 Other chronic pain: Secondary | ICD-10-CM | POA: Diagnosis present

## 2020-08-10 DIAGNOSIS — M5416 Radiculopathy, lumbar region: Secondary | ICD-10-CM | POA: Diagnosis present

## 2020-08-10 DIAGNOSIS — M48062 Spinal stenosis, lumbar region with neurogenic claudication: Secondary | ICD-10-CM | POA: Insufficient documentation

## 2020-08-10 NOTE — Patient Instructions (Signed)
____________________________________________________________________________________________  Blood Thinners  IMPORTANT NOTICE:  If you take any of these, make sure to notify the nursing staff.  Failure to do so may result in injury.  Recommended time intervals to stop and restart blood-thinners, before & after invasive procedures  Generic Name Brand Name Stop Time. Must be stopped at least this long before procedures. After procedures, wait at least this long before re-starting.  Abciximab Reopro 15 days 2 hrs  Alteplase Activase 10 days 10 days  Anagrelide Agrylin    Apixaban Eliquis 3 days 6 hrs  Cilostazol Pletal 3 days 5 hrs  Clopidogrel Plavix 7-10 days 2 hrs  Dabigatran Pradaxa 5 days 6 hrs  Dalteparin Fragmin 24 hours 4 hrs  Dipyridamole Aggrenox 11days 2 hrs  Edoxaban Lixiana; Savaysa 3 days 2 hrs  Enoxaparin  Lovenox 24 hours 4 hrs  Eptifibatide Integrillin 8 hours 2 hrs  Fondaparinux  Arixtra 72 hours 12 hrs  Prasugrel Effient 7-10 days 6 hrs  Reteplase Retavase 10 days 10 days  Rivaroxaban Xarelto 3 days 6 hrs  Ticagrelor Brilinta 5-7 days 6 hrs  Ticlopidine Ticlid 10-14 days 2 hrs  Tinzaparin Innohep 24 hours 4 hrs  Tirofiban Aggrastat 8 hours 2 hrs  Warfarin Coumadin 5 days 2 hrs   Other medications with blood-thinning effects  Product indications Generic (Brand) names Note  Cholesterol Lipitor Stop 4 days before procedure  Blood thinner (injectable) Heparin (LMW or LMWH Heparin) Stop 24 hours before procedure  Cancer Ibrutinib (Imbruvica) Stop 7 days before procedure  Malaria/Rheumatoid Hydroxychloroquine (Plaquenil) Stop 11 days before procedure  Thrombolytics  10 days before or after procedures   Over-the-counter (OTC) Products with blood-thinning effects  Product Common names Stop Time  Aspirin > 325 mg Goody Powders, Excedrin, etc. 11 days  Aspirin ? 81 mg  7 days  Fish oil  4 days  Garlic supplements  7 days  Ginkgo biloba  36 hours  Ginseng  24  hours  NSAIDs Ibuprofen, Naprosyn, etc. 3 days  Vitamin E  4 days   ____________________________________________________________________________________________

## 2020-08-10 NOTE — Progress Notes (Signed)
Patient forgot to stop his Plavix 7 days prior to his scheduled procedure.  We will need to reschedule.  We will try and obtain cardiac clearance to stop Plavix 7 days prior to scheduled procedure and if patient is safely able to do that, we will have him schedule for L2-L3 epidural steroid injection or caudal ESI depending upon his interlaminar windows on the day of procedure under fluoroscopy.

## 2020-08-10 NOTE — Progress Notes (Signed)
Safety precautions to be maintained throughout the outpatient stay will include: orient to surroundings, keep bed in low position, maintain call bell within reach at all times, provide assistance with transfer out of bed and ambulation.  

## 2020-08-13 ENCOUNTER — Telehealth: Payer: Self-pay | Admitting: *Deleted

## 2020-08-13 NOTE — Telephone Encounter (Signed)
Called and spoke to Gregory Padilla, patient's son to let him know where we are on getting medical clearance for his dad's procedure.  Have sent medical clearance to Dr Manuella Ghazi and Dr Donzetta Matters per Dr Verneda Skill request.

## 2020-08-13 NOTE — Telephone Encounter (Signed)
Dr Verneda Skill office called re; medical clearance.  He feels that it would more appropriate to check with either Dr Manuella Ghazi or Dr Donzetta Matters re; stopping Plavix prior to procedure for tail bone pain.  Have sent a medical clearance to both these providers.

## 2020-08-24 ENCOUNTER — Telehealth: Payer: Self-pay | Admitting: Student in an Organized Health Care Education/Training Program

## 2020-08-24 ENCOUNTER — Telehealth: Payer: Self-pay

## 2020-08-24 NOTE — Telephone Encounter (Signed)
Attempted to call Jori Moll, no answer, voicemail not set up.

## 2020-08-24 NOTE — Telephone Encounter (Signed)
Jori Moll called PCP to ask about stopping Plavix he says we need permission from Vascular Dr. Donzetta Matters [phone] Cristie Hem is his Nurse, [phone].  Please send request to this physician about stopping Plavix for procedures

## 2020-08-24 NOTE — Telephone Encounter (Signed)
Patient's son Jori Moll calling to check on when his father can have procedure. I explained the patient needs to stop his plavix. Son is going to call patient's PCP to find out if he can stop med for procedure. Wanted to know if we had heard anything

## 2020-08-24 NOTE — Telephone Encounter (Signed)
Received call from son Gregory Padilla) patient needing clearance to get an epidural for back pain. PCP is the one who prescribed Plavix and denies permission to stop medication and defers this to vascular. I told the son that normally the prescriber of the medication is the one who gives the okay to stop it, but to have the office (the one administering the epidural) fax over the form and I would see if I could get clearance.  I currently have received no forms for this patient. 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 Son verbalized understanding.

## 2020-08-24 NOTE — Telephone Encounter (Signed)
Spoke with Gregory Padilla, he will call these physicians to ask if safe to stop Plavix 7 days.

## 2020-08-25 NOTE — Telephone Encounter (Signed)
Dr. Barbarann Ehlers office called, he is not willing to give clearance to stop Plavix. Contacted Dr. Manuella Ghazi, awaiting return call.

## 2020-08-25 NOTE — Telephone Encounter (Signed)
Dr. Claretha Cooper office called, he is not willing to give clearance to stop Plavix because he is not the prescribing physician. I called Dr. Barbarann Ehlers office, left message, requesting permission to stop Plavix.

## 2020-08-25 NOTE — Telephone Encounter (Signed)
Message left at Dr. Claretha Cooper office requesting permission to stop Plavix 7 days.

## 2020-08-26 ENCOUNTER — Telehealth: Payer: Self-pay | Admitting: *Deleted

## 2020-08-26 NOTE — Telephone Encounter (Signed)
Received permission from Dr. Manuella Ghazi to stop Plavix 7 days for procedure. [place] notified.

## 2020-08-26 NOTE — Telephone Encounter (Signed)
Received phone call from Dr Trena Platt office that it is okay to hold plavix x 7 days prior to procedure.

## 2020-09-23 ENCOUNTER — Ambulatory Visit
Payer: No Typology Code available for payment source | Attending: Student in an Organized Health Care Education/Training Program | Admitting: Student in an Organized Health Care Education/Training Program

## 2020-09-23 ENCOUNTER — Encounter: Payer: Self-pay | Admitting: Student in an Organized Health Care Education/Training Program

## 2020-09-23 ENCOUNTER — Ambulatory Visit: Admission: RE | Admit: 2020-09-23 | Payer: No Typology Code available for payment source | Source: Ambulatory Visit

## 2020-09-23 ENCOUNTER — Other Ambulatory Visit: Payer: Self-pay

## 2020-09-23 VITALS — BP 96/83 | HR 59 | Temp 96.7°F | Resp 16 | Ht 72.0 in | Wt 180.0 lb

## 2020-09-23 DIAGNOSIS — M5416 Radiculopathy, lumbar region: Secondary | ICD-10-CM

## 2020-09-23 DIAGNOSIS — G894 Chronic pain syndrome: Secondary | ICD-10-CM

## 2020-09-23 DIAGNOSIS — G8929 Other chronic pain: Secondary | ICD-10-CM

## 2020-09-23 DIAGNOSIS — M48062 Spinal stenosis, lumbar region with neurogenic claudication: Secondary | ICD-10-CM

## 2020-09-23 MED ORDER — DEXAMETHASONE SODIUM PHOSPHATE 10 MG/ML IJ SOLN
10.0000 mg | Freq: Once | INTRAMUSCULAR | Status: DC
  Filled 2020-09-23: qty 1

## 2020-09-23 MED ORDER — LIDOCAINE HCL 2 % IJ SOLN
20.0000 mL | Freq: Once | INTRAMUSCULAR | Status: DC
  Filled 2020-09-23: qty 40

## 2020-09-23 MED ORDER — ROPIVACAINE HCL 2 MG/ML IJ SOLN
2.0000 mL | Freq: Once | INTRAMUSCULAR | Status: DC
  Filled 2020-09-23: qty 10

## 2020-09-23 MED ORDER — IOHEXOL 180 MG/ML  SOLN: 10.0000 mL | Freq: Once | INTRAMUSCULAR | Status: DC

## 2020-09-23 MED ORDER — SODIUM CHLORIDE 0.9% FLUSH: 2.0000 mL | Freq: Once | INTRAVENOUS | Status: DC

## 2020-09-23 MED ORDER — SODIUM CHLORIDE (PF) 0.9 % IJ SOLN
INTRAMUSCULAR | Status: AC
  Filled 2020-09-23: qty 10

## 2020-09-23 NOTE — Patient Instructions (Signed)
Restart plavix tomorrow

## 2020-09-23 NOTE — Progress Notes (Signed)
Patient presents today for caudal epidural steroid injection.  Patient was unable to lay prone on the fluoroscopy table.  He was having severe hip pain and was unable to stay stationary.  Nursing staff was in room for an extended period of time to try and redirect and reposition patient but even then, he was unable to tolerate prone position.  Given his inability to lay stationary on the fluoroscopy table, he is at a high risk of fall and I do not feel comfortable doing a spinal injection when he is moving around so much.  Patient's son also came into the room to see if he could instruct his father and provide him with some tips to a stationary but that was unsuccessful.  From a safety standpoint, we decided to abort the procedure.  Patient instructed to restart Plavix.

## 2020-09-23 NOTE — Progress Notes (Signed)
Safety precautions to be maintained throughout the outpatient stay will include: orient to surroundings, keep bed in low position, maintain call bell within reach at all times, provide assistance with transfer out of bed and ambulation.  

## 2020-10-27 ENCOUNTER — Ambulatory Visit: Payer: Medicare Other | Admitting: Dermatology

## 2020-11-02 ENCOUNTER — Ambulatory Visit: Payer: Medicare Other | Admitting: Podiatry

## 2020-11-03 ENCOUNTER — Ambulatory Visit: Payer: Medicare Other | Admitting: Dermatology

## 2020-11-04 ENCOUNTER — Ambulatory Visit: Payer: Medicare Other | Admitting: Podiatry

## 2020-11-04 ENCOUNTER — Ambulatory Visit (INDEPENDENT_AMBULATORY_CARE_PROVIDER_SITE_OTHER): Payer: Medicare Other | Admitting: Podiatry

## 2020-11-04 ENCOUNTER — Encounter: Payer: Self-pay | Admitting: Podiatry

## 2020-11-04 ENCOUNTER — Other Ambulatory Visit: Payer: Self-pay

## 2020-11-04 VITALS — Temp 97.8°F

## 2020-11-04 DIAGNOSIS — I739 Peripheral vascular disease, unspecified: Secondary | ICD-10-CM

## 2020-11-04 DIAGNOSIS — L97521 Non-pressure chronic ulcer of other part of left foot limited to breakdown of skin: Secondary | ICD-10-CM | POA: Diagnosis not present

## 2020-11-04 DIAGNOSIS — L97522 Non-pressure chronic ulcer of other part of left foot with fat layer exposed: Secondary | ICD-10-CM

## 2020-11-04 DIAGNOSIS — L89626 Pressure-induced deep tissue damage of left heel: Secondary | ICD-10-CM

## 2020-11-04 MED ORDER — MUPIROCIN 2 % EX OINT: TOPICAL_OINTMENT | CUTANEOUS | 3 refills | Status: DC

## 2020-11-04 NOTE — Patient Instructions (Addendum)
[facility] [place] Referral was sent to:  [facility], [place],  [place]  [id number]   Call for an appointment: Main: [phone]

## 2020-11-05 NOTE — Progress Notes (Signed)
  Subjective:  Patient ID: Gregory Padilla, male    DOB: 1924/10/20,  MRN: [id number]  Chief Complaint  Patient presents with  . Foot Ulcer    Patient presents today for left great toe infection x 1 1/2 weeks.  His son says that toe has been really red, draining and patient ran low grade fever about 1 week ago.  He was given Doxycycline by Dr. Kary Kos and has 2 doses left.   He also has bruising area on left heel  He denies any pain    87 y.o. male presents with the above complaint. History confirmed with patient.  Has a history of ulceration treated by Dr. Amalia Hailey as well as peripheral vascular disease.  His son says it looks a little bit better than it has been  Objective:  Physical Exam: Foot is cool to touch with cyanotic pallor.  Nonpalpable pedal pulses.  There is a full-thickness ulceration measuring 1 cm diameter at the distal tip of the hallux with a fibrogranular base with hyperkeratotic tissue surrounding it.  Scabs over fifth metatarsal base and lateral fifth toe.  There is a deep pressure injury with bogginess and bruising on the medial heel  Assessment:   1. Ulcer of left foot, limited to breakdown of skin ([place])   2. Pressure injury of deep tissue of left heel   3. Skin ulcer of left great toe with fat layer exposed ([place])   4. PVD (peripheral vascular disease) ([place])      Plan:  Patient was evaluated and treated and all questions answered.   Ulcer left hallux -Debridement as below. -Dressed with Iodosorb, DSD. -Mupirocin ointment Rx sent -Complete doxycycline course -Referral to wound care at [facility] sent.  I do not think these have a great chance of healing considering his significant peripheral arterial disease.  I think this would likely be palliative wound care.  He also has a new deep tissue injury which I emphasized the importance of offloading at all times.  Procedure: Excisional Debridement of Wound Rationale: Removal of non-viable soft tissue from the wound to  promote healing.  Anesthesia: none Pre-Debridement Wound Measurements: Unmeasurable due to overlying hyperkeratosis and nonviable tissue Post-Debridement Wound Measurements: 1.0 cm x 1.0 cm x 0.1 cm  Type of Debridement: Sharp Excisional Tissue Removed: Non-viable soft tissue Depth of Debridement: subcutaneous tissue. Technique: Sharp excisional debridement  Dressing: Dry, sterile, compression dressing. Disposition: Patient tolerated procedure well.   No follow-ups on file.       No follow-ups on file.

## 2020-11-10 DEATH — deceased
